# Patient Record
Sex: Female | Born: 1960 | Race: White | Hispanic: No | Marital: Married | Smoking: Never smoker
Health system: Southern US, Community
[De-identification: ages and names within clinical notes are randomized; demographics above are authoritative.]

## PROBLEM LIST (undated history)

## (undated) DIAGNOSIS — Z8669 Personal history of other diseases of the nervous system and sense organs: Secondary | ICD-10-CM

## (undated) DIAGNOSIS — I10 Essential (primary) hypertension: Secondary | ICD-10-CM

## (undated) DIAGNOSIS — K209 Esophagitis, unspecified without bleeding: Secondary | ICD-10-CM

## (undated) DIAGNOSIS — M889 Osteitis deformans of unspecified bone: Secondary | ICD-10-CM

## (undated) DIAGNOSIS — R739 Hyperglycemia, unspecified: Secondary | ICD-10-CM

## (undated) DIAGNOSIS — E7800 Pure hypercholesterolemia, unspecified: Secondary | ICD-10-CM

## (undated) DIAGNOSIS — K2970 Gastritis, unspecified, without bleeding: Secondary | ICD-10-CM

## (undated) DIAGNOSIS — K3184 Gastroparesis: Secondary | ICD-10-CM

## (undated) DIAGNOSIS — E119 Type 2 diabetes mellitus without complications: Secondary | ICD-10-CM

## (undated) HISTORY — DX: Personal history of other diseases of the nervous system and sense organs: Z86.69

## (undated) HISTORY — DX: Gastroparesis: K31.84

## (undated) HISTORY — PX: TUBAL LIGATION: SHX77

## (undated) HISTORY — PX: CERVICAL DISC SURGERY: SHX588

## (undated) HISTORY — DX: Hyperglycemia, unspecified: R73.9

## (undated) HISTORY — DX: Gastritis, unspecified, without bleeding: K29.70

## (undated) HISTORY — DX: Pure hypercholesterolemia, unspecified: E78.00

## (undated) HISTORY — DX: Esophagitis, unspecified: K20.9

## (undated) HISTORY — DX: Esophagitis, unspecified without bleeding: K20.90

## (undated) HISTORY — DX: Essential (primary) hypertension: I10

---

## 2004-06-06 ENCOUNTER — Encounter: Admission: RE | Admit: 2004-06-06 | Discharge: 2004-06-06 | Payer: Self-pay | Admitting: Family Medicine

## 2004-07-12 ENCOUNTER — Encounter: Admission: RE | Admit: 2004-07-12 | Discharge: 2004-07-12 | Payer: Self-pay | Admitting: Family Medicine

## 2004-07-26 ENCOUNTER — Encounter: Admission: RE | Admit: 2004-07-26 | Discharge: 2004-07-26 | Payer: Self-pay | Admitting: Family Medicine

## 2004-08-09 ENCOUNTER — Ambulatory Visit (HOSPITAL_COMMUNITY): Admission: RE | Admit: 2004-08-09 | Discharge: 2004-08-10 | Payer: Self-pay | Admitting: Neurosurgery

## 2004-08-31 ENCOUNTER — Other Ambulatory Visit: Payer: Self-pay

## 2004-10-09 ENCOUNTER — Ambulatory Visit: Payer: Self-pay | Admitting: Internal Medicine

## 2005-11-11 ENCOUNTER — Ambulatory Visit: Payer: Self-pay | Admitting: Internal Medicine

## 2005-11-24 ENCOUNTER — Ambulatory Visit: Payer: Self-pay | Admitting: Internal Medicine

## 2005-12-09 ENCOUNTER — Ambulatory Visit: Payer: Self-pay | Admitting: Gastroenterology

## 2006-02-02 ENCOUNTER — Ambulatory Visit: Payer: Self-pay | Admitting: Gastroenterology

## 2006-02-23 ENCOUNTER — Ambulatory Visit: Payer: Self-pay | Admitting: Internal Medicine

## 2006-05-26 ENCOUNTER — Ambulatory Visit: Payer: Self-pay | Admitting: Internal Medicine

## 2007-11-03 ENCOUNTER — Ambulatory Visit: Payer: Self-pay | Admitting: Internal Medicine

## 2007-12-01 ENCOUNTER — Ambulatory Visit: Payer: Self-pay | Admitting: Gastroenterology

## 2008-01-18 ENCOUNTER — Ambulatory Visit: Payer: Self-pay | Admitting: Internal Medicine

## 2008-03-07 ENCOUNTER — Ambulatory Visit: Payer: Self-pay | Admitting: Gastroenterology

## 2008-03-21 ENCOUNTER — Ambulatory Visit: Payer: Self-pay | Admitting: Unknown Physician Specialty

## 2008-04-24 ENCOUNTER — Ambulatory Visit: Payer: Self-pay | Admitting: Gastroenterology

## 2008-12-22 HISTORY — PX: BREAST BIOPSY: SHX20

## 2009-10-01 ENCOUNTER — Emergency Department: Payer: Self-pay | Admitting: Emergency Medicine

## 2009-10-02 ENCOUNTER — Ambulatory Visit (HOSPITAL_COMMUNITY): Admission: RE | Admit: 2009-10-02 | Discharge: 2009-10-02 | Payer: Self-pay | Admitting: Neurosurgery

## 2009-10-10 ENCOUNTER — Ambulatory Visit: Payer: Self-pay | Admitting: Internal Medicine

## 2009-11-01 ENCOUNTER — Encounter: Admission: RE | Admit: 2009-11-01 | Discharge: 2009-11-01 | Payer: Self-pay | Admitting: Neurosurgery

## 2009-11-08 ENCOUNTER — Ambulatory Visit: Payer: Self-pay | Admitting: Internal Medicine

## 2009-12-04 ENCOUNTER — Ambulatory Visit: Payer: Self-pay | Admitting: Gastroenterology

## 2009-12-07 ENCOUNTER — Ambulatory Visit: Payer: Self-pay | Admitting: Gastroenterology

## 2009-12-10 ENCOUNTER — Ambulatory Visit: Payer: Self-pay | Admitting: Gastroenterology

## 2010-07-08 ENCOUNTER — Ambulatory Visit: Payer: Self-pay | Admitting: General Surgery

## 2010-11-08 ENCOUNTER — Encounter: Admission: RE | Admit: 2010-11-08 | Discharge: 2010-11-08 | Payer: Self-pay | Admitting: Neurosurgery

## 2010-12-10 ENCOUNTER — Inpatient Hospital Stay (HOSPITAL_COMMUNITY)
Admission: RE | Admit: 2010-12-10 | Discharge: 2010-12-11 | Payer: Self-pay | Source: Home / Self Care | Attending: Neurosurgery | Admitting: Neurosurgery

## 2011-02-26 ENCOUNTER — Ambulatory Visit: Payer: Self-pay | Admitting: Internal Medicine

## 2011-03-03 LAB — CBC
MCHC: 33.3 g/dL (ref 30.0–36.0)
Platelets: 270 10*3/uL (ref 150–400)
RDW: 13.8 % (ref 11.5–15.5)

## 2011-03-03 LAB — SURGICAL PCR SCREEN
MRSA, PCR: NEGATIVE
Staphylococcus aureus: NEGATIVE

## 2011-03-03 LAB — BASIC METABOLIC PANEL
BUN: 13 mg/dL (ref 6–23)
Calcium: 10.1 mg/dL (ref 8.4–10.5)
Creatinine, Ser: 0.77 mg/dL (ref 0.4–1.2)
GFR calc non Af Amer: >60 mL/min (ref >60)
Glucose, Bld: 128 mg/dL — ABNORMAL HIGH (ref 70–99)
Sodium: 144 mEq/L (ref 135–145)

## 2011-05-09 NOTE — H&P (Signed)
NAME:  Rachel Collins, Rachel Collins                          ACCOUNT NO.:  [id number]   MEDICAL RECORD NO.:  [id number]                   PATIENT TYPE:  OIB   LOCATION:  NA                                   FACILITY:  [facility]   PHYSICIAN:  Hilda Lias, M.D.                DATE OF BIRTH:  Mar 10, 1961   DATE OF ADMISSION:  08/09/2004  DATE OF DISCHARGE:                                HISTORY & PHYSICAL   Rachel Collins is a lady who was seen in my office two days ago because of neck  pain with radiation to the left upper extremity associated with weakness.  The patient is getting worse.  She had an MRI and later on a myelogram.  Because of the findings, she was sent to [place] for evaluation.  She denies any  problems with the right upper extremity.   PAST MEDICAL HISTORY:  Negative.   SOCIAL HISTORY:  She does not smoke.  She drinks socially.   FAMILY HISTORY:  Mother is 85 with heart disease and diabetes.   REVIEW OF SYSTEMS:  Positive for high blood pressure, high cholesterol.   PHYSICAL EXAMINATION:  GENERAL:  The patient came to my office, and she was  holding her left hand against the chest wall.  This was to prevent pain.  HEENT:  Nose clear.  NECK:  She is a able to flex.  Extension causes pain going to the left  shoulder.  ABDOMEN:  Normal.  EXTREMITIES:  Normal.  NEUROLOGIC:  The strength showed that she had weakness of the left triceps  with absence of left triceps reflex.  She had numbness which involved mostly  the C7 nerve root on the left side.   LABORATORY AND X-RAY DATA:  The cervical MRI and myelogram showed that she  has a herniated disk at the level of C6-7 central to the left.  She has  spondylosis at the level of 5-6.   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   RECOMMENDATIONS:  The patient wants to proceed with surgery.  The procedure  will be anterior cervical diskectomy at C6-7 with fusion.  She knows about  the  risks such as infection, CSF leak, damage to the vocal cord, damage to  the esophageal tract, collapse of the bone graft, and need for further  surgery.                                                Hilda Lias, M.D.    EB/MEDQ  D:  08/09/2004  T:  08/09/2004  Job:  [id number]

## 2011-05-09 NOTE — Op Note (Signed)
NAME:  Rachel Collins, Rachel Collins                          ACCOUNT NO.:  [id number]   MEDICAL RECORD NO.:  [id number]                   PATIENT TYPE:  OIB   LOCATION:  NA                                   FACILITY:  [facility]   PHYSICIAN:  Hilda Lias, M.D.                DATE OF BIRTH:  03/18/1961   DATE OF PROCEDURE:  08/09/2004  DATE OF DISCHARGE:                                 OPERATIVE REPORT   PREOPERATIVE DIAGNOSIS:  C6-7 herniated disk with left cervical  radiculopathy.   POSTOPERATIVE DIAGNOSIS:  C6-7 herniated disk with left cervical  radiculopathy.   OPERATION/PROCEDURE:  C6-7 decompression of the spinal cord, total  diskectomy, foraminotomy, removal of three large fragments on the left side,  bone graft, allograft, plate, microscopic.   INDICATIONS:  The patient was admitted because of neck __________.  X-rays  showed that she has herniated disk of the lower C6-C7, centered to the left.  The patient has failed conservative treatment.  The patient wanted to  proceed with surgery and the risks were explained and she wished to proceed.   DESCRIPTION OF PROCEDURE:  The patient was taken to the OR and the left side  of the neck was prepped with Betadine.  Transverse incision through the skin  and subcutaneous tissue was made.  Dissection was carried out to the  cervical spine.  X-ray showed that we were at low C6-C7.  Anterior  osteophytes were removed.  We opened the anterior ligament and with the help  of the microscope, we did a total diskectomy.  We opened the posterior  ligament and there were three fragments going to the left side with  spondylosis.  Decompression of both C7 roots was accomplished.  At the end  having good decompression of the spinal cord, the end plates were trimmed  and allograft of 7 mm was inserted.  This was followed by a plate with four  screws.  Lateral C-spine showed good position on the bone graft.  The area  was irrigated.  Hemostasis was done with  bipolar.  The wound was closed with  Vicryl and Steri-Strips.                                               Hilda Lias, M.D.    EB/MEDQ  D:  08/09/2004  T:  08/10/2004  Job:  [id number]

## 2011-07-25 ENCOUNTER — Ambulatory Visit: Payer: Self-pay | Admitting: Anesthesiology

## 2011-07-29 ENCOUNTER — Ambulatory Visit: Payer: Self-pay | Admitting: Unknown Physician Specialty

## 2012-03-04 ENCOUNTER — Ambulatory Visit: Payer: Self-pay | Admitting: Internal Medicine

## 2012-11-12 ENCOUNTER — Encounter: Payer: Self-pay | Admitting: Internal Medicine

## 2012-11-12 ENCOUNTER — Ambulatory Visit (INDEPENDENT_AMBULATORY_CARE_PROVIDER_SITE_OTHER): Payer: PRIVATE HEALTH INSURANCE | Admitting: Internal Medicine

## 2012-11-12 VITALS — BP 146/94 | HR 90 | Temp 98.6°F | Ht 65.5 in | Wt 147.5 lb

## 2012-11-12 DIAGNOSIS — K219 Gastro-esophageal reflux disease without esophagitis: Secondary | ICD-10-CM | POA: Insufficient documentation

## 2012-11-12 DIAGNOSIS — R7309 Other abnormal glucose: Secondary | ICD-10-CM

## 2012-11-12 DIAGNOSIS — I10 Essential (primary) hypertension: Secondary | ICD-10-CM | POA: Insufficient documentation

## 2012-11-12 DIAGNOSIS — R739 Hyperglycemia, unspecified: Secondary | ICD-10-CM

## 2012-11-12 DIAGNOSIS — E1165 Type 2 diabetes mellitus with hyperglycemia: Secondary | ICD-10-CM | POA: Insufficient documentation

## 2012-11-12 DIAGNOSIS — E7800 Pure hypercholesterolemia, unspecified: Secondary | ICD-10-CM | POA: Insufficient documentation

## 2012-11-12 MED ORDER — SERTRALINE HCL 50 MG PO TABS: 50.0000 mg | ORAL_TABLET | Freq: Every day | ORAL | Status: DC

## 2012-11-12 NOTE — Patient Instructions (Signed)
It was nice seeing you today.  I am glad you are doing better.  Let me know if you need anything.  

## 2012-11-13 ENCOUNTER — Encounter: Payer: Self-pay | Admitting: Internal Medicine

## 2012-11-13 NOTE — Assessment & Plan Note (Signed)
Low cholesterol diet and exercise.  Continue Lipitor.  Check lipid panel and liver function with next labs.

## 2012-11-13 NOTE — Assessment & Plan Note (Signed)
Off Protonix.  Doing well.  Follow.    

## 2012-11-13 NOTE — Assessment & Plan Note (Signed)
Blood pressure a little elevated today.  Hold on changing meds.  Have her spot check her pressures.  Check met b.  Adjust meds if persistent elevation.  She will send in readings over the next few weeks.

## 2012-11-13 NOTE — Progress Notes (Signed)
Subjective:    Patient ID: Rachel Collins, female    DOB: May 07, 1961, 51 y.o.   MRN: [id number]  HPI 51 year old female with past history of hypertension and hypercholesterolemia.  States she is doing better.  Has had a stressful several months.  She left her husband.  Has her own apartment.  Doing well.  Feels better.  No bowel/stomach issues now.  No chest pain or tightness.  Still some hot flashes at times, but overall handling these relatively well.  Eating and drinking well.    Past Medical History  Diagnosis Date  . Hypertension   . Hypercholesterolemia   . Hyperglycemia   . Gastritis   . Esophagitis   . History of migraine headaches   . Gastroparesis     Outpatient Encounter Prescriptions as of 11/12/2012  Medication Sig Dispense Refill  . amLODipine (NORVASC) 5 MG tablet Take 5 mg by mouth daily.      Marland Kitchen atorvastatin (LIPITOR) 10 MG tablet Take 10 mg by mouth daily.      . hydrochlorothiazide (HYDRODIURIL) 25 MG tablet Take 25 mg by mouth daily.      Marland Kitchen losartan (COZAAR) 50 MG tablet Take 50 mg by mouth daily.      . sertraline (ZOLOFT) 50 MG tablet Take 1 tablet (50 mg total) by mouth daily.  30 tablet  5  . [DISCONTINUED] sertraline (ZOLOFT) 50 MG tablet Take 50 mg by mouth daily.      Marland Kitchen acetaminophen (TYLENOL) 650 MG CR tablet Take 650 mg by mouth every 8 (eight) hours as needed.      . [DISCONTINUED] mometasone (NASONEX) 50 MCG/ACT nasal spray Place 2 sprays into the nose daily.      . [DISCONTINUED] pantoprazole (PROTONIX) 40 MG tablet Take 40 mg by mouth daily.        Review of Systems Patient denies any headache, lightheadedness or dizziness.  No increased sinus or allergy symptoms.  No chest pain, tightness or palpitations.  No increased shortness of breath, cough or congestion.  No nausea or vomiting. No acid reflux.  Not on Protonix.  No abdominal pain or cramping.  No bowel change, such as diarrhea, constipation, BRBPR or melana.  No urine change.          Objective:   Physical Exam Filed Vitals:   11/12/12 1541  BP: 146/94  Pulse: 90  Temp: 98.6 F (37 C)   Blood pressure recheck:  443630/80  51 year old female in no acute distress.   HEENT:  Nares - clear.  OP- without lesions or erythema.  NECK:  Supple, nontender.  No audible bruit.   HEART:  Appears to be regular.  I/VI systolic murmur.  LUNGS:  Without crackles or wheezing audible.  Respirations even and unlabored.   RADIAL PULSE:  Equal bilaterally.  ABDOMEN:  Soft, nontender.  No audible abdominal bruit.   EXTREMITIES:  No increased edema to be present.                     Assessment & Plan:  INCREASED PSYCHOSOCIAL STRESSORS.  On Zoloft and doing well.  She feels she is handling the separation well.  Does not feel she needs any further intervention at this point.  Follow.   CARDIOVASCULAR.  Currently asymptomatic.  Continue risk factor modification.    PREVIOUS ABNORMAL MAMMOGRAM.  Saw dr Lemar Livings.  S/P biopsy.  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  Mammogram 03/04/12 - BiRADS II.  GI.  Symptoms have resolved.  Off Protonix.  Due follow up colonoscopy 2016.  Currently doing well.   MSK.  S/P fusion of C5-C7.  Doing well.  Saw Dr Jeral Fruit.   HEALTH MAINTENANCE.  Physical 01/07/12.  Mammogram 03/04/12 - BiRADS II.  Colonoscopy due 2016.

## 2012-11-13 NOTE — Assessment & Plan Note (Signed)
Low carb diet and exercise.  Follow met b and a1c.  

## 2012-11-22 ENCOUNTER — Other Ambulatory Visit: Payer: PRIVATE HEALTH INSURANCE

## 2012-11-26 ENCOUNTER — Other Ambulatory Visit (INDEPENDENT_AMBULATORY_CARE_PROVIDER_SITE_OTHER): Payer: PRIVATE HEALTH INSURANCE

## 2012-11-26 ENCOUNTER — Telehealth: Payer: Self-pay | Admitting: Internal Medicine

## 2012-11-26 DIAGNOSIS — R7309 Other abnormal glucose: Secondary | ICD-10-CM

## 2012-11-26 DIAGNOSIS — E7800 Pure hypercholesterolemia, unspecified: Secondary | ICD-10-CM

## 2012-11-26 DIAGNOSIS — I10 Essential (primary) hypertension: Secondary | ICD-10-CM

## 2012-11-26 DIAGNOSIS — R739 Hyperglycemia, unspecified: Secondary | ICD-10-CM

## 2012-11-26 LAB — BASIC METABOLIC PANEL
CO2: 24 mEq/L (ref 19–32)
Glucose, Bld: 115 mg/dL — ABNORMAL HIGH (ref 70–99)
Potassium: 4 mEq/L (ref 3.5–5.1)
Sodium: 137 mEq/L (ref 135–145)

## 2012-11-26 LAB — HEPATIC FUNCTION PANEL
AST: 22 U/L (ref 0–37)
Albumin: 4.6 g/dL (ref 3.5–5.2)
Alkaline Phosphatase: 91 U/L (ref 39–117)
Total Protein: 7.2 g/dL (ref 6.0–8.3)

## 2012-11-26 LAB — LDL CHOLESTEROL, DIRECT: Direct LDL: 156.1 mg/dL

## 2012-11-26 NOTE — Telephone Encounter (Signed)
Pt came by and says that [facility] in [place] never recevied her request for Sertraline HCL 50 mg.

## 2012-11-26 NOTE — Telephone Encounter (Signed)
We sent rx on 11/12/12 -per system.  Please check with pharmacy and confirm refill

## 2012-11-27 ENCOUNTER — Other Ambulatory Visit: Payer: Self-pay | Admitting: Internal Medicine

## 2012-11-27 ENCOUNTER — Telehealth: Payer: Self-pay | Admitting: Internal Medicine

## 2012-11-27 DIAGNOSIS — E7800 Pure hypercholesterolemia, unspecified: Secondary | ICD-10-CM

## 2012-11-27 MED ORDER — PRAVASTATIN SODIUM 10 MG PO TABS: 10.0000 mg | ORAL_TABLET | Freq: Every day | ORAL | Status: DC

## 2012-11-27 NOTE — Telephone Encounter (Signed)
Pt notified of lab results.  Notified to start pravastatin 10mg  q day.  Check liver panel 01/03/13 at 9:00.  rx sent [facility] to [facility].  Will need to put her on lab schedule.  Pt aware of appt time.  Thanks.

## 2012-11-29 NOTE — Telephone Encounter (Signed)
Pharmacy did not receive. Gave med info to pharmacist

## 2012-11-30 ENCOUNTER — Ambulatory Visit (INDEPENDENT_AMBULATORY_CARE_PROVIDER_SITE_OTHER): Payer: PRIVATE HEALTH INSURANCE | Admitting: Internal Medicine

## 2012-11-30 ENCOUNTER — Encounter: Payer: Self-pay | Admitting: Internal Medicine

## 2012-11-30 VITALS — BP 130/82 | HR 77 | Temp 99.0°F | Ht 64.0 in | Wt 145.0 lb

## 2012-11-30 DIAGNOSIS — I10 Essential (primary) hypertension: Secondary | ICD-10-CM

## 2012-11-30 MED ORDER — AZITHROMYCIN 250 MG PO TABS: ORAL_TABLET | ORAL | Status: DC

## 2012-11-30 MED ORDER — FLUTICASONE PROPIONATE 50 MCG/ACT NA SUSP: 2.0000 | Freq: Every day | NASAL | Status: DC

## 2012-11-30 NOTE — Patient Instructions (Addendum)
I want you to take the zpak and use Flonase - 2 sprays each nostril in the evening.  Flush your nose with saline - 2-3x/day.  Mucinex in the am and Robitussin in the evening.  Let me know if problems.

## 2012-12-04 ENCOUNTER — Encounter: Payer: Self-pay | Admitting: Internal Medicine

## 2012-12-04 NOTE — Assessment & Plan Note (Signed)
Blood pressure controlled.  Follow.    

## 2012-12-04 NOTE — Progress Notes (Signed)
  Subjective:    Patient ID: Rachel Collins, female    DOB: 07-Mar-1961, 51 y.o.   MRN: [id number]  HPI 51 year old female with past history of hypertension, hypercholesterolemia, GERD and hyperglycemia who comes in today as a work in with concerns regarding increased congestion, drainage and sore throat.  She has noticed increased ear fullness and increased drainage.  Head pressure.  Sore throat.  Taking Mucinex.  tmax 99 - today.  No nausea or vomiting.  No acid reflux.  Able to eat and drink.   Past Medical History  Diagnosis Date  . Hypertension   . Hypercholesterolemia   . Hyperglycemia   . Gastritis   . Esophagitis   . History of migraine headaches   . Gastroparesis     Current Outpatient Prescriptions on File Prior to Visit  Medication Sig Dispense Refill  . amLODipine (NORVASC) 5 MG tablet Take 5 mg by mouth daily.      . hydrochlorothiazide (HYDRODIURIL) 25 MG tablet Take 25 mg by mouth daily.      . sertraline (ZOLOFT) 50 MG tablet Take 1 tablet (50 mg total) by mouth daily.  30 tablet  5  . acetaminophen (TYLENOL) 650 MG CR tablet Take 650 mg by mouth every 8 (eight) hours as needed.      . fluticasone (FLONASE) 50 MCG/ACT nasal spray Place 2 sprays into the nose daily.  16 g  1  . losartan (COZAAR) 50 MG tablet Take 50 mg by mouth daily.      . pravastatin (PRAVACHOL) 10 MG tablet Take 1 tablet (10 mg total) by mouth daily.  30 tablet  3    Review of Systems Patient denies any headache, lightheadedness or dizziness.  Does report increased head congestion and ear fullness.  Increased drainage.  No chest pain, tightness or palpitations.  No increased shortness of breath.  No chest tightness.  No nausea or vomiting.  No abdominal pain or cramping.  No bowel change.         Objective:   Physical Exam Filed Vitals:   11/30/12 0932  BP: 130/82  Pulse: 77  Temp: 99 F (71.63 C)   51 year old female in no acute distress.   HEENT:  Nares - erythematous turbinates.  TMs  without erythema.  OP- without lesions or erythema. Minimal tenderness to palpation over the maxillary sinus.  NECK:  Supple, nontender.  HEART:  Appears to be regular. LUNGS:  Without crackles or wheezing audible.  Respirations even and unlabored.   RADIAL PULSE:  Equal bilaterally.      Assessment & Plan:  PROBABLE SINUSITIS.  Treat with a Zpak as directed.  Saline nasal flushes and Flonase nasal spray as directed. Mucinex in the am and Robitussin in the evening.  Rest.  Fluids.  Explained to her if symptoms changed, worsened or did not resolve - she was to be reevaluated.

## 2013-01-03 ENCOUNTER — Other Ambulatory Visit: Payer: PRIVATE HEALTH INSURANCE

## 2013-01-07 ENCOUNTER — Ambulatory Visit (INDEPENDENT_AMBULATORY_CARE_PROVIDER_SITE_OTHER): Payer: PRIVATE HEALTH INSURANCE | Admitting: Internal Medicine

## 2013-01-07 ENCOUNTER — Encounter: Payer: Self-pay | Admitting: Internal Medicine

## 2013-01-07 VITALS — BP 140/80 | HR 86 | Temp 98.9°F | Ht 64.0 in | Wt 145.5 lb

## 2013-01-07 DIAGNOSIS — I10 Essential (primary) hypertension: Secondary | ICD-10-CM

## 2013-01-07 MED ORDER — AZITHROMYCIN 250 MG PO TABS: ORAL_TABLET | ORAL | Status: DC

## 2013-01-07 MED ORDER — ALBUTEROL SULFATE HFA 108 (90 BASE) MCG/ACT IN AERS: 2.0000 | INHALATION_SPRAY | Freq: Four times a day (QID) | RESPIRATORY_TRACT | Status: DC | PRN

## 2013-01-09 ENCOUNTER — Encounter: Payer: Self-pay | Admitting: Internal Medicine

## 2013-01-09 NOTE — Assessment & Plan Note (Signed)
Slightly elevated today.  Treat her infection.  Follow.

## 2013-01-09 NOTE — Progress Notes (Signed)
  Subjective:    Patient ID: Rachel Collins, female    DOB: [birth year], 52 y.o.   MRN: [id number]  HPI 52 year old female with past history of hypertension, hypercholesterolemia, GERD and hyperglycemia who comes in today as a work in with concerns regarding increased congestion and cough.  Symptoms started several days ago.  Started with a sore throat initially.  Throat is better now.  Some chills and fever.  Tmax last night - 102.  No vomiting.  Some minimal diarrhea.  Increased cough and congestion.  No sob.  No wheezing.  No chest pain.    Past Medical History  Diagnosis Date  . Hypertension   . Hypercholesterolemia   . Hyperglycemia   . Gastritis   . Esophagitis   . History of migraine headaches   . Gastroparesis     Current Outpatient Prescriptions on File Prior to Visit  Medication Sig Dispense Refill  . acetaminophen (TYLENOL) 650 MG CR tablet Take 650 mg by mouth every 8 (eight) hours as needed.      Marland Kitchen amLODipine (NORVASC) 5 MG tablet Take 5 mg by mouth daily.      Marland Kitchen azithromycin (ZITHROMAX) 250 MG tablet Take 2 tablets x 1 day and then one tablet q day x 4 more days.  6 tablet  0  . fluticasone (FLONASE) 50 MCG/ACT nasal spray Place 2 sprays into the nose daily.  16 g  1  . hydrochlorothiazide (HYDRODIURIL) 25 MG tablet Take 25 mg by mouth daily.      Marland Kitchen losartan (COZAAR) 50 MG tablet Take 50 mg by mouth daily.      . pravastatin (PRAVACHOL) 10 MG tablet Take 1 tablet (10 mg total) by mouth daily.  30 tablet  3  . sertraline (ZOLOFT) 50 MG tablet Take 1 tablet (50 mg total) by mouth daily.  30 tablet  5  . albuterol (PROVENTIL HFA;VENTOLIN HFA) 108 (90 BASE) MCG/ACT inhaler Inhale 2 puffs into the lungs every 6 (six) hours as needed for wheezing.  1 Inhaler  0    Review of Systems Patient denies any headache, lightheadedness or dizziness.  No significant sinus symptoms.  Does report the increased cough and congestion.  Fever.  No chest pain, tightness or palpitations.  No  increased shortness of breath.  No chest tightness.  No nausea or vomiting.  No abdominal pain or cramping.  No bowel change.         Objective:   Physical Exam  Filed Vitals:   01/07/13 1017  BP: 140/80  Pulse: 86  Temp: 98.9 F (29.78 C)   52 year old female in no acute distress.   HEENT:  Nares - erythematous turbinates.  TMs without erythema.  OP- without lesions or erythema. NECK:  Supple, nontender.  HEART:  Appears to be regular. LUNGS:  Without crackles or wheezing audible.  Respirations even and unlabored.   RADIAL PULSE:  Equal bilaterally.      Assessment & Plan:  PROBABLE URI.  Treat with a Zpak as directed.  Saline nasal flushes and Flonase nasal spray as directed. Mucinex in the am and Robitussin in the evening.  Rest.  Fluids.  Albuterol inhaler as directed.  Explained to her if symptoms changed, worsened or did not resolve - she was to be reevaluated.

## 2013-01-24 ENCOUNTER — Encounter: Payer: PRIVATE HEALTH INSURANCE | Admitting: Internal Medicine

## 2013-02-24 ENCOUNTER — Telehealth: Payer: Self-pay | Admitting: Internal Medicine

## 2013-02-24 MED ORDER — AMLODIPINE BESYLATE 5 MG PO TABS: 5.0000 mg | ORAL_TABLET | Freq: Every day | ORAL | Status: DC

## 2013-02-24 NOTE — Telephone Encounter (Signed)
amLODipine (NORVASC) 5 MG tablet   #30

## 2013-02-24 NOTE — Telephone Encounter (Signed)
Sent in to pharmacy.  

## 2013-03-21 ENCOUNTER — Encounter: Payer: Self-pay | Admitting: Internal Medicine

## 2013-03-21 ENCOUNTER — Ambulatory Visit (INDEPENDENT_AMBULATORY_CARE_PROVIDER_SITE_OTHER): Payer: PRIVATE HEALTH INSURANCE | Admitting: Internal Medicine

## 2013-03-21 VITALS — BP 130/88 | HR 72 | Temp 98.4°F | Ht 64.0 in | Wt 145.0 lb

## 2013-03-21 DIAGNOSIS — R7309 Other abnormal glucose: Secondary | ICD-10-CM

## 2013-03-21 DIAGNOSIS — Z1239 Encounter for other screening for malignant neoplasm of breast: Secondary | ICD-10-CM

## 2013-03-21 DIAGNOSIS — K219 Gastro-esophageal reflux disease without esophagitis: Secondary | ICD-10-CM

## 2013-03-21 DIAGNOSIS — E7800 Pure hypercholesterolemia, unspecified: Secondary | ICD-10-CM

## 2013-03-21 DIAGNOSIS — R739 Hyperglycemia, unspecified: Secondary | ICD-10-CM

## 2013-03-21 DIAGNOSIS — I10 Essential (primary) hypertension: Secondary | ICD-10-CM

## 2013-03-21 MED ORDER — AMLODIPINE BESYLATE 5 MG PO TABS: 5.0000 mg | ORAL_TABLET | Freq: Every day | ORAL | Status: DC

## 2013-03-21 MED ORDER — LOSARTAN POTASSIUM 50 MG PO TABS: 50.0000 mg | ORAL_TABLET | Freq: Every day | ORAL | Status: DC

## 2013-03-21 MED ORDER — HYDROCHLOROTHIAZIDE 25 MG PO TABS: 25.0000 mg | ORAL_TABLET | Freq: Every day | ORAL | Status: DC

## 2013-03-22 ENCOUNTER — Encounter: Payer: Self-pay | Admitting: *Deleted

## 2013-03-22 ENCOUNTER — Encounter: Payer: Self-pay | Admitting: Internal Medicine

## 2013-03-22 NOTE — Assessment & Plan Note (Signed)
Off Protonix.  Doing well.  Follow.    

## 2013-03-22 NOTE — Progress Notes (Signed)
Subjective:    Patient ID: Rachel Collins, female    DOB: 05/05/1961, 52 y.o.   MRN: [id number]  HPI 52 year old female with past history of hypertension and hypercholesterolemia.  She comes in today to follow up on these issues as well as for a complete physical exam.  States she is doing well.  Living in one of her rental houses now.  Feels much better.  Her and her husband are separated.  Doing much better.  No abdominal pain or cramping.  No bowel change.  No hot flashes.  No chest pain or tightness.  Breathing stable.  Some allergy symptoms, but took otc mucinex and this worked.  Overall feels good.       Past Medical History  Diagnosis Date  . Hypertension   . Hypercholesterolemia   . Hyperglycemia   . Gastritis   . Esophagitis   . History of migraine headaches   . Gastroparesis     Outpatient Encounter Prescriptions as of 03/21/2013  Medication Sig Dispense Refill  . amLODipine (NORVASC) 5 MG tablet Take 1 tablet (5 mg total) by mouth daily.  30 tablet  5  . hydrochlorothiazide (HYDRODIURIL) 25 MG tablet Take 1 tablet (25 mg total) by mouth daily.  30 tablet  5  . losartan (COZAAR) 50 MG tablet Take 1 tablet (50 mg total) by mouth daily.  30 tablet  5  . pravastatin (PRAVACHOL) 10 MG tablet Take 1 tablet (10 mg total) by mouth daily.  30 tablet  3  . sertraline (ZOLOFT) 50 MG tablet Take 1 tablet (50 mg total) by mouth daily.  30 tablet  5  . [DISCONTINUED] amLODipine (NORVASC) 5 MG tablet Take 1 tablet (5 mg total) by mouth daily.  30 tablet  5  . [DISCONTINUED] hydrochlorothiazide (HYDRODIURIL) 25 MG tablet Take 25 mg by mouth daily.      . [DISCONTINUED] losartan (COZAAR) 50 MG tablet Take 50 mg by mouth daily.      Marland Kitchen acetaminophen (TYLENOL) 650 MG CR tablet Take 650 mg by mouth every 8 (eight) hours as needed.      Marland Kitchen albuterol (PROVENTIL HFA;VENTOLIN HFA) 108 (90 BASE) MCG/ACT inhaler Inhale 2 puffs into the lungs every 6 (six) hours as needed for wheezing.  1 Inhaler  0  .  azithromycin (ZITHROMAX) 250 MG tablet Take 2 tablets x 1 day and then one tablet q day x 4 more days.  6 tablet  0  . azithromycin (ZITHROMAX) 250 MG tablet Take 2 tablets x 1 day and then one tablet q day for the next 4 days.  6 tablet  0  . fluticasone (FLONASE) 50 MCG/ACT nasal spray Place 2 sprays into the nose daily.  16 g  1   No facility-administered encounter medications on file as of 03/21/2013.    Review of Systems Patient denies any headache, lightheadedness or dizziness.  No increased sinus or allergy symptoms now.  Took mucinex.  Helped.   No chest pain, tightness or palpitations.  No increased shortness of breath, cough or congestion.  No nausea or vomiting. No acid reflux.  Not on Protonix.  No abdominal pain or cramping.  No bowel change, such as diarrhea, constipation, BRBPR or melana.  No urine change.  Feels good.  Blood pressure has been doing well on outside checks.  Still with some increased stress with her job.  Feels she is handling things well.      Objective:   Physical Exam  Filed  Vitals:   03/21/13 1506  BP: 130/88  Pulse: 72  Temp: 98.4 F (8.75 C)   52 year old female in no acute distress.   HEENT:  Nares- clear.  Oropharynx - without lesions. NECK:  Supple.  Nontender.  No audible bruit.  HEART:  Appears to be regular. LUNGS:  No crackles or wheezing audible.  Respirations even and unlabored.  RADIAL PULSE:  Equal bilaterally.    BREASTS:  No nipple discharge or nipple retraction present.  Could not appreciate any distinct nodules or axillary adenopathy.  ABDOMEN:  Soft, nontender.  Bowel sounds present and normal.  No audible abdominal bruit.  GU:  Normal external genitalia.  Vaginal vault without lesions.  Cervix identified.  No lesions.  Pap not performed. Could not appreciate any adnexal masses or tenderness.   RECTAL:  Heme negative.   EXTREMITIES:  No increased edema present.  DP pulses palpable and equal bilaterally.             Assessment &  Plan:  INCREASED PSYCHOSOCIAL STRESSORS.  On Zoloft and doing well.  She feels she is handling the separation well.  Does not feel she needs any further intervention at this point.  Follow.   CARDIOVASCULAR.  Currently asymptomatic.  Continue risk factor modification.    PREVIOUS ABNORMAL MAMMOGRAM.  Saw dr Lemar Livings.  S/P biopsy.  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  Mammogram 03/04/12 - BiRADS II.  Schedule a follow up mammogram.    GI.  Symptoms have resolved.  Off Protonix.  Due follow up colonoscopy 2016.  Currently doing well.   MSK.  S/P fusion of C5-C7.  Doing well.  Saw Dr Jeral Fruit.   HEALTH MAINTENANCE.  Physical today.  Mammogram 03/04/12 - BiRADS II.  Schedule a follow up mammogram.  Colonoscopy due 2016.

## 2013-03-22 NOTE — Assessment & Plan Note (Signed)
Low carb diet and exercise.  Follow met b and a1c.  

## 2013-03-22 NOTE — Assessment & Plan Note (Signed)
Blood pressure slightly elevated today.  States outside checks are under good control.  Will have her spot check her pressures and send in readings over the next few weeks.  Follow closely.  If persistent elevation, will need to adjust medications.

## 2013-03-22 NOTE — Assessment & Plan Note (Signed)
Low cholesterol diet and exercise.  On pravastatin now.  Check lipid panel and liver function with next labs.

## 2013-03-29 ENCOUNTER — Other Ambulatory Visit (INDEPENDENT_AMBULATORY_CARE_PROVIDER_SITE_OTHER): Payer: PRIVATE HEALTH INSURANCE

## 2013-03-29 DIAGNOSIS — R739 Hyperglycemia, unspecified: Secondary | ICD-10-CM

## 2013-03-29 DIAGNOSIS — R7309 Other abnormal glucose: Secondary | ICD-10-CM

## 2013-03-29 DIAGNOSIS — E7800 Pure hypercholesterolemia, unspecified: Secondary | ICD-10-CM

## 2013-03-29 DIAGNOSIS — I10 Essential (primary) hypertension: Secondary | ICD-10-CM

## 2013-03-29 LAB — CBC WITH DIFFERENTIAL/PLATELET
Basophils Relative: 1 % (ref 0.0–3.0)
Eosinophils Relative: 3 % (ref 0.0–5.0)
Hemoglobin: 14 g/dL (ref 12.0–15.0)
MCV: 87.4 fl (ref 78.0–100.0)
Monocytes Absolute: 0.6 10*3/uL (ref 0.1–1.0)
Neutro Abs: 3.2 10*3/uL (ref 1.4–7.7)
Neutrophils Relative %: 59.1 % (ref 43.0–77.0)
RBC: 4.65 Mil/uL (ref 3.87–5.11)
WBC: 5.4 10*3/uL (ref 4.5–10.5)

## 2013-03-29 LAB — BASIC METABOLIC PANEL
BUN: 19 mg/dL (ref 6–23)
Chloride: 104 mEq/L (ref 96–112)
Glucose, Bld: 104 mg/dL — ABNORMAL HIGH (ref 70–99)
Potassium: 4.7 mEq/L (ref 3.5–5.1)

## 2013-03-29 LAB — LIPID PANEL
HDL: 52 mg/dL (ref >39.00)
Total CHOL/HDL Ratio: 4
VLDL: 17.2 mg/dL (ref 0.0–40.0)

## 2013-03-29 LAB — HEPATIC FUNCTION PANEL: Total Bilirubin: 0.6 mg/dL (ref 0.3–1.2)

## 2013-03-29 LAB — HEMOGLOBIN A1C: Hgb A1c MFr Bld: 6 % (ref 4.6–6.5)

## 2013-03-30 ENCOUNTER — Encounter: Payer: Self-pay | Admitting: Internal Medicine

## 2013-03-31 NOTE — Telephone Encounter (Signed)
Please close encounter

## 2013-04-22 ENCOUNTER — Other Ambulatory Visit: Payer: Self-pay | Admitting: *Deleted

## 2013-04-22 ENCOUNTER — Telehealth: Payer: Self-pay | Admitting: Internal Medicine

## 2013-04-22 MED ORDER — TRIAMCINOLONE ACETONIDE 0.1 % EX CREA: TOPICAL_CREAM | Freq: Two times a day (BID) | CUTANEOUS | Status: DC

## 2013-04-22 NOTE — Telephone Encounter (Signed)
Pt informed of meds to try a RX sent in electronically

## 2013-04-22 NOTE — Telephone Encounter (Signed)
Pt informed of the TAC Cream & otc antihistamines. TAC cream sent in pharmacy electronically

## 2013-04-22 NOTE — Telephone Encounter (Signed)
notfiy pt I can see her this pm.  Just come on over.  May have to wait

## 2013-04-22 NOTE — Telephone Encounter (Signed)
Pt was pulling up poison oak last week and now has blisters all over.   Has at least 10 and everyday another is popping up.  Wondering if you can call her in something.  Please advise.

## 2013-04-22 NOTE — Telephone Encounter (Signed)
Pt can't not come in this afternoon she the [facility].

## 2013-04-22 NOTE — Telephone Encounter (Signed)
She can try triamcinolone cream .1% apply to affected area bid (avoid face and vaginal area).  Also, if increased itching - can take an antihistamine (zyrtec or claritin) q day.  If she desires, I can see her Monday at 8:45 for this.

## 2013-07-26 ENCOUNTER — Ambulatory Visit: Payer: PRIVATE HEALTH INSURANCE | Admitting: Internal Medicine

## 2013-08-08 ENCOUNTER — Other Ambulatory Visit: Payer: Self-pay | Admitting: *Deleted

## 2013-08-08 MED ORDER — PRAVASTATIN SODIUM 10 MG PO TABS: 10.0000 mg | ORAL_TABLET | Freq: Every day | ORAL | Status: DC

## 2013-08-09 ENCOUNTER — Ambulatory Visit (INDEPENDENT_AMBULATORY_CARE_PROVIDER_SITE_OTHER): Payer: PRIVATE HEALTH INSURANCE | Admitting: Internal Medicine

## 2013-08-09 ENCOUNTER — Encounter: Payer: Self-pay | Admitting: Internal Medicine

## 2013-08-09 VITALS — BP 130/100 | HR 69 | Temp 98.6°F | Ht 64.0 in | Wt 141.5 lb

## 2013-08-09 DIAGNOSIS — I10 Essential (primary) hypertension: Secondary | ICD-10-CM

## 2013-08-09 DIAGNOSIS — R7309 Other abnormal glucose: Secondary | ICD-10-CM

## 2013-08-09 DIAGNOSIS — E7800 Pure hypercholesterolemia, unspecified: Secondary | ICD-10-CM

## 2013-08-09 DIAGNOSIS — K219 Gastro-esophageal reflux disease without esophagitis: Secondary | ICD-10-CM

## 2013-08-09 DIAGNOSIS — R739 Hyperglycemia, unspecified: Secondary | ICD-10-CM

## 2013-08-09 MED ORDER — LOSARTAN POTASSIUM 100 MG PO TABS: 100.0000 mg | ORAL_TABLET | Freq: Every day | ORAL | Status: DC

## 2013-08-11 ENCOUNTER — Encounter: Payer: Self-pay | Admitting: Internal Medicine

## 2013-08-11 NOTE — Assessment & Plan Note (Signed)
Low cholesterol diet and exercise.  On pravastatin.  Check lipid panel and liver function with next labs.

## 2013-08-11 NOTE — Assessment & Plan Note (Signed)
Off Protonix.  Doing well.  Follow.    

## 2013-08-11 NOTE — Progress Notes (Signed)
Subjective:    Patient ID: Rachel Collins, female    DOB: 17-Jan-1961, 53 y.o.   MRN: [id number]  HPI 52 year old female with past history of hypertension and hypercholesterolemia.  She comes in today for a scheduled follow up.  States she is doing well.  Living in one of her rental houses now.  Feels much better.  Her and her husband are separated.  He just called her today (right before coming in) about the divorce.  She feels this is why her blood pressure is elevated.  She does state that her blood pressure has been staying 80-90 diastolic range.  No abdominal pain or cramping.  No bowel change.  No hot flashes.  No chest pain or tightness.  Breathing stable.  She does report that her tongue has a white coating.  Some burning.  Also has a tickle in her throat.  No sore throat.       Past Medical History  Diagnosis Date  . Hypertension   . Hypercholesterolemia   . Hyperglycemia   . Gastritis   . Esophagitis   . History of migraine headaches   . Gastroparesis     Outpatient Encounter Prescriptions as of 08/09/2013  Medication Sig Dispense Refill  . acetaminophen (TYLENOL) 650 MG CR tablet Take 650 mg by mouth every 8 (eight) hours as needed.      Marland Kitchen albuterol (PROVENTIL HFA;VENTOLIN HFA) 108 (90 BASE) MCG/ACT inhaler Inhale 2 puffs into the lungs every 6 (six) hours as needed for wheezing.  1 Inhaler  0  . amLODipine (NORVASC) 5 MG tablet Take 1 tablet (5 mg total) by mouth daily.  30 tablet  5  . fluticasone (FLONASE) 50 MCG/ACT nasal spray Place 2 sprays into the nose daily.  16 g  1  . hydrochlorothiazide (HYDRODIURIL) 25 MG tablet Take 1 tablet (25 mg total) by mouth daily.  30 tablet  5  . losartan (COZAAR) 100 MG tablet Take 1 tablet (100 mg total) by mouth daily.  30 tablet  2  . pravastatin (PRAVACHOL) 10 MG tablet Take 1 tablet (10 mg total) by mouth daily.  30 tablet  5  . sertraline (ZOLOFT) 50 MG tablet Take 1 tablet (50 mg total) by mouth daily.  30 tablet  5  .  [DISCONTINUED] losartan (COZAAR) 100 MG tablet Take 100 mg by mouth daily.      . [DISCONTINUED] losartan (COZAAR) 50 MG tablet Take 1 tablet (50 mg total) by mouth daily.  30 tablet  5  . [DISCONTINUED] azithromycin (ZITHROMAX) 250 MG tablet Take 2 tablets x 1 day and then one tablet q day x 4 more days.  6 tablet  0  . [DISCONTINUED] azithromycin (ZITHROMAX) 250 MG tablet Take 2 tablets x 1 day and then one tablet q day for the next 4 days.  6 tablet  0  . [DISCONTINUED] triamcinolone cream (KENALOG) 0.1 % Apply topically 2 (two) times daily. Apply to affected areas BID. Avoid face and vaginal area  30 g  0   No facility-administered encounter medications on file as of 08/09/2013.    Review of Systems Patient denies any headache, lightheadedness or dizziness.  No increased sinus or allergy symptoms now.  Tickling in her throat and coated tongue as outlined.  No chest pain, tightness or palpitations.  No increased shortness of breath, cough or congestion.  No nausea or vomiting. No acid reflux.  Not on Protonix.  No abdominal pain or cramping.  No bowel  change, such as diarrhea, constipation, BRBPR or melana.  No urine change.  Feels good.  Blood pressure as outlined.   Still with some increased stress with her job.  Also some family stress.  Feels she is handling things relatively well.      Objective:   Physical Exam  Filed Vitals:   08/09/13 1524  BP: 130/100  Pulse: 69  Temp: 98.6 F (37 C)   Blood pressure recheck:  70/35  52 year old female in no acute distress.   HEENT:  Nares- clear.  Oropharynx - white coating on her tongue.  Appears to be consistent with thrush.  NECK:  Supple.  Nontender.  No audible bruit.  HEART:  Appears to be regular. LUNGS:  No crackles or wheezing audible.  Respirations even and unlabored.  RADIAL PULSE:  Equal bilaterally.  ABDOMEN:  Soft, nontender.  Bowel sounds present and normal.  No audible abdominal bruit.    EXTREMITIES:  No increased edema  present.  DP pulses palpable and equal bilaterally.             Assessment & Plan:  INCREASED PSYCHOSOCIAL STRESSORS.  On Zoloft and doing well.  She feels she is handling the separation well.  Does not feel she needs any further intervention at this point.  Follow.   CARDIOVASCULAR.  Currently asymptomatic.  Continue risk factor modification.    PREVIOUS ABNORMAL MAMMOGRAM.  Saw dr Lemar Livings.  S/P biopsy.  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  Mammogram 03/04/12 - BiRADS II.  Was scheduled a follow up mammogram.  Had to reschedule.  Has not had yet.  Follow.   GI.  Symptoms have resolved.  Off Protonix.  Due follow up colonoscopy 2016.  Currently doing well.   MSK.  S/P fusion of C5-C7.  Doing well.  Saw Dr Jeral Fruit.   HEALTH MAINTENANCE.  Physical 03/21/13.  Mammogram 03/04/12 - BiRADS II.  Schedule a follow up mammogram.   See above.  Colonoscopy due 2016.

## 2013-08-11 NOTE — Assessment & Plan Note (Signed)
Low carb diet and exercise.  Follow met b and a1c.  

## 2013-08-11 NOTE — Assessment & Plan Note (Signed)
Blood pressure elevated today.  Outside checks as outlined.  Will increase losartan to 100mg  q day.  Will have her spot check her pressures and send in readings over the next few weeks.  Follow closely.  Get her back in soon to reassess.  Check metabolic panel.

## 2013-08-18 ENCOUNTER — Telehealth: Payer: Self-pay | Admitting: Internal Medicine

## 2013-08-18 MED ORDER — FIRST-DUKES MOUTHWASH MT SUSP: 10.0000 mL | Freq: Three times a day (TID) | OROMUCOSAL | Status: DC | PRN

## 2013-08-18 NOTE — Telephone Encounter (Signed)
Not sure of the medication (not on meds list)

## 2013-08-18 NOTE — Telephone Encounter (Signed)
Dukes Magic Mouthwash -  10cc's swish and spit tid.  8 ounces.  No refills.

## 2013-08-18 NOTE — Telephone Encounter (Signed)
States a Mouthwash was prescribed last week.  [place] and is asking if it can be called in.  Does not know name of it.

## 2013-08-18 NOTE — Telephone Encounter (Signed)
Sent new Rx electronically

## 2013-08-30 ENCOUNTER — Other Ambulatory Visit (INDEPENDENT_AMBULATORY_CARE_PROVIDER_SITE_OTHER): Payer: PRIVATE HEALTH INSURANCE

## 2013-08-30 DIAGNOSIS — E7800 Pure hypercholesterolemia, unspecified: Secondary | ICD-10-CM

## 2013-08-30 DIAGNOSIS — R739 Hyperglycemia, unspecified: Secondary | ICD-10-CM

## 2013-08-30 DIAGNOSIS — R7309 Other abnormal glucose: Secondary | ICD-10-CM

## 2013-08-30 DIAGNOSIS — I10 Essential (primary) hypertension: Secondary | ICD-10-CM

## 2013-08-30 LAB — BASIC METABOLIC PANEL
BUN: 20 mg/dL (ref 6–23)
CO2: 28 mEq/L (ref 19–32)
Calcium: 9.5 mg/dL (ref 8.4–10.5)
GFR: 84.36 mL/min (ref >60.00)
Glucose, Bld: 100 mg/dL — ABNORMAL HIGH (ref 70–99)
Sodium: 141 mEq/L (ref 135–145)

## 2013-08-30 LAB — HEPATIC FUNCTION PANEL
AST: 23 U/L (ref 0–37)
Albumin: 4.6 g/dL (ref 3.5–5.2)
Total Protein: 7.1 g/dL (ref 6.0–8.3)

## 2013-08-30 LAB — HEMOGLOBIN A1C: Hgb A1c MFr Bld: 6.3 % (ref 4.6–6.5)

## 2013-08-30 LAB — LIPID PANEL: HDL: 60.4 mg/dL (ref >39.00)

## 2013-09-06 ENCOUNTER — Ambulatory Visit: Payer: PRIVATE HEALTH INSURANCE | Admitting: Internal Medicine

## 2013-09-07 ENCOUNTER — Other Ambulatory Visit: Payer: Self-pay | Admitting: *Deleted

## 2013-09-07 MED ORDER — PRAVASTATIN SODIUM 20 MG PO TABS: 20.0000 mg | ORAL_TABLET | Freq: Every day | ORAL | Status: DC

## 2013-09-07 NOTE — Telephone Encounter (Signed)
Increased Pravastatin to 20mg  daily-pt notified

## 2013-09-20 ENCOUNTER — Encounter: Payer: Self-pay | Admitting: Internal Medicine

## 2013-09-20 ENCOUNTER — Ambulatory Visit (INDEPENDENT_AMBULATORY_CARE_PROVIDER_SITE_OTHER): Payer: PRIVATE HEALTH INSURANCE | Admitting: Internal Medicine

## 2013-09-20 VITALS — BP 110/80 | HR 70 | Temp 98.5°F | Ht 64.0 in | Wt 145.5 lb

## 2013-09-20 DIAGNOSIS — E7800 Pure hypercholesterolemia, unspecified: Secondary | ICD-10-CM

## 2013-09-20 DIAGNOSIS — R739 Hyperglycemia, unspecified: Secondary | ICD-10-CM

## 2013-09-20 DIAGNOSIS — R7309 Other abnormal glucose: Secondary | ICD-10-CM

## 2013-09-20 DIAGNOSIS — K219 Gastro-esophageal reflux disease without esophagitis: Secondary | ICD-10-CM

## 2013-09-20 DIAGNOSIS — I10 Essential (primary) hypertension: Secondary | ICD-10-CM

## 2013-09-20 DIAGNOSIS — Z23 Encounter for immunization: Secondary | ICD-10-CM

## 2013-09-20 NOTE — Assessment & Plan Note (Signed)
Blood pressure as outlined.  Continue current medication regimen.  Follow metabolic panel.    

## 2013-09-20 NOTE — Progress Notes (Signed)
Subjective:    Patient ID: Rachel Collins, female    DOB: 12/18/1961, 52 y.o.   MRN: [id number]  HPI 52 year old female with past history of hypertension and hypercholesterolemia.  She comes in today for a scheduled follow up.  States she is doing well.   Feels much better.  Her and her husband are separated.  No abdominal pain or cramping.  No bowel change.  No hot flashes.  No chest pain or tightness.  Breathing stable.  Tolerating her blood pressure medication change.  Blood pressure averaging 130/85-95.        Past Medical History  Diagnosis Date  . Hypertension   . Hypercholesterolemia   . Hyperglycemia   . Gastritis   . Esophagitis   . History of migraine headaches   . Gastroparesis     Outpatient Encounter Prescriptions as of 09/20/2013  Medication Sig Dispense Refill  . acetaminophen (TYLENOL) 650 MG CR tablet Take 650 mg by mouth every 8 (eight) hours as needed.      Marland Kitchen albuterol (PROVENTIL HFA;VENTOLIN HFA) 108 (90 BASE) MCG/ACT inhaler Inhale 2 puffs into the lungs every 6 (six) hours as needed for wheezing.  1 Inhaler  0  . amLODipine (NORVASC) 5 MG tablet Take 1 tablet (5 mg total) by mouth daily.  30 tablet  5  . fluticasone (FLONASE) 50 MCG/ACT nasal spray Place 2 sprays into the nose daily.  16 g  1  . hydrochlorothiazide (HYDRODIURIL) 25 MG tablet Take 1 tablet (25 mg total) by mouth daily.  30 tablet  5  . losartan (COZAAR) 100 MG tablet Take 1 tablet (100 mg total) by mouth daily.  30 tablet  2  . pravastatin (PRAVACHOL) 20 MG tablet Take 1 tablet (20 mg total) by mouth daily.  30 tablet  5  . sertraline (ZOLOFT) 50 MG tablet Take 1 tablet (50 mg total) by mouth daily.  30 tablet  5  . [DISCONTINUED] Diphenhyd-Hydrocort-Nystatin (FIRST-DUKES MOUTHWASH) SUSP Use as directed 10 mLs in the mouth or throat 3 (three) times daily as needed.  237 mL  0   No facility-administered encounter medications on file as of 09/20/2013.    Review of Systems Patient denies any  headache, lightheadedness or dizziness.  No increased sinus or allergy symptoms now.   No chest pain, tightness or palpitations.  No increased shortness of breath, cough or congestion.  No nausea or vomiting. No acid reflux.  Not on Protonix.  No abdominal pain or cramping.  No bowel change, such as diarrhea, constipation, BRBPR or melana.  No urine change.  Feels good.  Blood pressure as outlined.   Still with some increased stress with her job.  Also some family stress.  Feels she is handling things relatively well.      Objective:   Physical Exam  Filed Vitals:   09/20/13 1357  BP: 110/80  Pulse: 70  Temp: 98.5 F (36.9 C)   Blood pressure recheck:  58/72  52 year old female in no acute distress.   HEENT:  Nares- clear.  Oropharynx - without lesions.  NECK:  Supple.  Nontender.  No audible bruit.  HEART:  Appears to be regular. LUNGS:  No crackles or wheezing audible.  Respirations even and unlabored.  RADIAL PULSE:  Equal bilaterally.  ABDOMEN:  Soft, nontender.  Bowel sounds present and normal.  No audible abdominal bruit.    EXTREMITIES:  No increased edema present.  DP pulses palpable and equal bilaterally.  Assessment & Plan:  INCREASED PSYCHOSOCIAL STRESSORS.  On Zoloft and doing well.  She feels she is handling the separation well.  Does not feel she needs any further intervention at this point.  Follow.   CARDIOVASCULAR.  Currently asymptomatic.  Continue risk factor modification.    PREVIOUS ABNORMAL MAMMOGRAM.  Saw dr Lemar Livings.  S/P biopsy.  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  Mammogram 03/04/12 - BiRADS II.  Was scheduled a follow up mammogram.  Had to reschedule.  Has not had yet.  Follow.   GI.  Symptoms have resolved.  Off Protonix.  Due follow up colonoscopy 2016.  Currently doing well.   MSK.  S/P fusion of C5-C7.  Doing well.  Saw Dr Jeral Fruit.   HEALTH MAINTENANCE.  Physical 03/21/13.  Mammogram 03/04/12 - BiRADS II.   Needs her  follow up mammogram.  See last note for details.  Colonoscopy due 2016.

## 2013-09-20 NOTE — Assessment & Plan Note (Addendum)
Low cholesterol diet and exercise.  On pravastatin.  Follow lipid panel and liver function.    

## 2013-09-20 NOTE — Assessment & Plan Note (Signed)
Off Protonix.  Doing well.  Follow.    

## 2013-09-20 NOTE — Assessment & Plan Note (Signed)
Low carb diet and exercise.  Follow met b and a1c.  

## 2013-10-26 ENCOUNTER — Emergency Department: Payer: Self-pay | Admitting: Emergency Medicine

## 2013-11-05 ENCOUNTER — Other Ambulatory Visit: Payer: Self-pay | Admitting: Internal Medicine

## 2013-11-21 ENCOUNTER — Other Ambulatory Visit: Payer: Self-pay | Admitting: Internal Medicine

## 2013-12-06 ENCOUNTER — Other Ambulatory Visit: Payer: Self-pay | Admitting: Internal Medicine

## 2013-12-13 ENCOUNTER — Other Ambulatory Visit (INDEPENDENT_AMBULATORY_CARE_PROVIDER_SITE_OTHER): Payer: PRIVATE HEALTH INSURANCE

## 2013-12-13 DIAGNOSIS — R739 Hyperglycemia, unspecified: Secondary | ICD-10-CM

## 2013-12-13 DIAGNOSIS — R7309 Other abnormal glucose: Secondary | ICD-10-CM

## 2013-12-13 DIAGNOSIS — E7800 Pure hypercholesterolemia, unspecified: Secondary | ICD-10-CM

## 2013-12-13 DIAGNOSIS — I10 Essential (primary) hypertension: Secondary | ICD-10-CM

## 2013-12-13 LAB — BASIC METABOLIC PANEL
CO2: 28 mEq/L (ref 19–32)
Creatinine, Ser: 0.6 mg/dL (ref 0.4–1.2)
GFR: 102.75 mL/min (ref >60.00)
Glucose, Bld: 110 mg/dL — ABNORMAL HIGH (ref 70–99)
Potassium: 4.3 mEq/L (ref 3.5–5.1)
Sodium: 138 mEq/L (ref 135–145)

## 2013-12-13 LAB — HEPATIC FUNCTION PANEL
ALT: 28 U/L (ref 0–35)
Albumin: 4.6 g/dL (ref 3.5–5.2)
Alkaline Phosphatase: 89 U/L (ref 39–117)
Total Protein: 6.9 g/dL (ref 6.0–8.3)

## 2013-12-13 LAB — LIPID PANEL
Cholesterol: 192 mg/dL (ref 0–200)
HDL: 55.2 mg/dL (ref >39.00)
Total CHOL/HDL Ratio: 3
Triglycerides: 115 mg/dL (ref 0.0–149.0)
VLDL: 23 mg/dL (ref 0.0–40.0)

## 2013-12-16 LAB — HEMOGLOBIN A1C: Hgb A1c MFr Bld: 6.1 % (ref 4.6–6.5)

## 2013-12-19 ENCOUNTER — Encounter: Payer: Self-pay | Admitting: *Deleted

## 2013-12-20 ENCOUNTER — Encounter: Payer: Self-pay | Admitting: Internal Medicine

## 2013-12-20 ENCOUNTER — Ambulatory Visit (INDEPENDENT_AMBULATORY_CARE_PROVIDER_SITE_OTHER): Payer: PRIVATE HEALTH INSURANCE | Admitting: Internal Medicine

## 2013-12-20 ENCOUNTER — Encounter: Payer: Self-pay | Admitting: *Deleted

## 2013-12-20 VITALS — BP 130/80 | HR 81 | Temp 98.3°F | Ht 64.0 in | Wt 149.5 lb

## 2013-12-20 DIAGNOSIS — E7800 Pure hypercholesterolemia, unspecified: Secondary | ICD-10-CM

## 2013-12-20 DIAGNOSIS — J069 Acute upper respiratory infection, unspecified: Secondary | ICD-10-CM

## 2013-12-20 DIAGNOSIS — R7309 Other abnormal glucose: Secondary | ICD-10-CM

## 2013-12-20 DIAGNOSIS — Z1239 Encounter for other screening for malignant neoplasm of breast: Secondary | ICD-10-CM

## 2013-12-20 DIAGNOSIS — R739 Hyperglycemia, unspecified: Secondary | ICD-10-CM

## 2013-12-20 DIAGNOSIS — K148 Other diseases of tongue: Secondary | ICD-10-CM

## 2013-12-20 DIAGNOSIS — K219 Gastro-esophageal reflux disease without esophagitis: Secondary | ICD-10-CM

## 2013-12-20 DIAGNOSIS — I10 Essential (primary) hypertension: Secondary | ICD-10-CM

## 2013-12-20 MED ORDER — AZITHROMYCIN 250 MG PO TABS: ORAL_TABLET | ORAL | Status: DC

## 2013-12-20 MED ORDER — CLOTRIMAZOLE 10 MG MT TROC: 10.0000 mg | Freq: Three times a day (TID) | OROMUCOSAL | Status: DC

## 2013-12-20 NOTE — Progress Notes (Signed)
Pre-visit discussion using our clinic review tool. No additional management support is needed unless otherwise documented below in the visit note.  

## 2013-12-23 ENCOUNTER — Encounter: Payer: Self-pay | Admitting: Internal Medicine

## 2013-12-23 DIAGNOSIS — K148 Other diseases of tongue: Secondary | ICD-10-CM | POA: Insufficient documentation

## 2013-12-23 DIAGNOSIS — J069 Acute upper respiratory infection, unspecified: Secondary | ICD-10-CM | POA: Insufficient documentation

## 2013-12-23 NOTE — Assessment & Plan Note (Signed)
Low carb diet and exercise.  Follow met b and a1c.    

## 2013-12-23 NOTE — Assessment & Plan Note (Signed)
Off Protonix.  Doing well.  Follow.    

## 2013-12-23 NOTE — Assessment & Plan Note (Signed)
Low cholesterol diet and exercise.  On pravastatin.  Follow lipid panel and liver function.    

## 2013-12-23 NOTE — Assessment & Plan Note (Signed)
Symptoms improved.  Persistent.  Feel may be partially treated.  Continue mucinex and robitussin as directed.  Saline nasal spray and Flonase as directed.  zpak as directed.  Follow.

## 2013-12-23 NOTE — Assessment & Plan Note (Signed)
mycelex troches as directed.  If persistent, will need ENT evaluation.

## 2013-12-23 NOTE — Assessment & Plan Note (Signed)
Blood pressure as outlined.  Continue current medication regimen.  Follow metabolic panel.   Doing better.

## 2013-12-23 NOTE — Progress Notes (Signed)
Subjective:    Patient ID: Rachel Collins, female    DOB: 1961/04/27, 53 y.o.   MRN: [id number]  HPI 53 year old female with past history of hypertension and hypercholesterolemia.  She comes in today for a scheduled follow up.  States she is doing well.   Feels much better.  Her and her husband are separated.  No abdominal pain or cramping.  No bowel change.  No hot flashes.  No chest pain or tightness.  Breathing stable.  Has had some increased drainage and productive cough.  No wheezing.  No sob.  Went to [facility].  Was placed on a zpak.  Better, but still with persistent symptoms.   Blood pressure averaging 127-135/80.  Still with the "cut" on her tongue.  The Dukes did not help. Burns with certain foods.  No sore throat.       Past Medical History  Diagnosis Date  . Hypertension   . Hypercholesterolemia   . Hyperglycemia   . Gastritis   . Esophagitis   . History of migraine headaches   . Gastroparesis     Outpatient Encounter Prescriptions as of 12/20/2013  Medication Sig  . acetaminophen (TYLENOL) 650 MG CR tablet Take 650 mg by mouth every 8 (eight) hours as needed.  Marland Kitchen. albuterol (PROVENTIL HFA;VENTOLIN HFA) 108 (90 BASE) MCG/ACT inhaler Inhale 2 puffs into the lungs every 6 (six) hours as needed for wheezing.  Marland Kitchen. amLODipine (NORVASC) 5 MG tablet Take 1 tablet (5 mg total) by mouth daily.  . fluticasone (FLONASE) 50 MCG/ACT nasal spray Place 2 sprays into the nose daily.  . hydrochlorothiazide (HYDRODIURIL) 25 MG tablet TAKE 1 TABLET (25 MG TOTAL) BY MOUTH DAILY.  Marland Kitchen. losartan (COZAAR) 100 MG tablet Take 1 tablet (100 mg total) by mouth daily.  . pravastatin (PRAVACHOL) 20 MG tablet Take 1 tablet (20 mg total) by mouth daily.  . sertraline (ZOLOFT) 50 MG tablet TAKE 1 TABLET BY MOUTH EVERY DAY  . azithromycin (ZITHROMAX) 250 MG tablet Take two tablets x 1 day and then one tablet per day for four more days  . clotrimazole (MYCELEX) 10 MG troche Take 1 tablet (10 mg total) by mouth 3  (three) times daily.    Review of Systems Patient denies any headache, lightheadedness or dizziness.  No increased sinus or allergy symptoms now.   No chest pain, tightness or palpitations.  No increased shortness of breath, cough or congestion.  No nausea or vomiting. No acid reflux.  Not on Protonix.  No abdominal pain or cramping.  No bowel change, such as diarrhea, constipation, BRBPR or melana.  No urine change.  Feels good.  Blood pressure as outlined.   Still with some increased stress with her job.  Also some family stress.  Feels she is handling things relatively well.      Objective:   Physical Exam  Filed Vitals:   12/20/13 1425  BP: 130/80  Pulse: 81  Temp: 98.3 F (36.8 C)   Blood pressure recheck:  118/78, pulse [id number]  53 year old female in no acute distress.   HEENT:  Nares- slightly erythematous turbinates.  Oropharynx - without lesions.  "cut" tongue.   NECK:  Supple.  Nontender.  No audible bruit.  HEART:  Appears to be regular. LUNGS:  No crackles or wheezing audible.  Respirations even and unlabored.  RADIAL PULSE:  Equal bilaterally.  ABDOMEN:  Soft, nontender.  Bowel sounds present and normal.  No audible abdominal bruit.  EXTREMITIES:  No increased edema present.  DP pulses palpable and equal bilaterally.   FEET:  No lesions.             Assessment & Plan:  INCREASED PSYCHOSOCIAL STRESSORS.  On Zoloft and doing well.  She feels she is handling the separation well.  Does not feel she needs any further intervention at this point.  Follow.   CARDIOVASCULAR.  Currently asymptomatic.  Continue risk factor modification.    PREVIOUS ABNORMAL MAMMOGRAM.  Saw dr Lemar Livings.  S/P biopsy.  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  Mammogram 03/04/12 - BiRADS II.  Was scheduled a follow up mammogram.  Had to reschedule.  Has not had yet.  Schedule today.    GI.  Symptoms have resolved.  Off Protonix.  Due follow up colonoscopy 2016.  Currently doing well.    MSK.  S/P fusion of C5-C7.  Doing well.  Saw Dr Jeral Fruit.   HEALTH MAINTENANCE.  Physical 03/21/13.  Mammogram 03/04/12 - BiRADS II.   Needs her follow up mammogram.  Schedule.   Colonoscopy due 2016.

## 2013-12-27 ENCOUNTER — Ambulatory Visit: Payer: Self-pay | Admitting: Internal Medicine

## 2013-12-27 LAB — HM MAMMOGRAPHY: HM MAMMO: NEGATIVE

## 2013-12-29 ENCOUNTER — Encounter: Payer: Self-pay | Admitting: Internal Medicine

## 2014-01-05 ENCOUNTER — Telehealth: Payer: Self-pay | Admitting: Internal Medicine

## 2014-01-05 NOTE — Telephone Encounter (Signed)
Patient called in states [facility] [place] amlodipine 5 mg and losartin 100mg  she has 1 of each medication left. She states sometimes it takes up to 2 weeks for them to get a response from us that is why she was contacting our office.

## 2014-01-06 ENCOUNTER — Other Ambulatory Visit: Payer: Self-pay | Admitting: *Deleted

## 2014-01-06 MED ORDER — LOSARTAN POTASSIUM 100 MG PO TABS: 100.0000 mg | ORAL_TABLET | Freq: Every day | ORAL | Status: DC

## 2014-01-06 MED ORDER — AMLODIPINE BESYLATE 5 MG PO TABS: 5.0000 mg | ORAL_TABLET | Freq: Every day | ORAL | Status: DC

## 2014-01-06 NOTE — Telephone Encounter (Signed)
Rx's sent via eRx, all Rx's all filled within 24-48 hours of receiving request. If it takes that long to receive her medication, perhaps we are not receiving her request from the pharmacy in a timely manner.

## 2014-01-23 ENCOUNTER — Encounter: Payer: Self-pay | Admitting: Internal Medicine

## 2014-01-25 ENCOUNTER — Ambulatory Visit: Payer: PRIVATE HEALTH INSURANCE | Admitting: Internal Medicine

## 2014-01-27 ENCOUNTER — Encounter: Payer: Self-pay | Admitting: Internal Medicine

## 2014-01-27 ENCOUNTER — Ambulatory Visit (INDEPENDENT_AMBULATORY_CARE_PROVIDER_SITE_OTHER): Payer: PRIVATE HEALTH INSURANCE | Admitting: Internal Medicine

## 2014-01-27 VITALS — BP 120/68 | HR 96 | Temp 98.1°F | Resp 18 | Wt 142.8 lb

## 2014-01-27 DIAGNOSIS — H6980 Other specified disorders of Eustachian tube, unspecified ear: Secondary | ICD-10-CM

## 2014-01-27 MED ORDER — PREDNISONE (PAK) 10 MG PO TABS: ORAL_TABLET | ORAL | Status: DC

## 2014-01-27 MED ORDER — LEVOFLOXACIN 500 MG PO TABS: 500.0000 mg | ORAL_TABLET | Freq: Every day | ORAL | Status: DC

## 2014-01-27 NOTE — Progress Notes (Signed)
Patient ID: Rachel Collins, female   DOB: February 15, 1961, 53 y.o.   MRN: [id number]   Patient Active Problem List   Diagnosis Date Noted  . Eustachian tube dysfunction 01/29/2014  . URI (upper respiratory infection) 12/23/2013  . Tongue lesion 12/23/2013  . Hypercholesterolemia 11/12/2012  . Hypertension 11/12/2012  . Hyperglycemia 11/12/2012  . GERD (gastroesophageal reflux disease) 11/12/2012    Subjective:  CC:   Chief Complaint  Patient presents with  . Ear Fullness    HPI:   Rachel Collins is a 53 y.o. female who presents for evaluation of ear fullness.  She had  2 days of nausea followed by 4 days of abdominal cramping and watery diarrhea.  Some sinus drainage and sneezing. Was not seen bc of death in the family All previously stated symptoms have resolved but has been having pain in her lleft ear accompanied by sense of fullness and movement.  She has tried popping it but the symptoms persist.   Past Medical History  Diagnosis Date  . Hypertension   . Hypercholesterolemia   . Hyperglycemia   . Gastritis   . Esophagitis   . History of migraine headaches   . Gastroparesis     Past Surgical History  Procedure Laterality Date  . Tubal ligation    . Cervical disc surgery      C5-C7 fusion       The following portions of the patient's history were reviewed and updated as appropriate: Allergies, current medications, and problem list.    Review of Systems:   Patient denies headache, fevers, malaise, unintentional weight loss, skin rash, eye pain, sinus congestion and sinus pain, sore throat, dysphagia,  hemoptysis , cough, dyspnea, wheezing, chest pain, palpitations, orthopnea, edema, abdominal pain, nausea, melena, diarrhea, constipation, flank pain, dysuria, hematuria, urinary  Frequency, nocturia, numbness, tingling, seizures,  Focal weakness, Loss of consciousness,  Tremor, insomnia, depression, anxiety, and suicidal ideation.     History   Social History  .  Marital Status: Legally Separated    Spouse Name: N/A    Number of Children: 1  . Years of Education: N/A   Occupational History  . Not on file.   Social History Main Topics  . Smoking status: Never Smoker   . Smokeless tobacco: Never Used  . Alcohol Use: Yes  . Drug Use: No  . Sexual Activity: Not on file   Other Topics Concern  . Not on file   Social History Narrative  . No narrative on file    Objective:  Filed Vitals:   01/27/14 1348  BP: 120/68  Pulse: 96  Temp: 98.1 F (36.7 C)  Resp: 18     General appearance: alert, cooperative and appears stated age Ears: normal TM's and external ear canals both ears Throat: lips, mucosa, and tongue normal; teeth and gums normal Neck: no adenopathy, no carotid bruit, supple, symmetrical, trachea midline and thyroid not enlarged, symmetric, no tenderness/mass/nodules Back: symmetric, no curvature. ROM normal. No CVA tenderness. Lungs: clear to auscultation bilaterally Heart: regular rate and rhythm, S1, S2 normal, no murmur, click, rub or gallop Abdomen: soft, non-tender; bowel sounds normal; no masses,  no organomegaly Pulses: 2+ and symmetric Skin: Skin color, texture, turgor normal. No rashes or lesions Lymph nodes: Cervical, supraclavicular, and axillary nodes normal.  Assessment and Plan:  Eustachian tube dysfunction Prednisone taper , saline sinus flushes and decongestants. Add antibiotic if pain and,/or fever develops   Updated Medication List Outpatient Encounter Prescriptions as of 01/27/2014  Medication Sig  . acetaminophen (TYLENOL) 650 MG CR tablet Take 650 mg by mouth every 8 (eight) hours as needed.  Marland Kitchen. albuterol (PROVENTIL HFA;VENTOLIN HFA) 108 (90 BASE) MCG/ACT inhaler Inhale 2 puffs into the lungs every 6 (six) hours as needed for wheezing.  Marland Kitchen. amLODipine (NORVASC) 5 MG tablet Take 1 tablet (5 mg total) by mouth daily.  . hydrochlorothiazide (HYDRODIURIL) 25 MG tablet TAKE 1 TABLET (25 MG TOTAL) BY MOUTH  DAILY.  Marland Kitchen. losartan (COZAAR) 100 MG tablet Take 1 tablet (100 mg total) by mouth daily.  . pravastatin (PRAVACHOL) 20 MG tablet Take 1 tablet (20 mg total) by mouth daily.  . sertraline (ZOLOFT) 50 MG tablet TAKE 1 TABLET BY MOUTH EVERY DAY  . azithromycin (ZITHROMAX) 250 MG tablet Take two tablets x 1 day and then one tablet per day for four more days  . clotrimazole (MYCELEX) 10 MG troche Take 1 tablet (10 mg total) by mouth 3 (three) times daily.  . fluticasone (FLONASE) 50 MCG/ACT nasal spray Place 2 sprays into the nose daily.  Marland Kitchen. levofloxacin (LEVAQUIN) 500 MG tablet Take 1 tablet (500 mg total) by mouth daily.  . predniSONE (STERAPRED UNI-PAK) 10 MG tablet 6 tablets on Day 1 , then reduce by 1 tablet daily until gone     No orders of the defined types were placed in this encounter.    No Follow-up on file.

## 2014-01-27 NOTE — Patient Instructions (Signed)
You have no signs of a  sinus/ear infection  At this point  .  I am a  prednisone taper  To manage the inflammation in your left ear and maxillary sinus.   I also advise use of the following OTC meds to help with your other symptoms.   Take generic OTC benadryl 25 mg every 8 hours for the drainage,  Sudafed PE  10 to 30 mg every 8 hours for the congestion, you may substitute Afrin nasal spray for the nighttime dose of sudafed PE  If needed to prevent insomnia.  flushes your sinuses twice daily with Simply Saline (do over the sink because if you do it right you may spit out globs of mucus)   If you are not siginificatnly better in 48 hours or if you develop T > 100.4,  Green nasal discharge,  Or ear  pain,  Start the levaquin  Please take a probiotic ( Align, Floraque or Culturelle) while you are on the antibiotic to prevent a serious antibiotic associated diarrhea  Called clostridium dificile colitis and a vaginal yeast infection

## 2014-01-27 NOTE — Progress Notes (Signed)
Pre-visit discussion using our clinic review tool. No additional management support is needed unless otherwise documented below in the visit note.  

## 2014-01-29 ENCOUNTER — Encounter: Payer: Self-pay | Admitting: Internal Medicine

## 2014-01-29 DIAGNOSIS — H6980 Other specified disorders of Eustachian tube, unspecified ear: Secondary | ICD-10-CM | POA: Insufficient documentation

## 2014-01-29 DIAGNOSIS — H6990 Unspecified Eustachian tube disorder, unspecified ear: Secondary | ICD-10-CM | POA: Insufficient documentation

## 2014-01-29 NOTE — Assessment & Plan Note (Signed)
Prednisone taper , saline sinus flushes and decongestants. Add antibiotic if pain and,/or fever develops

## 2014-03-03 ENCOUNTER — Other Ambulatory Visit: Payer: Self-pay | Admitting: *Deleted

## 2014-03-03 ENCOUNTER — Telehealth: Payer: Self-pay | Admitting: Internal Medicine

## 2014-03-03 MED ORDER — FLUTICASONE PROPIONATE 50 MCG/ACT NA SUSP: 2.0000 | Freq: Every day | NASAL | Status: DC

## 2014-03-03 NOTE — Telephone Encounter (Signed)
No request was received from either pharmacy. Rx [facility] to [facility] [address]

## 2014-03-03 NOTE — Telephone Encounter (Signed)
Fluticasone 50 mcg spray needed.  [facility] [place] [facility] [address]  States her pharmacy told her they faxed request with no response.

## 2014-03-06 ENCOUNTER — Encounter: Payer: Self-pay | Admitting: Internal Medicine

## 2014-03-28 ENCOUNTER — Encounter: Payer: PRIVATE HEALTH INSURANCE | Admitting: Internal Medicine

## 2014-04-08 ENCOUNTER — Other Ambulatory Visit: Payer: Self-pay | Admitting: Internal Medicine

## 2014-05-12 ENCOUNTER — Ambulatory Visit (INDEPENDENT_AMBULATORY_CARE_PROVIDER_SITE_OTHER): Payer: PRIVATE HEALTH INSURANCE | Admitting: Adult Health

## 2014-05-12 ENCOUNTER — Encounter: Payer: Self-pay | Admitting: Adult Health

## 2014-05-12 VITALS — BP 122/68 | HR 83 | Temp 97.6°F | Resp 14 | Wt 150.2 lb

## 2014-05-12 DIAGNOSIS — J329 Chronic sinusitis, unspecified: Secondary | ICD-10-CM

## 2014-05-12 MED ORDER — LEVOFLOXACIN 500 MG PO TABS: 500.0000 mg | ORAL_TABLET | Freq: Every day | ORAL | Status: DC

## 2014-05-12 MED ORDER — GUAIFENESIN-CODEINE 100-10 MG/5ML PO SOLN: 5.0000 mL | Freq: Three times a day (TID) | ORAL | Status: DC | PRN

## 2014-05-12 NOTE — Progress Notes (Signed)
Patient ID: Rachel Collins, female   DOB: 07/03/1961, 53 y.o.   MRN: [id number]   Subjective:    Patient ID: Rachel Collins, female    DOB: November 22, 1961, 53 y.o.   MRN: [id number]  HPI  Patient is a pleasant 53 year old female who presents to clinic with pressure on the left side of her maxillary sinus, left ear, postnasal drip. Her left nostril is swollen as well. Patient has been blowing her nose and noticing "chunks of pieces of dried blood or green thick mucus". She is having trouble sleeping at night secondary to significant nasal congestion and also cough from the postnasal drip. Reports cough is keeping her up at night.   Past Medical History  Diagnosis Date  . Hypertension   . Hypercholesterolemia   . Hyperglycemia   . Gastritis   . Esophagitis   . History of migraine headaches   . Gastroparesis     Current Outpatient Prescriptions on File Prior to Visit  Medication Sig Dispense Refill  . amLODipine (NORVASC) 5 MG tablet Take 1 tablet (5 mg total) by mouth daily.  30 tablet  5  . hydrochlorothiazide (HYDRODIURIL) 25 MG tablet TAKE 1 TABLET (25 MG TOTAL) BY MOUTH DAILY.  30 tablet  5  . losartan (COZAAR) 100 MG tablet Take 1 tablet (100 mg total) by mouth daily.  30 tablet  5  . pravastatin (PRAVACHOL) 20 MG tablet Take 1 tablet (20 mg total) by mouth daily.  30 tablet  5   No current facility-administered medications on file prior to visit.     Review of Systems  Constitutional: Negative for fever and chills.  HENT: Positive for congestion, facial swelling (on the left), postnasal drip and sinus pressure. Negative for rhinorrhea, sneezing and sore throat.   Respiratory: Positive for cough. Negative for shortness of breath and wheezing.   Musculoskeletal:       Swollen and painful right hand s/p injury       Objective:  BP 122/68  Pulse 83  Temp(Src) 97.6 F (36.4 C) (Oral)  Resp 14  Wt 150 lb 4 oz (68.153 kg)  SpO2 96%   Physical Exam  Constitutional: She is  oriented to person, place, and time. She appears well-developed and well-nourished. No distress.  HENT:  Head: Normocephalic and atraumatic.  Right Ear: External ear normal.  Mouth/Throat: No oropharyngeal exudate.  Left TM is slight bulging. Erythema of ear canal. No signs of infection. Left nostril is edematous  Cardiovascular: Normal rate, regular rhythm and normal heart sounds.  Exam reveals no gallop.   No murmur heard. Pulmonary/Chest: Effort normal and breath sounds normal. No respiratory distress. She has no wheezes. She has no rales.  Neurological: She is alert and oriented to person, place, and time.  Psychiatric: She has a normal mood and affect. Her behavior is normal. Judgment and thought content normal.      Assessment & Plan:   1. Sinusitis Start Levaquin x 7 days. Robitussin AC for cough. Afrin x 3 days. Continue nasacort. Irrigate sinuses with simply saline. RTC if no improvement within 4-5 days. If not improvement will need referral to ENT

## 2014-05-12 NOTE — Patient Instructions (Signed)
  Start Levaquin 500 mg daily for 7 days.  Robitussin AC for severe cough. Causes sedation - Do not drive when taking this.  Irrigate your sinuses daily with Simply Saline.  Afrin twice a day for 3 days. Definitely use at bedtime.  Continue Nasacort

## 2014-05-12 NOTE — Progress Notes (Signed)
Pre visit review using our clinic review tool, if applicable. No additional management support is needed unless otherwise documented below in the visit note. 

## 2014-05-18 ENCOUNTER — Telehealth: Payer: Self-pay | Admitting: Internal Medicine

## 2014-05-18 MED ORDER — PRAVASTATIN SODIUM 20 MG PO TABS: 20.0000 mg | ORAL_TABLET | Freq: Every day | ORAL | Status: DC

## 2014-05-18 NOTE — Telephone Encounter (Signed)
Pt states she was told to inform Raquel if her nose did not improve.  States it is the same as it was at her visit 5/22, no change, no improvement.    Also requesting refill:  Pravastatin 20 mg.

## 2014-05-18 NOTE — Telephone Encounter (Signed)
Appt sch 07/18/14, Rx sent to pharmacy by escript

## 2014-05-19 ENCOUNTER — Other Ambulatory Visit: Payer: Self-pay | Admitting: Adult Health

## 2014-05-19 DIAGNOSIS — J329 Chronic sinusitis, unspecified: Secondary | ICD-10-CM

## 2014-05-19 NOTE — Telephone Encounter (Signed)
Patient requesting a referral to see a specialist about her nose. Stated that if it didn't get any better you would give her a referral. Please advise

## 2014-05-19 NOTE — Telephone Encounter (Signed)
Notified patient that referral has been made to ENT

## 2014-05-19 NOTE — Telephone Encounter (Signed)
Pt states she received refill on pravastatin.  States R. Rey told her if her nose had not imrpoved she would be sent to a specialist.  Pt asking for this referral asap as her nose is just as bad as it was when she was last seen.

## 2014-05-19 NOTE — Telephone Encounter (Signed)
Referral to ENT made 

## 2014-07-18 ENCOUNTER — Encounter: Payer: PRIVATE HEALTH INSURANCE | Admitting: Internal Medicine

## 2014-07-18 DIAGNOSIS — Z0289 Encounter for other administrative examinations: Secondary | ICD-10-CM

## 2014-08-09 ENCOUNTER — Other Ambulatory Visit: Payer: Self-pay | Admitting: Internal Medicine

## 2014-08-09 NOTE — Telephone Encounter (Signed)
Appt 10/03/14

## 2014-09-18 ENCOUNTER — Other Ambulatory Visit: Payer: Self-pay | Admitting: Internal Medicine

## 2014-09-18 NOTE — Telephone Encounter (Signed)
Spoke with pt, she is still currently taking Sertraline daily. Rx sent to pharmacy by escript. Has appt scheduled 10/03/14

## 2014-10-03 ENCOUNTER — Ambulatory Visit (INDEPENDENT_AMBULATORY_CARE_PROVIDER_SITE_OTHER): Payer: PRIVATE HEALTH INSURANCE | Admitting: Internal Medicine

## 2014-10-03 ENCOUNTER — Encounter: Payer: Self-pay | Admitting: Internal Medicine

## 2014-10-03 VITALS — BP 130/90 | HR 88 | Temp 98.7°F | Ht 64.0 in | Wt 142.8 lb

## 2014-10-03 DIAGNOSIS — R739 Hyperglycemia, unspecified: Secondary | ICD-10-CM

## 2014-10-03 DIAGNOSIS — Z658 Other specified problems related to psychosocial circumstances: Secondary | ICD-10-CM

## 2014-10-03 DIAGNOSIS — E7800 Pure hypercholesterolemia, unspecified: Secondary | ICD-10-CM

## 2014-10-03 DIAGNOSIS — K148 Other diseases of tongue: Secondary | ICD-10-CM

## 2014-10-03 DIAGNOSIS — K219 Gastro-esophageal reflux disease without esophagitis: Secondary | ICD-10-CM

## 2014-10-03 DIAGNOSIS — M542 Cervicalgia: Secondary | ICD-10-CM

## 2014-10-03 DIAGNOSIS — I10 Essential (primary) hypertension: Secondary | ICD-10-CM

## 2014-10-03 DIAGNOSIS — F439 Reaction to severe stress, unspecified: Secondary | ICD-10-CM

## 2014-10-03 MED ORDER — PRAVASTATIN SODIUM 20 MG PO TABS: ORAL_TABLET | ORAL | Status: DC

## 2014-10-03 MED ORDER — SERTRALINE HCL 50 MG PO TABS: ORAL_TABLET | ORAL | Status: DC

## 2014-10-03 MED ORDER — AMLODIPINE BESYLATE 5 MG PO TABS: ORAL_TABLET | ORAL | Status: DC

## 2014-10-03 MED ORDER — LOSARTAN POTASSIUM 100 MG PO TABS: ORAL_TABLET | ORAL | Status: DC

## 2014-10-03 MED ORDER — HYDROCHLOROTHIAZIDE 25 MG PO TABS: ORAL_TABLET | ORAL | Status: DC

## 2014-10-03 NOTE — Progress Notes (Signed)
Pre visit review using our clinic review tool, if applicable. No additional management support is needed unless otherwise documented below in the visit note. 

## 2014-10-09 ENCOUNTER — Other Ambulatory Visit: Payer: Self-pay | Admitting: Internal Medicine

## 2014-10-09 DIAGNOSIS — M542 Cervicalgia: Secondary | ICD-10-CM | POA: Insufficient documentation

## 2014-10-09 DIAGNOSIS — F439 Reaction to severe stress, unspecified: Secondary | ICD-10-CM | POA: Insufficient documentation

## 2014-10-09 NOTE — Assessment & Plan Note (Signed)
She has been biting her tongue.  Since aware, better.  Follow.

## 2014-10-09 NOTE — Assessment & Plan Note (Signed)
Blood pressure as outlined.  Continue current medication regimen. Follow metabolic panel.  Has been doing better.  Increased stress recently.  Follow.

## 2014-10-09 NOTE — Assessment & Plan Note (Signed)
Has seen Dr Jeral FruitBotero previously.  Has been overworked.  Increased pain.  Saw [facility].  Received toradol.  Has flexeril.  Better.  Follow.

## 2014-10-09 NOTE — Assessment & Plan Note (Signed)
Off Protonix.  Doing well.  Follow.

## 2014-10-09 NOTE — Assessment & Plan Note (Signed)
Low carb diet and exercise.  Follow met b and a1c.    

## 2014-10-09 NOTE — Assessment & Plan Note (Signed)
Low cholesterol diet and exercise.  On pravastatin.  Follow lipid panel and liver function.    

## 2014-10-09 NOTE — Progress Notes (Signed)
Subjective:    Patient ID: Rachel Collins, female    DOB: 01-Aug-1961, 53 y.o.   MRN: [id number]  HPI 53 year old female with past history of hypertension and hypercholesterolemia.  She comes in today for a scheduled follow up.  States she has been under increased stress recently.  She is dealing with the legal issues of divorcing her husband.  She feels she is handling things relatively well.  No abdominal pain or cramping.  No significant bowel change.   No chest pain or tightness.  Breathing stable.  Blood pressure has been doing well.   Some neck pain.  Went to acute care.  S/p Toradol injection.  Flexeril.  Better.       Past Medical History  Diagnosis Date  . Hypertension   . Hypercholesterolemia   . Hyperglycemia   . Gastritis   . Esophagitis   . History of migraine headaches   . Gastroparesis     Outpatient Encounter Prescriptions as of 10/03/2014  Medication Sig  . amLODipine (NORVASC) 5 MG tablet TAKE 1 TABLET (5 MG TOTAL) BY MOUTH DAILY.  . hydrochlorothiazide (HYDRODIURIL) 25 MG tablet TAKE 1 TABLET BY MOUTH EVERY DAY  . losartan (COZAAR) 100 MG tablet TAKE 1 TABLET (100 MG TOTAL) BY MOUTH DAILY.  . pravastatin (PRAVACHOL) 20 MG tablet TAKE 1 TABLET BY MOUTH DAILY  . sertraline (ZOLOFT) 50 MG tablet TAKE 1 TABLET BY MOUTH EVERY DAY  . [DISCONTINUED] amLODipine (NORVASC) 5 MG tablet TAKE 1 TABLET (5 MG TOTAL) BY MOUTH DAILY.  . [DISCONTINUED] hydrochlorothiazide (HYDRODIURIL) 25 MG tablet TAKE 1 TABLET BY MOUTH EVERY DAY  . [DISCONTINUED] losartan (COZAAR) 100 MG tablet TAKE 1 TABLET (100 MG TOTAL) BY MOUTH DAILY.  . [DISCONTINUED] pravastatin (PRAVACHOL) 20 MG tablet TAKE 1 TABLET BY MOUTH DAILY  . [DISCONTINUED] sertraline (ZOLOFT) 50 MG tablet TAKE 1 TABLET BY MOUTH EVERY DAY  . [DISCONTINUED] guaiFENesin-codeine 100-10 MG/5ML syrup Take 5 mLs by mouth 3 (three) times daily as needed.  . [DISCONTINUED] levofloxacin (LEVAQUIN) 500 MG tablet Take 1 tablet (500 mg total) by  mouth daily.    Review of Systems Patient denies any headache, lightheadedness or dizziness.  Neck pain as outlined.  No increased sinus or allergy symptoms now.   No chest pain, tightness or palpitations.  No increased shortness of breath, cough or congestion.  No nausea or vomiting.  No acid reflux.  Not on Protonix.  No abdominal pain or cramping.  No bowel change, such as diarrhea, constipation, BRBPR or melana.  No urine change.  Feels good.  Blood pressure has been doing better.   With some increased stress with her job.  Also some family stress.  Feels she is handling things relatively well.       Objective:   Physical Exam  Filed Vitals:   10/03/14 1530  BP: 130/90  Pulse: 88  Temp: 98.7 F (37.1 C)   Blood pressure recheck:  91/67  53 year old female in no acute distress.   HEENT:  Nares- clear.  Oropharynx - without lesions.  NECK:  Supple.  Nontender.  No audible bruit.  HEART:  Appears to be regular. LUNGS:  No crackles or wheezing audible.  Respirations even and unlabored.  RADIAL PULSE:  Equal bilaterally.  ABDOMEN:  Soft, nontender.  Bowel sounds present and normal.  No audible abdominal bruit.    EXTREMITIES:  No increased edema present.  DP pulses palpable and equal bilaterally.   FEET:  No  lesions.             Assessment & Plan:  INCREASED PSYCHOSOCIAL STRESSORS.  On Zoloft and doing well.  She feels she is handling the separation well.  Does not feel she needs any further intervention at this point.  Follow.   CARDIOVASCULAR.  Currently asymptomatic.  Continue risk factor modification.    PREVIOUS ABNORMAL MAMMOGRAM.  Saw dr Bary Castilla.  S/P biopsy.  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  Mammogram 03/04/12 - BiRADS II.  Mammogram 12/27/13 - Birads I.      GI.  Symptoms have resolved.  Off Protonix.  Due follow up colonoscopy 2016.  Currently doing well.   MSK.  S/P fusion of C5-C7.  Neck pain better.     HEALTH MAINTENANCE.  Schedule her for  a physical. Mammogram 12/27/13 - Birads I.  Colonoscopy due 2016.     Problem List Items Addressed This Visit   GERD (gastroesophageal reflux disease)     Off Protonix.  Doing well.  Follow.       Hypercholesterolemia     Low cholesterol diet and exercise.  On pravastatin.  Follow lipid panel and liver function.        Relevant Medications      amLODIpine (NORVASC) tablet      hydrochlorothiazide tablet      losartan (COZAAR) tablet      pravastatin (PRAVACHOL) tablet   Hyperglycemia     Low carb diet and exercise.  Follow met b and a1c.       Hypertension     Blood pressure as outlined.  Continue current medication regimen. Follow metabolic panel.  Has been doing better.  Increased stress recently.  Follow.        Relevant Medications      amLODIpine (NORVASC) tablet      hydrochlorothiazide tablet      losartan (COZAAR) tablet      pravastatin (PRAVACHOL) tablet   Neck pain - Primary     Has seen Dr Joya Salm previously.  Has been overworked.  Increased pain.  Saw [facility].  Received toradol.  Has flexeril.  Better.  Follow.       Stress     Increased stress as outlined.  Feels she is coping relatively well.  Follow.       Tongue lesion     She has been biting her tongue.  Since aware, better.  Follow.        I spent 25 minutes with the patient and more than 50% of the time was spent in consultation regarding the above.

## 2014-10-09 NOTE — Assessment & Plan Note (Signed)
Increased stress as outlined.  Feels she is coping relatively well.  Follow.   

## 2014-10-12 ENCOUNTER — Other Ambulatory Visit (INDEPENDENT_AMBULATORY_CARE_PROVIDER_SITE_OTHER): Payer: PRIVATE HEALTH INSURANCE

## 2014-10-12 ENCOUNTER — Other Ambulatory Visit: Payer: Self-pay | Admitting: Internal Medicine

## 2014-10-12 DIAGNOSIS — R945 Abnormal results of liver function studies: Secondary | ICD-10-CM

## 2014-10-12 DIAGNOSIS — R7989 Other specified abnormal findings of blood chemistry: Secondary | ICD-10-CM

## 2014-10-12 DIAGNOSIS — R739 Hyperglycemia, unspecified: Secondary | ICD-10-CM

## 2014-10-12 DIAGNOSIS — E7800 Pure hypercholesterolemia, unspecified: Secondary | ICD-10-CM

## 2014-10-12 DIAGNOSIS — I10 Essential (primary) hypertension: Secondary | ICD-10-CM

## 2014-10-12 DIAGNOSIS — Z658 Other specified problems related to psychosocial circumstances: Secondary | ICD-10-CM

## 2014-10-12 DIAGNOSIS — F439 Reaction to severe stress, unspecified: Secondary | ICD-10-CM

## 2014-10-12 LAB — BASIC METABOLIC PANEL
BUN: 17 mg/dL (ref 6–23)
CHLORIDE: 103 meq/L (ref 96–112)
CO2: 29 meq/L (ref 19–32)
Calcium: 9.2 mg/dL (ref 8.4–10.5)
Creatinine, Ser: 0.8 mg/dL (ref 0.4–1.2)
GFR: 85.3 mL/min (ref >60.00)
GLUCOSE: 108 mg/dL — AB (ref 70–99)
POTASSIUM: 4.5 meq/L (ref 3.5–5.1)
SODIUM: 141 meq/L (ref 135–145)

## 2014-10-12 LAB — CBC WITH DIFFERENTIAL/PLATELET
BASOS PCT: 0.5 % (ref 0.0–3.0)
Basophils Absolute: 0 10*3/uL (ref 0.0–0.1)
Eosinophils Absolute: 0.3 10*3/uL (ref 0.0–0.7)
Eosinophils Relative: 3.4 % (ref 0.0–5.0)
HCT: 42.4 % (ref 36.0–46.0)
HEMOGLOBIN: 14 g/dL (ref 12.0–15.0)
LYMPHS ABS: 2.1 10*3/uL (ref 0.7–4.0)
Lymphocytes Relative: 27.3 % (ref 12.0–46.0)
MCHC: 32.9 g/dL (ref 30.0–36.0)
MCV: 89.6 fl (ref 78.0–100.0)
MONO ABS: 0.5 10*3/uL (ref 0.1–1.0)
Monocytes Relative: 6.7 % (ref 3.0–12.0)
Neutro Abs: 4.8 10*3/uL (ref 1.4–7.7)
Neutrophils Relative %: 62.1 % (ref 43.0–77.0)
PLATELETS: 278 10*3/uL (ref 150.0–400.0)
RBC: 4.74 Mil/uL (ref 3.87–5.11)
RDW: 14.3 % (ref 11.5–15.5)
WBC: 7.7 10*3/uL (ref 4.0–10.5)

## 2014-10-12 LAB — HEPATIC FUNCTION PANEL
ALT: 36 U/L — ABNORMAL HIGH (ref 0–35)
AST: 25 U/L (ref 0–37)
Albumin: 4 g/dL (ref 3.5–5.2)
Alkaline Phosphatase: 107 U/L (ref 39–117)
BILIRUBIN DIRECT: 0 mg/dL (ref 0.0–0.3)
BILIRUBIN TOTAL: 0.6 mg/dL (ref 0.2–1.2)
Total Protein: 7.5 g/dL (ref 6.0–8.3)

## 2014-10-12 LAB — LIPID PANEL
CHOLESTEROL: 233 mg/dL — AB (ref 0–200)
HDL: 73.2 mg/dL (ref >39.00)
LDL CALC: 139 mg/dL — AB (ref 0–99)
NONHDL: 159.8
Total CHOL/HDL Ratio: 3
Triglycerides: 104 mg/dL (ref 0.0–149.0)
VLDL: 20.8 mg/dL (ref 0.0–40.0)

## 2014-10-12 LAB — HEMOGLOBIN A1C: Hgb A1c MFr Bld: 6.3 % (ref 4.6–6.5)

## 2014-10-12 LAB — TSH: TSH: 2.46 u[IU]/mL (ref 0.35–4.50)

## 2014-10-12 NOTE — Progress Notes (Signed)
Order placed for f/u lab.   

## 2014-10-27 ENCOUNTER — Other Ambulatory Visit (INDEPENDENT_AMBULATORY_CARE_PROVIDER_SITE_OTHER): Payer: PRIVATE HEALTH INSURANCE

## 2014-10-27 ENCOUNTER — Other Ambulatory Visit: Payer: Self-pay | Admitting: Internal Medicine

## 2014-10-27 ENCOUNTER — Telehealth: Payer: Self-pay | Admitting: *Deleted

## 2014-10-27 DIAGNOSIS — R945 Abnormal results of liver function studies: Secondary | ICD-10-CM

## 2014-10-27 DIAGNOSIS — R7989 Other specified abnormal findings of blood chemistry: Secondary | ICD-10-CM

## 2014-10-27 LAB — HEPATIC FUNCTION PANEL
ALBUMIN: 4 g/dL (ref 3.5–5.2)
ALK PHOS: 98 U/L (ref 39–117)
ALT: 36 U/L — ABNORMAL HIGH (ref 0–35)
AST: 29 U/L (ref 0–37)
BILIRUBIN DIRECT: 0.1 mg/dL (ref 0.0–0.3)
TOTAL PROTEIN: 7 g/dL (ref 6.0–8.3)
Total Bilirubin: 0.5 mg/dL (ref 0.2–1.2)

## 2014-10-27 NOTE — Progress Notes (Signed)
Order placed for f/u liver panel.  

## 2014-10-27 NOTE — Telephone Encounter (Signed)
Pt is currently going through a divorce & not sleeping much at all (3-4 hours). And it seems to be catching up with her. She take Z-Quil & Melatonin 5mg  (goes to sleep but cant sleep long). Pt would like to know if she could get something that would help her get some better sleep. Please advise.

## 2014-10-27 NOTE — Telephone Encounter (Signed)
Pt notified. appt scheduled.

## 2014-10-27 NOTE — Telephone Encounter (Signed)
I will need to see her.  I can see her 10/31/14 - at 10:30.   Let me know if any problems.

## 2014-10-31 ENCOUNTER — Encounter: Payer: Self-pay | Admitting: Internal Medicine

## 2014-10-31 ENCOUNTER — Ambulatory Visit (INDEPENDENT_AMBULATORY_CARE_PROVIDER_SITE_OTHER): Payer: PRIVATE HEALTH INSURANCE | Admitting: Internal Medicine

## 2014-10-31 VITALS — BP 130/90 | HR 84 | Temp 98.7°F | Ht 64.0 in | Wt 150.8 lb

## 2014-10-31 DIAGNOSIS — E7800 Pure hypercholesterolemia, unspecified: Secondary | ICD-10-CM

## 2014-10-31 DIAGNOSIS — I10 Essential (primary) hypertension: Secondary | ICD-10-CM

## 2014-10-31 DIAGNOSIS — F439 Reaction to severe stress, unspecified: Secondary | ICD-10-CM

## 2014-10-31 DIAGNOSIS — Z658 Other specified problems related to psychosocial circumstances: Secondary | ICD-10-CM

## 2014-10-31 DIAGNOSIS — K219 Gastro-esophageal reflux disease without esophagitis: Secondary | ICD-10-CM

## 2014-10-31 MED ORDER — TRAZODONE HCL 50 MG PO TABS: 25.0000 mg | ORAL_TABLET | Freq: Every evening | ORAL | Status: DC | PRN

## 2014-10-31 MED ORDER — SERTRALINE HCL 50 MG PO TABS: ORAL_TABLET | ORAL | Status: DC

## 2014-10-31 NOTE — Progress Notes (Signed)
Pre visit review using our clinic review tool, if applicable. No additional management support is needed unless otherwise documented below in the visit note. 

## 2014-11-03 ENCOUNTER — Encounter: Payer: Self-pay | Admitting: Internal Medicine

## 2014-11-03 NOTE — Progress Notes (Signed)
Subjective:    Patient ID: Rachel Collins, female    DOB: Jun 14, 1961, 53 y.o.   MRN: [id number]  HPI 53 year old female with past history of hypertension and hypercholesterolemia.  She comes in today as a work in to discuss the increased stress and sleeping issues.  States she has been under increased stress recently.  She is dealing with the legal issues of divorcing her husband.  She has good support.  Feels more stress and anxious.  No sleeping.  Increased stress at work as well.  No abdominal pain or cramping.  Bowels more loose with the increased stress.  She is eating.   No chest pain or tightness.  Breathing stable.  Blood pressure elevated.        Past Medical History  Diagnosis Date  . Hypertension   . Hypercholesterolemia   . Hyperglycemia   . Gastritis   . Esophagitis   . History of migraine headaches   . Gastroparesis     Outpatient Encounter Prescriptions as of 10/31/2014  Medication Sig  . amLODipine (NORVASC) 5 MG tablet TAKE 1 TABLET (5 MG TOTAL) BY MOUTH DAILY.  . hydrochlorothiazide (HYDRODIURIL) 25 MG tablet TAKE 1 TABLET BY MOUTH EVERY DAY  . losartan (COZAAR) 100 MG tablet TAKE 1 TABLET (100 MG TOTAL) BY MOUTH DAILY.  . pravastatin (PRAVACHOL) 20 MG tablet TAKE 1 TABLET BY MOUTH DAILY  . sertraline (ZOLOFT) 50 MG tablet Take 1 1/2 tablet q day  . [DISCONTINUED] sertraline (ZOLOFT) 50 MG tablet TAKE 1 TABLET BY MOUTH EVERY DAY  . cyclobenzaprine (FLEXERIL) 5 MG tablet TAKE 1 TABLET (5 MG TOTAL) BY MOUTH 3 (THREE) TIMES DAILY AS NEEDED FOR MUSCLE SPASMS.  Marland Kitchen. traZODone (DESYREL) 50 MG tablet Take 0.5-1 tablets (25-50 mg total) by mouth at bedtime as needed for sleep.    Review of Systems Patient denies any headache, lightheadedness or dizziness.  No increased sinus or allergy symptoms now.   No chest pain, tightness or palpitations.  No increased shortness of breath, cough or congestion.  No nausea or vomiting.  No acid reflux.  Not on Protonix.  No abdominal pain or  cramping.  Some loose stool with the increased stress.   With some increased stress with her job.  Also some family stress with her husband.  See above.        Objective:   Physical Exam  Filed Vitals:   10/31/14 1035  BP: 130/90  Pulse: 84  Temp: 98.7 F (37.1 C)   Blood pressure recheck:  48138/6984  53 year old female in no acute distress.   HEENT:  Nares- clear.  Oropharynx - without lesions.  NECK:  Supple.  Nontender.  No audible bruit.  HEART:  Appears to be regular. LUNGS:  No crackles or wheezing audible.  Respirations even and unlabored.  RADIAL PULSE:  Equal bilaterally.  ABDOMEN:  Soft, nontender.  Bowel sounds present and normal.  No audible abdominal bruit.    EXTREMITIES:  No increased edema present.  DP pulses palpable and equal bilaterally.            Assessment & Plan:  CARDIOVASCULAR.  Currently asymptomatic.  Continue risk factor modification.   Essential hypertension Blood pressure as outlined.  Treat the increased stress and sleep issue.  Follow.    Gastroesophageal reflux disease, esophagitis presence not specified Controlled.    Hypercholesterolemia Same medication regimen.    Stress Increased stress as outlined.  Discussed at length with her today.  Continue zoloft as she is doing.  Not sleeping well.  Has tried otc (including melatonin).  Not working.  Start trazodone as directed.  Titrate up as needed.  Follow.  She does not feel needs referral to psych at this time.  Follow.    HEALTH MAINTENANCE.  Scheduled  for a physical. Mammogram 12/27/13 - Birads I.  Colonoscopy due 2016.

## 2014-11-27 ENCOUNTER — Other Ambulatory Visit: Payer: PRIVATE HEALTH INSURANCE

## 2014-12-05 ENCOUNTER — Other Ambulatory Visit (INDEPENDENT_AMBULATORY_CARE_PROVIDER_SITE_OTHER): Payer: PRIVATE HEALTH INSURANCE

## 2014-12-05 DIAGNOSIS — R7989 Other specified abnormal findings of blood chemistry: Secondary | ICD-10-CM

## 2014-12-05 DIAGNOSIS — R945 Abnormal results of liver function studies: Secondary | ICD-10-CM

## 2014-12-05 LAB — HEPATIC FUNCTION PANEL
ALBUMIN: 4.5 g/dL (ref 3.5–5.2)
ALT: 27 U/L (ref 0–35)
AST: 21 U/L (ref 0–37)
Alkaline Phosphatase: 93 U/L (ref 39–117)
Bilirubin, Direct: 0 mg/dL (ref 0.0–0.3)
Total Bilirubin: 0.3 mg/dL (ref 0.2–1.2)
Total Protein: 7.1 g/dL (ref 6.0–8.3)

## 2014-12-06 ENCOUNTER — Encounter: Payer: Self-pay | Admitting: *Deleted

## 2015-01-03 ENCOUNTER — Other Ambulatory Visit: Payer: Self-pay | Admitting: *Deleted

## 2015-01-03 MED ORDER — TRAZODONE HCL 50 MG PO TABS: 25.0000 mg | ORAL_TABLET | Freq: Every evening | ORAL | Status: DC | PRN

## 2015-01-09 ENCOUNTER — Ambulatory Visit: Payer: PRIVATE HEALTH INSURANCE | Admitting: Internal Medicine

## 2015-03-09 ENCOUNTER — Encounter: Payer: Self-pay | Admitting: Internal Medicine

## 2015-03-09 ENCOUNTER — Ambulatory Visit (INDEPENDENT_AMBULATORY_CARE_PROVIDER_SITE_OTHER): Payer: PRIVATE HEALTH INSURANCE | Admitting: Internal Medicine

## 2015-03-09 VITALS — BP 110/74 | HR 70 | Temp 98.5°F | Resp 12 | Ht 64.0 in | Wt 147.0 lb

## 2015-03-09 DIAGNOSIS — K219 Gastro-esophageal reflux disease without esophagitis: Secondary | ICD-10-CM

## 2015-03-09 DIAGNOSIS — Z1239 Encounter for other screening for malignant neoplasm of breast: Secondary | ICD-10-CM

## 2015-03-09 DIAGNOSIS — E7800 Pure hypercholesterolemia, unspecified: Secondary | ICD-10-CM

## 2015-03-09 DIAGNOSIS — F439 Reaction to severe stress, unspecified: Secondary | ICD-10-CM

## 2015-03-09 DIAGNOSIS — Z658 Other specified problems related to psychosocial circumstances: Secondary | ICD-10-CM

## 2015-03-09 DIAGNOSIS — R739 Hyperglycemia, unspecified: Secondary | ICD-10-CM

## 2015-03-09 DIAGNOSIS — G479 Sleep disorder, unspecified: Secondary | ICD-10-CM

## 2015-03-09 DIAGNOSIS — I10 Essential (primary) hypertension: Secondary | ICD-10-CM

## 2015-03-09 LAB — BASIC METABOLIC PANEL
BUN: 20 mg/dL (ref 6–23)
CO2: 29 mEq/L (ref 19–32)
Calcium: 9.6 mg/dL (ref 8.4–10.5)
Chloride: 101 mEq/L (ref 96–112)
Creatinine, Ser: 0.75 mg/dL (ref 0.40–1.20)
GFR: 85.17 mL/min (ref >60.00)
Glucose, Bld: 100 mg/dL — ABNORMAL HIGH (ref 70–99)
POTASSIUM: 4.3 meq/L (ref 3.5–5.1)
Sodium: 137 mEq/L (ref 135–145)

## 2015-03-09 LAB — MICROALBUMIN / CREATININE URINE RATIO
Creatinine,U: 138.7 mg/dL
Microalb Creat Ratio: 0.8 mg/g (ref 0.0–30.0)
Microalb, Ur: 1.1 mg/dL (ref 0.0–1.9)

## 2015-03-09 LAB — HEPATIC FUNCTION PANEL
ALT: 23 U/L (ref 0–35)
AST: 20 U/L (ref 0–37)
Albumin: 4.8 g/dL (ref 3.5–5.2)
Alkaline Phosphatase: 101 U/L (ref 39–117)
Bilirubin, Direct: 0.1 mg/dL (ref 0.0–0.3)
TOTAL PROTEIN: 7.5 g/dL (ref 6.0–8.3)
Total Bilirubin: 0.4 mg/dL (ref 0.2–1.2)

## 2015-03-09 LAB — LIPID PANEL
CHOL/HDL RATIO: 4
CHOLESTEROL: 237 mg/dL — AB (ref 0–200)
HDL: 63.1 mg/dL (ref >39.00)
LDL Cholesterol: 136 mg/dL — ABNORMAL HIGH (ref 0–99)
NonHDL: 173.9
TRIGLYCERIDES: 189 mg/dL — AB (ref 0.0–149.0)
VLDL: 37.8 mg/dL (ref 0.0–40.0)

## 2015-03-09 LAB — HEMOGLOBIN A1C: Hgb A1c MFr Bld: 6.4 % (ref 4.6–6.5)

## 2015-03-09 MED ORDER — TRAZODONE HCL 50 MG PO TABS: ORAL_TABLET | ORAL | Status: DC

## 2015-03-09 NOTE — Assessment & Plan Note (Signed)
Trazodone helps her sleep, but she only sleeps 4 hours per day.  Increase to 100mg  q hs.  Advised of possible side effects.  F/u soon to reassess.

## 2015-03-09 NOTE — Progress Notes (Signed)
Pre visit review using our clinic review tool, if applicable. No additional management support is needed unless otherwise documented below in the visit note. 

## 2015-03-09 NOTE — Progress Notes (Signed)
Patient ID: [name], female   DOB: 06-02-1961, 55 y.o.   MRN: [id number]   Subjective:    Patient ID: Rachel Collins, female    DOB: Mar 12, 1961, 54 y.o.   MRN: [id number]  HPI  Patient here for a scheduled follow up.  Has a history of hypertension and hypercholesterolemia.  Here for a scheduled follow up.  Here to f/u on her blood pressure and her sleeping.  She is on trazodone.  Sleeping 4 hours per night.  Discussed increasing the dose.  Handling stress better.  Eating and drinking well.  Trying to stay active.  No cardiac symptoms with increased activity or exertion.  Bowels stable.    Past Medical History  Diagnosis Date  . Hypertension   . Hypercholesterolemia   . Hyperglycemia   . Gastritis   . Esophagitis   . History of migraine headaches   . Gastroparesis     Current Outpatient Prescriptions on File Prior to Visit  Medication Sig Dispense Refill  . amLODipine (NORVASC) 5 MG tablet TAKE 1 TABLET (5 MG TOTAL) BY MOUTH DAILY. 30 tablet 5  . cyclobenzaprine (FLEXERIL) 5 MG tablet TAKE 1 TABLET (5 MG TOTAL) BY MOUTH 3 (THREE) TIMES DAILY AS NEEDED FOR MUSCLE SPASMS. 21 tablet 0  . hydrochlorothiazide (HYDRODIURIL) 25 MG tablet TAKE 1 TABLET BY MOUTH EVERY DAY 30 tablet 5  . losartan (COZAAR) 100 MG tablet TAKE 1 TABLET (100 MG TOTAL) BY MOUTH DAILY. 30 tablet 5  . sertraline (ZOLOFT) 50 MG tablet Take 1 1/2 tablet q day 45 tablet 2   No current facility-administered medications on file prior to visit.    Review of Systems  Constitutional: Negative for appetite change and unexpected weight change.  HENT: Negative for congestion and sinus pressure.   Respiratory: Negative for cough, chest tightness and shortness of breath.   Cardiovascular: Negative for chest pain, palpitations and leg swelling.  Gastrointestinal: Negative for nausea, vomiting, abdominal pain and diarrhea.  Neurological: Negative for dizziness, light-headedness and headaches.  Psychiatric/Behavioral:       Sleeping some better.         Objective:     Blood pressure recheck:  118/78  Physical Exam  Constitutional: She appears well-developed and well-nourished. No distress.  HENT:  Nose: Nose normal.  Mouth/Throat: Oropharynx is clear and moist.  Neck: Neck supple. No thyromegaly present.  Cardiovascular: Normal rate and regular rhythm.   Pulmonary/Chest: Breath sounds normal. No respiratory distress. She has no wheezes.  Abdominal: Soft. Bowel sounds are normal. There is no tenderness.  Musculoskeletal: She exhibits no edema or tenderness.  Lymphadenopathy:    She has no cervical adenopathy.  Skin: No rash noted. No erythema.    BP 110/74 mmHg  Pulse 70  Temp(Src) 98.5 F (36.9 C) (Oral)  Resp 12  Ht $R'5\' 4"'wa$  (1.626 m)  Wt 147 lb (66.679 kg)  BMI 25.22 kg/m2  SpO2 97% Wt Readings from Last 3 Encounters:  03/09/15 147 lb (66.679 kg)  10/31/14 150 lb 12 oz (68.38 kg)  10/03/14 142 lb 12 oz (64.751 kg)     Lab Results  Component Value Date   WBC 7.7 10/12/2014   HGB 14.0 10/12/2014   HCT 42.4 10/12/2014   PLT 278.0 10/12/2014   GLUCOSE 100* 03/09/2015   CHOL 237* 03/09/2015   TRIG 189.0* 03/09/2015   HDL 63.10 03/09/2015   LDLDIRECT 190.4 08/30/2013   LDLCALC 136* 03/09/2015   ALT 23 03/09/2015   AST 20 03/09/2015  NA 137 03/09/2015   K 4.3 03/09/2015   CL 101 03/09/2015   CREATININE 0.75 03/09/2015   BUN 20 03/09/2015   CO2 29 03/09/2015   TSH 2.46 10/12/2014   HGBA1C 6.4 03/09/2015   MICROALBUR 1.1 03/09/2015       Assessment & Plan:   Problem List Items Addressed This Visit    Difficulty sleeping    Trazodone helps her sleep, but she only sleeps 4 hours per day.  Increase to $RemoveBef'100mg'vnCktsLsPB$  q hs.  Advised of possible side effects.  F/u soon to reassess.        GERD (gastroesophageal reflux disease)    On no medication.  No upper symptoms.        Hypercholesterolemia    Low cholesterol diet and exercise.  On pravastatin.  Follow lipid panel and liver  function tests.        Relevant Orders   Lipid panel (Completed)   Hepatic function panel (Completed)   Hyperglycemia    Low carb diet and exercise. Follow met b and a1c.        Relevant Orders   Hemoglobin A1c (Completed)   Microalbumin / creatinine urine ratio (Completed)   Hypertension - Primary    Blood pressure better today.  Same medication regimen.  Follow pressures and follow metabolic panel.        Relevant Orders   Basic metabolic panel (Completed)   Stress    Increased stress with her divorce.  She feels she is doing better overall.  Increase trazodone as outlined.  Follow.         Other Visit Diagnoses    Breast cancer screening        Relevant Orders    MM DIGITAL SCREENING BILATERAL        Einar Pheasant, MD

## 2015-03-12 ENCOUNTER — Other Ambulatory Visit: Payer: Self-pay | Admitting: *Deleted

## 2015-03-12 MED ORDER — PRAVASTATIN SODIUM 40 MG PO TABS: ORAL_TABLET | ORAL | Status: DC

## 2015-03-18 ENCOUNTER — Encounter: Payer: Self-pay | Admitting: Internal Medicine

## 2015-03-18 NOTE — Assessment & Plan Note (Signed)
On no medication.  No upper symptoms.

## 2015-03-18 NOTE — Assessment & Plan Note (Signed)
Blood pressure better today.  Same medication regimen.  Follow pressures and follow metabolic panel.

## 2015-03-18 NOTE — Assessment & Plan Note (Signed)
Increased stress with her divorce.  She feels she is doing better overall.  Increase trazodone as outlined.  Follow.

## 2015-03-18 NOTE — Assessment & Plan Note (Signed)
Low carb diet and exercise. Follow met b and a1c.

## 2015-03-18 NOTE — Assessment & Plan Note (Signed)
Low cholesterol diet and exercise.  On pravastatin.  Follow lipid panel and liver function tests.   

## 2015-05-02 ENCOUNTER — Other Ambulatory Visit: Payer: Self-pay | Admitting: Internal Medicine

## 2015-05-07 ENCOUNTER — Other Ambulatory Visit: Payer: Self-pay | Admitting: Internal Medicine

## 2015-05-07 NOTE — Telephone Encounter (Signed)
Refilled zoloft #45 with 2 refills.   

## 2015-05-07 NOTE — Telephone Encounter (Signed)
Last OV 3.18.16, last refill 11.10.15.  Please advise refill.

## 2015-05-22 ENCOUNTER — Ambulatory Visit (INDEPENDENT_AMBULATORY_CARE_PROVIDER_SITE_OTHER): Payer: Managed Care, Other (non HMO) | Admitting: Internal Medicine

## 2015-05-22 ENCOUNTER — Other Ambulatory Visit (HOSPITAL_COMMUNITY)
Admission: RE | Admit: 2015-05-22 | Discharge: 2015-05-22 | Disposition: A | Discharge status: Home / Self Care | Payer: Managed Care, Other (non HMO) | Source: Ambulatory Visit | Attending: Internal Medicine | Admitting: Internal Medicine

## 2015-05-22 ENCOUNTER — Encounter: Payer: Self-pay | Admitting: Internal Medicine

## 2015-05-22 VITALS — BP 129/82 | HR 74 | Temp 98.0°F | Ht 64.0 in | Wt 149.2 lb

## 2015-05-22 DIAGNOSIS — M79672 Pain in left foot: Secondary | ICD-10-CM

## 2015-05-22 DIAGNOSIS — E7800 Pure hypercholesterolemia, unspecified: Secondary | ICD-10-CM

## 2015-05-22 DIAGNOSIS — F439 Reaction to severe stress, unspecified: Secondary | ICD-10-CM

## 2015-05-22 DIAGNOSIS — M79671 Pain in right foot: Secondary | ICD-10-CM | POA: Insufficient documentation

## 2015-05-22 DIAGNOSIS — Z01419 Encounter for gynecological examination (general) (routine) without abnormal findings: Secondary | ICD-10-CM | POA: Diagnosis present

## 2015-05-22 DIAGNOSIS — I10 Essential (primary) hypertension: Secondary | ICD-10-CM

## 2015-05-22 DIAGNOSIS — Z0000 Encounter for general adult medical examination without abnormal findings: Secondary | ICD-10-CM

## 2015-05-22 DIAGNOSIS — G479 Sleep disorder, unspecified: Secondary | ICD-10-CM

## 2015-05-22 DIAGNOSIS — K219 Gastro-esophageal reflux disease without esophagitis: Secondary | ICD-10-CM

## 2015-05-22 DIAGNOSIS — Z1151 Encounter for screening for human papillomavirus (HPV): Secondary | ICD-10-CM | POA: Diagnosis present

## 2015-05-22 DIAGNOSIS — M79644 Pain in right finger(s): Secondary | ICD-10-CM

## 2015-05-22 DIAGNOSIS — Z658 Other specified problems related to psychosocial circumstances: Secondary | ICD-10-CM

## 2015-05-22 DIAGNOSIS — R739 Hyperglycemia, unspecified: Secondary | ICD-10-CM

## 2015-05-22 DIAGNOSIS — J3489 Other specified disorders of nose and nasal sinuses: Secondary | ICD-10-CM

## 2015-05-22 MED ORDER — AZELASTINE HCL 0.1 % NA SOLN: 1.0000 | Freq: Two times a day (BID) | NASAL | Status: DC

## 2015-05-22 NOTE — Progress Notes (Signed)
Pre visit review using our clinic review tool, if applicable. No additional management support is needed unless otherwise documented below in the visit note. 

## 2015-05-22 NOTE — Progress Notes (Signed)
Patient ID: Rachel Collins, female   DOB: 23-Oct-1961, 54 y.o.   MRN: [id number]   Subjective:    Patient ID: Rachel Collins, female    DOB: Nov 02, 1961, 54 y.o.   MRN: [id number]  HPI  Patient here to follow up on her current medical issues as well as for her physical exam.  Increased stress with her separation/divorce, etc.  Does not feel she needs anything more at this time.  On zoloft.  Does report persistent increased drainage with associated cough.  Taking zyrtec.  On flonase.  No chest congestion.  No sob.  Tries to stay active.  No cardiac symptoms with increased activity or exertion.  Breathing stable.  Eating and drinking well.  Bowels stable.   She also report bilateral posterior heel pain.  Has tried ibuprofen.  Increased pain to touch.  Affecting her walking and activity.  Also report pain at the base of her right thumb.  Uses her hands a lot.  Sleeping better.    Past Medical History  Diagnosis Date  . Hypertension   . Hypercholesterolemia   . Hyperglycemia   . Gastritis   . Esophagitis   . History of migraine headaches   . Gastroparesis     Current Outpatient Prescriptions on File Prior to Visit  Medication Sig Dispense Refill  . amLODipine (NORVASC) 5 MG tablet TAKE 1 TABLET (5 MG TOTAL) BY MOUTH DAILY. 30 tablet 5  . cyclobenzaprine (FLEXERIL) 5 MG tablet TAKE 1 TABLET (5 MG TOTAL) BY MOUTH 3 (THREE) TIMES DAILY AS NEEDED FOR MUSCLE SPASMS. 21 tablet 0  . hydrochlorothiazide (HYDRODIURIL) 25 MG tablet TAKE 1 TABLET BY MOUTH EVERY DAY 30 tablet 5  . losartan (COZAAR) 100 MG tablet TAKE 1 TABLET (100 MG TOTAL) BY MOUTH DAILY. 30 tablet 5  . pravastatin (PRAVACHOL) 40 MG tablet TAKE 1 TABLET BY MOUTH DAILY 30 tablet 5  . sertraline (ZOLOFT) 50 MG tablet TAKE 1 & 1/2 TABLETS BY MOUTH DAILY 45 tablet 2  . traZODone (DESYREL) 50 MG tablet TAKE 1 TO 2 TABLETS EACH NIGHT AT BEDTIME 60 tablet 2   No current facility-administered medications on file prior to visit.    Review of  Systems  Constitutional: Negative for appetite change and unexpected weight change.  HENT: Positive for congestion and postnasal drip. Negative for sinus pressure.   Eyes: Negative for pain and visual disturbance.  Respiratory: Positive for cough (from the increased draiange.). Negative for chest tightness and shortness of breath.   Cardiovascular: Negative for chest pain, palpitations and leg swelling.  Gastrointestinal: Negative for nausea, vomiting, abdominal pain and diarrhea.  Genitourinary: Negative for dysuria and difficulty urinating.  Musculoskeletal: Negative for back pain.       Increased pain base of right thumb.   Also pain - bilateral heels.    Skin: Negative for color change and rash.  Neurological: Negative for dizziness, light-headedness and headaches.  Hematological: Negative for adenopathy. Does not bruise/bleed easily.  Psychiatric/Behavioral: Negative for dysphoric mood and agitation.       Objective:     Blood pressure recheck:  128/78  Physical Exam  Constitutional: She is oriented to person, place, and time. She appears well-developed and well-nourished.  HENT:  Nose: Nose normal.  Mouth/Throat: Oropharynx is clear and moist.  Eyes: Right eye exhibits no discharge. Left eye exhibits no discharge. No scleral icterus.  Neck: Neck supple. No thyromegaly present.  Cardiovascular: Normal rate and regular rhythm.   Pulmonary/Chest: Breath sounds normal. No  accessory muscle usage. No tachypnea. No respiratory distress. She has no decreased breath sounds. She has no wheezes. She has no rhonchi. Right breast exhibits no inverted nipple, no mass, no nipple discharge and no tenderness (no axillary adenopathy). Left breast exhibits no inverted nipple, no mass, no nipple discharge and no tenderness (no axilarry adenopathy).  Abdominal: Soft. Bowel sounds are normal. There is no tenderness.  Genitourinary:  Normal external genitalia.  Vaginal vault without lesions.  Cervix  identified.  Pap smear performed.  Could not appreciate any adnexal masses or tenderness.  Rectal exam - heme negative.    Musculoskeletal: She exhibits no edema.  Increased pain with palpation - bilateral posterior heels.  Also pain - base of right thumb.    Lymphadenopathy:    She has no cervical adenopathy.  Neurological: She is alert and oriented to person, place, and time.  Skin: Skin is warm. No rash noted.  Psychiatric: She has a normal mood and affect. Her behavior is normal.    BP 129/82 mmHg  Pulse 74  Temp(Src) 98 F (36.7 C) (Oral)  Ht _0  (1.626 m)  Wt 149 lb 4 oz (67.699 kg)  BMI 25.61 kg/m2  SpO2 97% Wt Readings from Last 3 Encounters:  05/22/15 149 lb 4 oz (67.699 kg)  03/09/15 147 lb (66.679 kg)  10/31/14 150 lb 12 oz (68.38 kg)     Lab Results  Component Value Date   WBC 7.7 10/12/2014   HGB 14.0 10/12/2014   HCT 42.4 10/12/2014   PLT 278.0 10/12/2014   GLUCOSE 100* 03/09/2015   CHOL 237* 03/09/2015   TRIG 189.0* 03/09/2015   HDL 63.10 03/09/2015   LDLDIRECT 190.4 08/30/2013   LDLCALC 136* 03/09/2015   ALT 23 03/09/2015   AST 20 03/09/2015   NA 137 03/09/2015   K 4.3 03/09/2015   CL 101 03/09/2015   CREATININE 0.75 03/09/2015   BUN 20 03/09/2015   CO2 29 03/09/2015   TSH 2.46 10/12/2014   HGBA1C 6.4 03/09/2015   MICROALBUR 1.1 03/09/2015       Assessment & Plan:   Problem List Items Addressed This Visit    Difficulty sleeping    Doing better on increased dose of trazodone.  Follow.       GERD (gastroesophageal reflux disease)    No upper symptoms.  On no medication now.  Follow.       Health care maintenance    Physical today 05/22/15.  PAP 05/22/15.  Mammogram scheduled today.  Colonoscopy 12/09/04 - normal.        Heel pain, bilateral - Primary    Persistent.  Has tried ibuprofen.  Increased pain with palpation.  Refer to podiatry for evaluation.        Relevant Orders   Ambulatory referral to Podiatry   Cytology - PAP    Hypercholesterolemia    On pravastatin.  Recently increased to 23m q day.  Follow lipid panel and liver function tests.  Low cholesterol diet and exercise.       Relevant Orders   Lipid panel   Hepatic function panel   Hyperglycemia    Low carb diet and exercise.  Follow met b and a1c.       Relevant Orders   Hemoglobin A1c   Hypertension    Blood pressure doing well on current medication regimen.  Follow pressures.  Follow metabolic panel.        Relevant Orders   TSH   Basic metabolic panel  Nasal drainage    Persistent.  On flonase and zyrtec.  Trial of astelin nasal spray.  Notify me if persistent.       Pain of right thumb    Persistent pain - right thumb.  Thumb spica splint.  Follow.       Stress    Increased stress as outlined.  On zoloft.  Follow.       Relevant Orders   CBC with Differential/Platelet     I spent 25 minutes with the patient and more than 50% of the time was spent in consultation regarding the above.     Einar Pheasant, MD

## 2015-05-23 ENCOUNTER — Encounter: Payer: Self-pay | Admitting: Internal Medicine

## 2015-05-23 DIAGNOSIS — J3489 Other specified disorders of nose and nasal sinuses: Secondary | ICD-10-CM | POA: Insufficient documentation

## 2015-05-23 DIAGNOSIS — M79644 Pain in right finger(s): Secondary | ICD-10-CM | POA: Insufficient documentation

## 2015-05-23 DIAGNOSIS — Z0000 Encounter for general adult medical examination without abnormal findings: Secondary | ICD-10-CM | POA: Insufficient documentation

## 2015-05-23 NOTE — Assessment & Plan Note (Signed)
Blood pressure doing well on current medication regimen.  Follow pressures.  Follow metabolic panel.  

## 2015-05-23 NOTE — Assessment & Plan Note (Signed)
Physical today 05/22/15.  PAP 05/22/15.  Mammogram scheduled today.  Colonoscopy 12/09/04 - normal.

## 2015-05-23 NOTE — Assessment & Plan Note (Signed)
Doing better on increased dose of trazodone.  Follow.

## 2015-05-23 NOTE — Assessment & Plan Note (Signed)
Low carb diet and exercise.  Follow met b and a1c.  

## 2015-05-23 NOTE — Assessment & Plan Note (Signed)
On pravastatin.  Recently increased to 40mg  q day.  Follow lipid panel and liver function tests.  Low cholesterol diet and exercise.

## 2015-05-23 NOTE — Assessment & Plan Note (Signed)
Increased stress as outlined.  On zoloft.  Follow.  °

## 2015-05-23 NOTE — Assessment & Plan Note (Signed)
No upper symptoms.  On no medication now.  Follow.

## 2015-05-23 NOTE — Assessment & Plan Note (Signed)
Persistent.  Has tried ibuprofen.  Increased pain with palpation.  Refer to podiatry for evaluation.

## 2015-05-23 NOTE — Assessment & Plan Note (Signed)
Persistent pain - right thumb.  Thumb spica splint.  Follow.

## 2015-05-23 NOTE — Assessment & Plan Note (Signed)
Persistent.  On flonase and zyrtec.  Trial of astelin nasal spray.  Notify me if persistent.

## 2015-05-24 LAB — CYTOLOGY - PAP

## 2015-05-29 ENCOUNTER — Ambulatory Visit
Admission: RE | Admit: 2015-05-29 | Discharge: 2015-05-29 | Disposition: A | Discharge status: Home / Self Care | Payer: Managed Care, Other (non HMO) | Source: Ambulatory Visit | Attending: Internal Medicine | Admitting: Internal Medicine

## 2015-05-29 DIAGNOSIS — Z1231 Encounter for screening mammogram for malignant neoplasm of breast: Secondary | ICD-10-CM | POA: Insufficient documentation

## 2015-05-29 DIAGNOSIS — Z1239 Encounter for other screening for malignant neoplasm of breast: Secondary | ICD-10-CM

## 2015-06-04 ENCOUNTER — Encounter: Payer: Self-pay | Admitting: Podiatry

## 2015-06-04 ENCOUNTER — Encounter: Payer: Self-pay | Admitting: Internal Medicine

## 2015-06-04 ENCOUNTER — Ambulatory Visit (INDEPENDENT_AMBULATORY_CARE_PROVIDER_SITE_OTHER): Payer: Managed Care, Other (non HMO) | Admitting: Podiatry

## 2015-06-04 ENCOUNTER — Ambulatory Visit (INDEPENDENT_AMBULATORY_CARE_PROVIDER_SITE_OTHER): Payer: Managed Care, Other (non HMO)

## 2015-06-04 VITALS — BP 162/106 | HR 80 | Resp 16

## 2015-06-04 DIAGNOSIS — M25472 Effusion, left ankle: Secondary | ICD-10-CM

## 2015-06-04 DIAGNOSIS — M25475 Effusion, left foot: Secondary | ICD-10-CM | POA: Diagnosis not present

## 2015-06-04 DIAGNOSIS — M7661 Achilles tendinitis, right leg: Secondary | ICD-10-CM

## 2015-06-04 DIAGNOSIS — M25572 Pain in left ankle and joints of left foot: Secondary | ICD-10-CM | POA: Diagnosis not present

## 2015-06-04 DIAGNOSIS — R52 Pain, unspecified: Secondary | ICD-10-CM

## 2015-06-04 MED ORDER — METHYLPREDNISOLONE 4 MG PO TBPK: ORAL_TABLET | ORAL | Status: DC

## 2015-06-04 MED ORDER — MELOXICAM 15 MG PO TABS: 15.0000 mg | ORAL_TABLET | Freq: Every day | ORAL | Status: DC

## 2015-06-04 NOTE — Progress Notes (Signed)
° °  Subjective:    Patient ID: Rachel Collins, female    DOB: 01/28/1961, 54 y.o.   MRN: [id number]  HPI   N: Sharp pain L: Left foot heel D: O: Gradual C: hard to walk, sharp pain when sitting or laying down, sensetive to touch A: Walking, shoes, pressure T: Ibphr., tries staying off of it   Review of Systems  All other systems reviewed and are negative.      Objective:   Physical Exam: I have reviewed her past medical history medications allergy surgery social history and review of systems. Pulses are strongly palpable bilateral. Neurologic sensorium is intact versus once the monofilament. Deep tendon reflexes are intact bilateral and muscle strength +5 over 5 dorsiflexion plantar flexors and inverters and everters all of his musculature is intact. Orthopedic evaluation demonstrates pain on palpation of the Achilles at its insertion on the left heel. Radiographs do demonstrate thickening of the Achilles with soft tissue increase in density at its insertion site on the left heel. No calcaneal fractures are noted.        Assessment & Plan:  Assessment: Insertional Achilles tendinitis left.  Plan: Start her on a Medrol Dosepak to be followed by meloxicam. Encouraged her to ice this as much as possible. Also placed her in a Cam Walker and I injected 2 mg of dexamethasone subcutaneously into the area. Making sure not to inject into the tendon itself. She is also decrease her activities and follow up with me in 1 month

## 2015-06-04 NOTE — Patient Instructions (Signed)
Achilles Tendinitis   with Rehab  Achilles tendinitis is a disorder of the Achilles tendon. The Achilles tendon connects the large calf muscles (Gastrocnemius and Soleus) to the heel bone (calcaneus). This tendon is sometimes called the heel cord. It is important for pushing-off and standing on your toes and is important for walking, running, or jumping. Tendinitis is often caused by overuse and repetitive microtrauma.  SYMPTOMS  · Pain, tenderness, swelling, warmth, and redness may occur over the Achilles tendon even at rest.  · Pain with pushing off, or flexing or extending the ankle.  · Pain that is worsened after or during activity.  CAUSES   · Overuse sometimes seen with rapid increase in exercise programs or in sports requiring running and jumping.  · Poor physical conditioning (strength and flexibility or endurance).  · Running sports, especially training running down hills.  · Inadequate warm-up before practice or play or failure to stretch before participation.  · Injury to the tendon.  PREVENTION   · Warm up and stretch before practice or competition.  · Allow time for adequate rest and recovery between practices and competition.  · Keep up conditioning.  ¨ Keep up ankle and leg flexibility.  ¨ Improve or keep muscle strength and endurance.  ¨ Improve cardiovascular fitness.  · Use proper technique.  · Use proper equipment (shoes, skates).  · To help prevent recurrence, taping, protective strapping, or an adhesive bandage may be recommended for several weeks after healing is complete.  PROGNOSIS   · Recovery may take weeks to several months to heal.  · Longer recovery is expected if symptoms have been prolonged.  · Recovery is usually quicker if the inflammation is due to a direct blow as compared with overuse or sudden strain.  RELATED COMPLICATIONS   · Healing time will be prolonged if the condition is not correctly treated. The injury must be given plenty of time to heal.  · Symptoms can reoccur if  activity is resumed too soon.  · Untreated, tendinitis may increase the risk of tendon rupture requiring additional time for recovery and possibly surgery.  TREATMENT   · The first treatment consists of rest anti-inflammatory medication, and ice to relieve the pain.  · Stretching and strengthening exercises after resolution of pain will likely help reduce the risk of recurrence. Referral to a physical therapist or athletic trainer for further evaluation and treatment may be helpful.  · A walking boot or cast may be recommended to rest the Achilles tendon. This can help break the cycle of inflammation and microtrauma.  · Arch supports (orthotics) may be prescribed or recommended by your caregiver as an adjunct to therapy and rest.  · Surgery to remove the inflamed tendon lining or degenerated tendon tissue is rarely necessary and has shown less than predictable results.  MEDICATION   · Nonsteroidal anti-inflammatory medications, such as aspirin and ibuprofen, may be used for pain and inflammation relief. Do not take within 7 days before surgery. Take these as directed by your caregiver. Contact your caregiver immediately if any bleeding, stomach upset, or signs of allergic reaction occur. Other minor pain relievers, such as acetaminophen, may also be used.  · Pain relievers may be prescribed as necessary by your caregiver. Do not take prescription pain medication for longer than 4 to 7 days. Use only as directed and only as much as you need.  · Cortisone injections are rarely indicated. Cortisone injections may weaken tendons and predispose to rupture. It is better   to give the condition more time to heal than to use them.  HEAT AND COLD  · Cold is used to relieve pain and reduce inflammation for acute and chronic Achilles tendinitis. Cold should be applied for 10 to 15 minutes every 2 to 3 hours for inflammation and pain and immediately after any activity that aggravates your symptoms. Use ice packs or an ice  massage.  · Heat may be used before performing stretching and strengthening activities prescribed by your caregiver. Use a heat pack or a warm soak.  SEEK MEDICAL CARE IF:  · Symptoms get worse or do not improve in 2 weeks despite treatment.  · New, unexplained symptoms develop. Drugs used in treatment may produce side effects.  EXERCISES  RANGE OF MOTION (ROM) AND STRETCHING EXERCISES - Achilles Tendinitis   These exercises may help you when beginning to rehabilitate your injury. Your symptoms may resolve with or without further involvement from your physician, physical therapist or athletic trainer. While completing these exercises, remember:   · Restoring tissue flexibility helps normal motion to return to the joints. This allows healthier, less painful movement and activity.  · An effective stretch should be held for at least 30 seconds.  · A stretch should never be painful. You should only feel a gentle lengthening or release in the stretched tissue.  STRETCH - Gastroc, Standing   · Place hands on wall.  · Extend right / left leg, keeping the front knee somewhat bent.  · Slightly point your toes inward on your back foot.  · Keeping your right / left heel on the floor and your knee straight, shift your weight toward the wall, not allowing your back to arch.  · You should feel a gentle stretch in the right / left calf. Hold this position for __________ seconds.  Repeat __________ times. Complete this stretch __________ times per day.  STRETCH - Soleus, Standing   · Place hands on wall.  · Extend right / left leg, keeping the other knee somewhat bent.  · Slightly point your toes inward on your back foot.  · Keep your right / left heel on the floor, bend your back knee, and slightly shift your weight over the back leg so that you feel a gentle stretch deep in your back calf.  · Hold this position for __________ seconds.  Repeat __________ times. Complete this stretch __________ times per day.  STRETCH -  Gastrocsoleus, Standing   Note: This exercise can place a lot of stress on your foot and ankle. Please complete this exercise only if specifically instructed by your caregiver.   · Place the ball of your right / left foot on a step, keeping your other foot firmly on the same step.  · Hold on to the wall or a rail for balance.  · Slowly lift your other foot, allowing your body weight to press your heel down over the edge of the step.  · You should feel a stretch in your right / left calf.  · Hold this position for __________ seconds.  · Repeat this exercise with a slight bend in your knee.  Repeat __________ times. Complete this stretch __________ times per day.   STRENGTHENING EXERCISES - Achilles Tendinitis  These exercises may help you when beginning to rehabilitate your injury. They may resolve your symptoms with or without further involvement from your physician, physical therapist or athletic trainer. While completing these exercises, remember:   · Muscles can gain both the endurance   and the strength needed for everyday activities through controlled exercises.  · Complete these exercises as instructed by your physician, physical therapist or athletic trainer. Progress the resistance and repetitions only as guided.  · You may experience muscle soreness or fatigue, but the pain or discomfort you are trying to eliminate should never worsen during these exercises. If this pain does worsen, stop and make certain you are following the directions exactly. If the pain is still present after adjustments, discontinue the exercise until you can discuss the trouble with your clinician.  STRENGTH - Plantar-flexors   · Sit with your right / left leg extended. Holding onto both ends of a rubber exercise band/tubing, loop it around the ball of your foot. Keep a slight tension in the band.  · Slowly push your toes away from you, pointing them downward.  · Hold this position for __________ seconds. Return slowly, controlling the  tension in the band/tubing.  Repeat __________ times. Complete this exercise __________ times per day.   STRENGTH - Plantar-flexors   · Stand with your feet shoulder width apart. Steady yourself with a wall or table using as little support as needed.  · Keeping your weight evenly spread over the width of your feet, rise up on your toes.*  · Hold this position for __________ seconds.  Repeat __________ times. Complete this exercise __________ times per day.   *If this is too easy, shift your weight toward your right / left leg until you feel challenged. Ultimately, you may be asked to do this exercise with your right / left foot only.  STRENGTH - Plantar-flexors, Eccentric   Note: This exercise can place a lot of stress on your foot and ankle. Please complete this exercise only if specifically instructed by your caregiver.   · Place the balls of your feet on a step. With your hands, use only enough support from a wall or rail to keep your balance.  · Keep your knees straight and rise up on your toes.  · Slowly shift your weight entirely to your right / left toes and pick up your opposite foot. Gently and with controlled movement, lower your weight through your right / left foot so that your heel drops below the level of the step. You will feel a slight stretch in the back of your calf at the end position.  · Use the healthy leg to help rise up onto the balls of both feet, then lower weight only on the right / left leg again. Build up to 15 repetitions. Then progress to 3 consecutive sets of 15 repetitions.*  · After completing the above exercise, complete the same exercise with a slight knee bend (about 30 degrees). Again, build up to 15 repetitions. Then progress to 3 consecutive sets of 15 repetitions.*  Perform this exercise __________ times per day.   *When you easily complete 3 sets of 15, your physician, physical therapist or athletic trainer may advise you to add resistance by wearing a backpack filled with  additional weight.  STRENGTH - Plantar Flexors, Seated   · Sit on a chair that allows your feet to rest flat on the ground. If necessary, sit at the edge of the chair.  · Keeping your toes firmly on the ground, lift your right / left heel as far as you can without increasing any discomfort in your ankle.  Repeat __________ times. Complete this exercise __________ times a day.  *If instructed by your physician, physical therapist or athletic   trainer, you may add ____________________ of resistance by placing a weighted object on your right / left knee.  Document Released: 07/09/2005 Document Revised: 03/01/2012 Document Reviewed: 03/22/2009  [employer]® Patient Information ©2015 [employer]. This information is not intended to replace advice given to you by your health care provider. Make sure you discuss any questions you have with your health care provider.

## 2015-06-06 NOTE — Telephone Encounter (Signed)
Unread mychart message mailed to patient 

## 2015-06-26 ENCOUNTER — Other Ambulatory Visit: Payer: Self-pay | Admitting: Internal Medicine

## 2015-07-11 ENCOUNTER — Ambulatory Visit: Payer: Managed Care, Other (non HMO) | Admitting: Podiatry

## 2015-07-13 ENCOUNTER — Other Ambulatory Visit: Payer: Self-pay | Admitting: Internal Medicine

## 2015-07-18 ENCOUNTER — Ambulatory Visit: Payer: Managed Care, Other (non HMO) | Admitting: Podiatry

## 2015-08-16 ENCOUNTER — Other Ambulatory Visit (INDEPENDENT_AMBULATORY_CARE_PROVIDER_SITE_OTHER): Payer: Managed Care, Other (non HMO)

## 2015-08-16 DIAGNOSIS — F439 Reaction to severe stress, unspecified: Secondary | ICD-10-CM

## 2015-08-16 DIAGNOSIS — R739 Hyperglycemia, unspecified: Secondary | ICD-10-CM | POA: Diagnosis not present

## 2015-08-16 DIAGNOSIS — I10 Essential (primary) hypertension: Secondary | ICD-10-CM | POA: Diagnosis not present

## 2015-08-16 DIAGNOSIS — E7800 Pure hypercholesterolemia, unspecified: Secondary | ICD-10-CM

## 2015-08-16 DIAGNOSIS — Z658 Other specified problems related to psychosocial circumstances: Secondary | ICD-10-CM

## 2015-08-16 DIAGNOSIS — E785 Hyperlipidemia, unspecified: Secondary | ICD-10-CM | POA: Diagnosis not present

## 2015-08-16 LAB — CBC WITH DIFFERENTIAL/PLATELET
BASOS ABS: 0.1 10*3/uL (ref 0.0–0.1)
Basophils Relative: 0.7 % (ref 0.0–3.0)
EOS ABS: 0.4 10*3/uL (ref 0.0–0.7)
Eosinophils Relative: 4.7 % (ref 0.0–5.0)
HCT: 44.1 % (ref 36.0–46.0)
HEMOGLOBIN: 14.8 g/dL (ref 12.0–15.0)
LYMPHS ABS: 2.2 10*3/uL (ref 0.7–4.0)
Lymphocytes Relative: 25.9 % (ref 12.0–46.0)
MCHC: 33.5 g/dL (ref 30.0–36.0)
MCV: 91.1 fl (ref 78.0–100.0)
MONO ABS: 0.7 10*3/uL (ref 0.1–1.0)
Monocytes Relative: 8.5 % (ref 3.0–12.0)
NEUTROS PCT: 60.2 % (ref 43.0–77.0)
Neutro Abs: 5 10*3/uL (ref 1.4–7.7)
Platelets: 306 10*3/uL (ref 150.0–400.0)
RBC: 4.84 Mil/uL (ref 3.87–5.11)
RDW: 14.1 % (ref 11.5–15.5)
WBC: 8.4 10*3/uL (ref 4.0–10.5)

## 2015-08-16 LAB — HEPATIC FUNCTION PANEL
ALBUMIN: 4.9 g/dL (ref 3.5–5.2)
ALK PHOS: 92 U/L (ref 39–117)
ALT: 33 U/L (ref 0–35)
AST: 26 U/L (ref 0–37)
Bilirubin, Direct: 0.1 mg/dL (ref 0.0–0.3)
Total Bilirubin: 0.3 mg/dL (ref 0.2–1.2)
Total Protein: 7.8 g/dL (ref 6.0–8.3)

## 2015-08-16 LAB — LIPID PANEL
CHOLESTEROL: 277 mg/dL — AB (ref 0–200)
HDL: 69.2 mg/dL (ref >39.00)
LDL Cholesterol: 174 mg/dL — ABNORMAL HIGH (ref 0–99)
NONHDL: 207.54
Total CHOL/HDL Ratio: 4
Triglycerides: 168 mg/dL — ABNORMAL HIGH (ref 0.0–149.0)
VLDL: 33.6 mg/dL (ref 0.0–40.0)

## 2015-08-16 LAB — BASIC METABOLIC PANEL
BUN: 19 mg/dL (ref 6–23)
CALCIUM: 10.6 mg/dL — AB (ref 8.4–10.5)
CHLORIDE: 101 meq/L (ref 96–112)
CO2: 28 meq/L (ref 19–32)
CREATININE: 0.75 mg/dL (ref 0.40–1.20)
GFR: 85.04 mL/min (ref >60.00)
Glucose, Bld: 112 mg/dL — ABNORMAL HIGH (ref 70–99)
Potassium: 4.7 mEq/L (ref 3.5–5.1)
Sodium: 138 mEq/L (ref 135–145)

## 2015-08-16 LAB — HEMOGLOBIN A1C: HEMOGLOBIN A1C: 6.1 % (ref 4.6–6.5)

## 2015-08-16 LAB — TSH: TSH: 3.69 u[IU]/mL (ref 0.35–4.50)

## 2015-08-17 ENCOUNTER — Encounter: Payer: Self-pay | Admitting: Internal Medicine

## 2015-08-20 NOTE — Telephone Encounter (Signed)
Unread mychart message mailed to patient 

## 2015-08-21 ENCOUNTER — Encounter: Payer: Self-pay | Admitting: Internal Medicine

## 2015-08-21 ENCOUNTER — Ambulatory Visit (INDEPENDENT_AMBULATORY_CARE_PROVIDER_SITE_OTHER): Payer: Managed Care, Other (non HMO) | Admitting: Internal Medicine

## 2015-08-21 VITALS — BP 110/78 | HR 78 | Temp 98.2°F | Ht 64.0 in | Wt 142.1 lb

## 2015-08-21 DIAGNOSIS — E7800 Pure hypercholesterolemia, unspecified: Secondary | ICD-10-CM

## 2015-08-21 DIAGNOSIS — Z23 Encounter for immunization: Secondary | ICD-10-CM | POA: Diagnosis not present

## 2015-08-21 DIAGNOSIS — M79671 Pain in right foot: Secondary | ICD-10-CM | POA: Diagnosis not present

## 2015-08-21 DIAGNOSIS — G479 Sleep disorder, unspecified: Secondary | ICD-10-CM | POA: Diagnosis not present

## 2015-08-21 DIAGNOSIS — I10 Essential (primary) hypertension: Secondary | ICD-10-CM

## 2015-08-21 DIAGNOSIS — F439 Reaction to severe stress, unspecified: Secondary | ICD-10-CM

## 2015-08-21 DIAGNOSIS — M79672 Pain in left foot: Secondary | ICD-10-CM

## 2015-08-21 DIAGNOSIS — R739 Hyperglycemia, unspecified: Secondary | ICD-10-CM

## 2015-08-21 DIAGNOSIS — Z658 Other specified problems related to psychosocial circumstances: Secondary | ICD-10-CM

## 2015-08-21 MED ORDER — ROSUVASTATIN CALCIUM 10 MG PO TABS: 10.0000 mg | ORAL_TABLET | Freq: Every day | ORAL | Status: DC

## 2015-08-21 NOTE — Progress Notes (Signed)
Pre-visit discussion using our clinic review tool. No additional management support is needed unless otherwise documented below in the visit note.  

## 2015-08-21 NOTE — Progress Notes (Signed)
Patient ID: Rachel Collins, female   DOB: [birth year], 54 y.o.   MRN: [id number]   Subjective:    Patient ID: Rachel Collins, female    DOB: 01/30/1961, 54 y.o.   MRN: [id number]  HPI  Patient here for a scheduled follow up.  Still having pain in her achilles tendon.  Seeing Dr Milinda Pointer.  See his note for details.  S/p injection.  Helped some.  She is trying to walk.  Watching her diet.  Tries to stay active.  No cardiac symptoms with increased activity or exertion.  No sob.  Eating and drinking well.  No acid reflux reported.  Bowels doing well.  Handling stress.  Does not feel she needs any further intervention.  Blood pressure doing ok.  Highest reading 140/80.  On average, running lower.  Cholesterol still elevated.  Discussed treatment.    Past Medical History  Diagnosis Date  . Hypertension   . Hypercholesterolemia   . Hyperglycemia   . Gastritis   . Esophagitis   . History of migraine headaches   . Gastroparesis    Past Surgical History  Procedure Laterality Date  . Tubal ligation    . Cervical disc surgery      C5-C7 fusion  . Breast biopsy Left     benign   Family History  Problem Relation Age of Onset  . Heart disease Father     myocardial infarction x 5  . Prostate cancer Father   . Uterine cancer Mother   . COPD Mother   . Diabetes Mother   . Heart disease Mother     myocardial infarction  . Breast cancer Mother 64  . Heart disease Paternal Grandmother   . Heart disease Maternal Grandfather    Social History   Social History  . Marital Status: Married    Spouse Name: N/A  . Number of Children: 1  . Years of Education: N/A   Social History Main Topics  . Smoking status: Never Smoker   . Smokeless tobacco: Never Used  . Alcohol Use: 0.0 oz/week    0 Standard drinks or equivalent per week  . Drug Use: No  . Sexual Activity: Not Asked   Other Topics Concern  . None   Social History Narrative    Outpatient Encounter Prescriptions as of 08/21/2015    Medication Sig  . amLODipine (NORVASC) 5 MG tablet TAKE 1 TABLET (5 MG TOTAL) BY MOUTH DAILY.  Marland Kitchen azelastine (ASTELIN) 0.1 % nasal spray Place 1 spray into both nostrils 2 (two) times daily. Use in each nostril as directed  . hydrochlorothiazide (HYDRODIURIL) 25 MG tablet TAKE 1 TABLET BY MOUTH EVERY DAY  . losartan (COZAAR) 100 MG tablet TAKE 1 TABLET (100 MG TOTAL) BY MOUTH DAILY.  . meloxicam (MOBIC) 15 MG tablet Take 1 tablet (15 mg total) by mouth daily.  . sertraline (ZOLOFT) 50 MG tablet TAKE 1 & 1/2 TABLETS BY MOUTH DAILY  . traZODone (DESYREL) 50 MG tablet TAKE 1 TO 2 TABLETS EACH NIGHT AT BEDTIME  . [DISCONTINUED] cyclobenzaprine (FLEXERIL) 5 MG tablet TAKE 1 TABLET (5 MG TOTAL) BY MOUTH 3 (THREE) TIMES DAILY AS NEEDED FOR MUSCLE SPASMS.  . [DISCONTINUED] methylPREDNISolone (MEDROL) 4 MG TBPK tablet Tapering 6 day dose pack  . [DISCONTINUED] pravastatin (PRAVACHOL) 40 MG tablet TAKE 1 TABLET BY MOUTH DAILY  . rosuvastatin (CRESTOR) 10 MG tablet Take 1 tablet (10 mg total) by mouth daily.   No facility-administered encounter medications on file as of  08/21/2015.    Review of Systems  Constitutional: Negative for appetite change and unexpected weight change.  HENT: Negative for congestion and sinus pressure.   Eyes: Negative for discharge and visual disturbance.  Respiratory: Negative for cough, chest tightness and shortness of breath.   Cardiovascular: Negative for chest pain, palpitations and leg swelling.  Gastrointestinal: Negative for nausea, vomiting, abdominal pain and diarrhea.  Genitourinary: Negative for dysuria and difficulty urinating.  Musculoskeletal: Negative for back pain and joint swelling.  Skin: Negative for color change and rash.  Neurological: Negative for dizziness, light-headedness and headaches.  Hematological: Negative for adenopathy. Does not bruise/bleed easily.  Psychiatric/Behavioral: Negative for dysphoric mood and agitation.       Objective:      Blood pressure rechecked by me:  124/78  Physical Exam  Constitutional: She appears well-developed and well-nourished. No distress.  HENT:  Nose: Nose normal.  Mouth/Throat: Oropharynx is clear and moist.  Eyes: Conjunctivae are normal. Right eye exhibits no discharge. Left eye exhibits no discharge.  Neck: Neck supple. No thyromegaly present.  Cardiovascular: Normal rate and regular rhythm.   Pulmonary/Chest: Breath sounds normal. No respiratory distress. She has no wheezes.  Abdominal: Soft. Bowel sounds are normal. There is no tenderness.  Musculoskeletal: She exhibits no edema or tenderness.  Lymphadenopathy:    She has no cervical adenopathy.  Skin: No rash noted. No erythema.  Psychiatric: She has a normal mood and affect. Her behavior is normal.    BP 110/78 mmHg  Pulse 78  Temp(Src) 98.2 F (36.8 C) (Oral)  Ht _0  (1.626 m)  Wt 142 lb 2 oz (64.467 kg)  BMI 24.38 kg/m2  SpO2 95% Wt Readings from Last 3 Encounters:  08/21/15 142 lb 2 oz (64.467 kg)  05/22/15 149 lb 4 oz (67.699 kg)  03/09/15 147 lb (66.679 kg)     Lab Results  Component Value Date   WBC 8.4 08/16/2015   HGB 14.8 08/16/2015   HCT 44.1 08/16/2015   PLT 306.0 08/16/2015   GLUCOSE 112* 08/16/2015   CHOL 277* 08/16/2015   TRIG 168.0* 08/16/2015   HDL 69.20 08/16/2015   LDLDIRECT 190.4 08/30/2013   LDLCALC 174* 08/16/2015   ALT 33 08/16/2015   AST 26 08/16/2015   NA 138 08/16/2015   K 4.7 08/16/2015   CL 101 08/16/2015   CREATININE 0.75 08/16/2015   BUN 19 08/16/2015   CO2 28 08/16/2015   TSH 3.69 08/16/2015   HGBA1C 6.1 08/16/2015   MICROALBUR 1.1 03/09/2015    Mm Digital Screening Bilateral  05/29/2015   CLINICAL DATA:  Screening.  EXAM: DIGITAL SCREENING BILATERAL MAMMOGRAM WITH CAD  COMPARISON:  Previous exam(s).  ACR Breast Density Category c: The breast tissue is heterogeneously dense, which may obscure small masses.  FINDINGS: There are no findings suspicious for malignancy.  Images were processed with CAD.  IMPRESSION: No mammographic evidence of malignancy. A result letter of this screening mammogram will be mailed directly to the patient.  RECOMMENDATION: Screening mammogram in one year. (Code:SM-B-01Y)  BI-RADS CATEGORY  1: Negative.   Electronically Signed   By: Everlean Alstrom M.D.   On: 05/29/2015 16:09       Assessment & Plan:   Problem List Items Addressed This Visit    Difficulty sleeping    Doing better on trazodone.  Follow.       Heel pain, bilateral    Seeing Dr Milinda Pointer.  See his note for details.  Follow.  Hypercholesterolemia    Persistent elevation.  Stop pravastatin.  Start crestor 43m q day.  Follow lipid panel and liver function tests.  Follow.        Relevant Medications   rosuvastatin (CRESTOR) 10 MG tablet   Other Relevant Orders   Hepatic function panel   Hyperglycemia    Low carb diet and exercise.  Follow met b and a1c.  a1c just checked 6.1.        Hypertension    Blood pressure under good control.  Continue same medication regimen.  Follow pressures.  Follow metabolic panel.        Relevant Medications   rosuvastatin (CRESTOR) 10 MG tablet   Stress    Increased stress.  On zoloft.  Does not feel she needs any further intervention at this time.  Follow.         Other Visit Diagnoses    Encounter for immunization    -  Primary        SEinar Pheasant MD

## 2015-08-27 ENCOUNTER — Encounter: Payer: Self-pay | Admitting: Internal Medicine

## 2015-08-27 NOTE — Assessment & Plan Note (Signed)
Persistent elevation.  Stop pravastatin.  Start crestor  q day.  Follow lipid panel and liver function tests.  Follow.

## 2015-08-27 NOTE — Assessment & Plan Note (Signed)
Doing better on trazodone.  Follow.  

## 2015-08-27 NOTE — Assessment & Plan Note (Signed)
Low carb diet and exercise.  Follow met b and a1c.  a1c just checked 6.1.

## 2015-08-27 NOTE — Assessment & Plan Note (Signed)
Increased stress.  On zoloft.  Does not feel she needs any further intervention at this time.  Follow.

## 2015-08-27 NOTE — Assessment & Plan Note (Signed)
Blood pressure under good control.  Continue same medication regimen.  Follow pressures.  Follow metabolic panel.   

## 2015-08-27 NOTE — Assessment & Plan Note (Signed)
Seeing Dr Al Corpus.  See his note for details.  Follow.

## 2015-09-04 ENCOUNTER — Ambulatory Visit: Payer: Managed Care, Other (non HMO) | Admitting: Family Medicine

## 2015-09-06 ENCOUNTER — Other Ambulatory Visit: Payer: Self-pay | Admitting: Internal Medicine

## 2015-09-07 ENCOUNTER — Other Ambulatory Visit: Payer: Self-pay

## 2015-09-07 MED ORDER — TRAZODONE HCL 50 MG PO TABS: ORAL_TABLET | ORAL | Status: DC

## 2015-09-07 NOTE — Telephone Encounter (Signed)
Received a refill request for Trazodone. Last refilled 05/02/15 for #60 with 2 refills. Ok to refill this medication?

## 2015-09-07 NOTE — Telephone Encounter (Signed)
rx refilled for trazodone #60 with 2 refills.

## 2015-10-02 ENCOUNTER — Other Ambulatory Visit: Payer: Managed Care, Other (non HMO)

## 2015-10-26 ENCOUNTER — Other Ambulatory Visit: Payer: Self-pay | Admitting: Internal Medicine

## 2015-11-26 ENCOUNTER — Other Ambulatory Visit (INDEPENDENT_AMBULATORY_CARE_PROVIDER_SITE_OTHER): Payer: Managed Care, Other (non HMO)

## 2015-11-26 DIAGNOSIS — E7800 Pure hypercholesterolemia, unspecified: Secondary | ICD-10-CM | POA: Diagnosis not present

## 2015-11-26 LAB — HEPATIC FUNCTION PANEL
ALK PHOS: 84 U/L (ref 39–117)
ALT: 24 U/L (ref 0–35)
AST: 22 U/L (ref 0–37)
Albumin: 4.5 g/dL (ref 3.5–5.2)
Bilirubin, Direct: 0.1 mg/dL (ref 0.0–0.3)
TOTAL PROTEIN: 7.1 g/dL (ref 6.0–8.3)
Total Bilirubin: 0.4 mg/dL (ref 0.2–1.2)

## 2015-11-27 ENCOUNTER — Ambulatory Visit (INDEPENDENT_AMBULATORY_CARE_PROVIDER_SITE_OTHER): Payer: Managed Care, Other (non HMO) | Admitting: Internal Medicine

## 2015-11-27 ENCOUNTER — Encounter: Payer: Self-pay | Admitting: Internal Medicine

## 2015-11-27 ENCOUNTER — Other Ambulatory Visit: Payer: Self-pay | Admitting: Internal Medicine

## 2015-11-27 ENCOUNTER — Other Ambulatory Visit (HOSPITAL_COMMUNITY)
Admission: RE | Admit: 2015-11-27 | Discharge: 2015-11-27 | Disposition: A | Discharge status: Home / Self Care | Payer: Managed Care, Other (non HMO) | Source: Ambulatory Visit | Attending: Internal Medicine | Admitting: Internal Medicine

## 2015-11-27 ENCOUNTER — Other Ambulatory Visit (INDEPENDENT_AMBULATORY_CARE_PROVIDER_SITE_OTHER): Payer: Managed Care, Other (non HMO)

## 2015-11-27 VITALS — BP 132/80 | HR 75 | Temp 98.6°F | Resp 18 | Ht 64.0 in | Wt 142.0 lb

## 2015-11-27 DIAGNOSIS — M79672 Pain in left foot: Secondary | ICD-10-CM

## 2015-11-27 DIAGNOSIS — M25559 Pain in unspecified hip: Secondary | ICD-10-CM | POA: Insufficient documentation

## 2015-11-27 DIAGNOSIS — Z1151 Encounter for screening for human papillomavirus (HPV): Secondary | ICD-10-CM | POA: Insufficient documentation

## 2015-11-27 DIAGNOSIS — E7800 Pure hypercholesterolemia, unspecified: Secondary | ICD-10-CM

## 2015-11-27 DIAGNOSIS — Z658 Other specified problems related to psychosocial circumstances: Secondary | ICD-10-CM

## 2015-11-27 DIAGNOSIS — R888 Abnormal findings in other body fluids and substances: Secondary | ICD-10-CM | POA: Diagnosis not present

## 2015-11-27 DIAGNOSIS — F439 Reaction to severe stress, unspecified: Secondary | ICD-10-CM

## 2015-11-27 DIAGNOSIS — M25552 Pain in left hip: Secondary | ICD-10-CM | POA: Diagnosis not present

## 2015-11-27 DIAGNOSIS — M79671 Pain in right foot: Secondary | ICD-10-CM

## 2015-11-27 DIAGNOSIS — G479 Sleep disorder, unspecified: Secondary | ICD-10-CM

## 2015-11-27 DIAGNOSIS — I10 Essential (primary) hypertension: Secondary | ICD-10-CM | POA: Diagnosis not present

## 2015-11-27 DIAGNOSIS — Z01419 Encounter for gynecological examination (general) (routine) without abnormal findings: Secondary | ICD-10-CM | POA: Insufficient documentation

## 2015-11-27 DIAGNOSIS — R739 Hyperglycemia, unspecified: Secondary | ICD-10-CM

## 2015-11-27 DIAGNOSIS — IMO0002 Reserved for concepts with insufficient information to code with codable children: Secondary | ICD-10-CM

## 2015-11-27 DIAGNOSIS — B977 Papillomavirus as the cause of diseases classified elsewhere: Secondary | ICD-10-CM

## 2015-11-27 LAB — CALCIUM: Calcium: 9.3 mg/dL (ref 8.4–10.5)

## 2015-11-27 MED ORDER — ROSUVASTATIN CALCIUM 10 MG PO TABS: 10.0000 mg | ORAL_TABLET | Freq: Every day | ORAL | Status: DC

## 2015-11-27 MED ORDER — SERTRALINE HCL 50 MG PO TABS: 75.0000 mg | ORAL_TABLET | Freq: Every day | ORAL | Status: DC

## 2015-11-27 NOTE — Progress Notes (Signed)
Pre-visit discussion using our clinic review tool. No additional management support is needed unless otherwise documented below in the visit note.  

## 2015-11-27 NOTE — Progress Notes (Signed)
Order placed for f/u calcium.  

## 2015-11-27 NOTE — Progress Notes (Signed)
Patient ID: Rachel Collins, female   DOB: 1960/12/30, 54 y.o.   MRN: [id number]   Subjective:    Patient ID: Rachel Collins, female    DOB: 1961-07-03, 54 y.o.   MRN: [id number]  HPI  Patient with past history of hypercholesterolemia, hypertension and hyperglycemia.  She comes in today to follow up on these issues.   She stays active.  No cardiac symptoms with increased activity or exertion.  No sob.  Reports that over the last 6-8 months she has had left hip pain.  Increased pain.  Pain increased with lying on her left side.  She has also had some pain in her left foot.  Is s/p injection.  Was to wear a boot.  Only wore for one day.  No acid reflux.  No abdominal pain or cramping.  Bowels stable.     Past Medical History  Diagnosis Date  . Hypertension   . Hypercholesterolemia   . Hyperglycemia   . Gastritis   . Esophagitis   . History of migraine headaches   . Gastroparesis    Past Surgical History  Procedure Laterality Date  . Tubal ligation    . Cervical disc surgery      C5-C7 fusion  . Breast biopsy Left     benign   Family History  Problem Relation Age of Onset  . Heart disease Father     myocardial infarction x 5  . Prostate cancer Father   . Uterine cancer Mother   . COPD Mother   . Diabetes Mother   . Heart disease Mother     myocardial infarction  . Breast cancer Mother 8  . Heart disease Paternal Grandmother   . Heart disease Maternal Grandfather    Social History   Social History  . Marital Status: Married    Spouse Name: N/A  . Number of Children: 1  . Years of Education: N/A   Social History Main Topics  . Smoking status: Never Smoker   . Smokeless tobacco: Never Used  . Alcohol Use: 0.0 oz/week    0 Standard drinks or equivalent per week  . Drug Use: No  . Sexual Activity: Not Asked   Other Topics Concern  . None   Social History Narrative    Outpatient Encounter Prescriptions as of 11/27/2015  Medication Sig  . amLODipine (NORVASC) 5  MG tablet TAKE 1 TABLET (5 MG TOTAL) BY MOUTH DAILY.  Marland Kitchen azelastine (ASTELIN) 0.1 % nasal spray Place 1 spray into both nostrils 2 (two) times daily. Use in each nostril as directed  . hydrochlorothiazide (HYDRODIURIL) 25 MG tablet TAKE 1 TABLET BY MOUTH EVERY DAY  . losartan (COZAAR) 100 MG tablet TAKE 1 TABLET (100 MG TOTAL) BY MOUTH DAILY.  . rosuvastatin (CRESTOR) 10 MG tablet Take 1 tablet (10 mg total) by mouth daily.  . sertraline (ZOLOFT) 50 MG tablet Take 1.5 tablets (75 mg total) by mouth daily.  . traZODone (DESYREL) 50 MG tablet TAKE 1 TO 2 TABLETS EACH NIGHT AT BEDTIME  . [DISCONTINUED] meloxicam (MOBIC) 15 MG tablet Take 1 tablet (15 mg total) by mouth daily.  . [DISCONTINUED] rosuvastatin (CRESTOR) 10 MG tablet Take 1 tablet (10 mg total) by mouth daily.  . [DISCONTINUED] sertraline (ZOLOFT) 50 MG tablet TAKE 1 & 1/2 TABLETS BY MOUTH DAILY   No facility-administered encounter medications on file as of 11/27/2015.    Review of Systems  Constitutional: Negative for appetite change and unexpected weight change.  HENT: Negative  for congestion and sinus pressure.   Respiratory: Negative for cough, chest tightness and shortness of breath.   Cardiovascular: Negative for chest pain, palpitations and leg swelling.  Gastrointestinal: Negative for nausea, vomiting, abdominal pain and diarrhea.  Genitourinary: Negative for dysuria and difficulty urinating.  Musculoskeletal:       Left lateral hip pain as outlined.  Worse when lies on left side.  Left foot pain.    Skin: Negative for color change and rash.  Neurological: Negative for dizziness, light-headedness and headaches.  Psychiatric/Behavioral: Negative for dysphoric mood and agitation.       Handling stress well.         Objective:    Physical Exam  Constitutional: She appears well-developed and well-nourished. No distress.  HENT:  Nose: Nose normal.  Mouth/Throat: Oropharynx is clear and moist.  Eyes: Conjunctivae are  normal. Right eye exhibits no discharge. Left eye exhibits no discharge.  Neck: Neck supple. No thyromegaly present.  Cardiovascular: Normal rate and regular rhythm.   Pulmonary/Chest: Breath sounds normal. No respiratory distress. She has no wheezes.  Abdominal: Soft. Bowel sounds are normal. There is no tenderness.  Genitourinary:  Normal external genitalia.  Vaginal vault without lesions.  Cervix identified.  Pap smear performed.  Could not appreciate any adnexal masses or tenderness.    Musculoskeletal: She exhibits no edema or tenderness.  Increased pain to palpation over the left lateral hip.  No pain with abduction/adduction.    Lymphadenopathy:    She has no cervical adenopathy.  Skin: No rash noted. No erythema.  Psychiatric: She has a normal mood and affect. Her behavior is normal.    BP 132/80 mmHg  Pulse 75  Temp(Src) 98.6 F (37 C) (Oral)  Resp 18  Ht _0  (1.626 m)  Wt 142 lb (64.411 kg)  BMI 24.36 kg/m2  SpO2 98% Wt Readings from Last 3 Encounters:  11/27/15 142 lb (64.411 kg)  08/21/15 142 lb 2 oz (64.467 kg)  05/22/15 149 lb 4 oz (67.699 kg)     Lab Results  Component Value Date   WBC 8.4 08/16/2015   HGB 14.8 08/16/2015   HCT 44.1 08/16/2015   PLT 306.0 08/16/2015   GLUCOSE 112* 08/16/2015   CHOL 277* 08/16/2015   TRIG 168.0* 08/16/2015   HDL 69.20 08/16/2015   LDLDIRECT 190.4 08/30/2013   LDLCALC 174* 08/16/2015   ALT 24 11/26/2015   AST 22 11/26/2015   NA 138 08/16/2015   K 4.7 08/16/2015   CL 101 08/16/2015   CREATININE 0.75 08/16/2015   BUN 19 08/16/2015   CO2 28 08/16/2015   TSH 3.69 08/16/2015   HGBA1C 6.1 08/16/2015   MICROALBUR 1.1 03/09/2015    Mm Digital Screening Bilateral  05/29/2015  CLINICAL DATA:  Screening. EXAM: DIGITAL SCREENING BILATERAL MAMMOGRAM WITH CAD COMPARISON:  Previous exam(s). ACR Breast Density Category c: The breast tissue is heterogeneously dense, which may obscure small masses. FINDINGS: There are no findings  suspicious for malignancy. Images were processed with CAD. IMPRESSION: No mammographic evidence of malignancy. A result letter of this screening mammogram will be mailed directly to the patient. RECOMMENDATION: Screening mammogram in one year. (Code:SM-B-01Y) BI-RADS CATEGORY  1: Negative. Electronically Signed   By: Everlean Alstrom M.D.   On: 05/29/2015 16:09       Assessment & Plan:   Problem List Items Addressed This Visit    Difficulty sleeping    On trazodone.  Follow.       Heel pain, bilateral  Sees Dr Milinda Pointer.  S/p injection.       Hypercholesterolemia    Low cholesterol diet and exercise.  Follow lipid panel and liver function tests.  On crestor.        Relevant Medications   rosuvastatin (CRESTOR) 10 MG tablet   Other Relevant Orders   Lipid panel   Hepatic function panel   Hyperglycemia    Low carb diet and exercise.  Follow met b and a1c.        Relevant Orders   Hemoglobin A1c   Hypertension    Blood pressure under good control.  Continue same medication regimen.  Follow pressures.  Follow metabolic panel.        Relevant Medications   rosuvastatin (CRESTOR) 10 MG tablet   Other Relevant Orders   Basic metabolic panel   Left hip pain - Primary    Persistent pain as outlined.  Check xray.  Further w/up pending results.        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        Stress    On zoloft.  Doing well.  Follow.        Other Visit Diagnoses    HPV test positive        Relevant Orders    Cytology - PAP (Completed)        Einar Pheasant, MD

## 2015-11-28 ENCOUNTER — Ambulatory Visit
Admission: RE | Admit: 2015-11-28 | Discharge: 2015-11-28 | Disposition: A | Discharge status: Home / Self Care | Payer: Managed Care, Other (non HMO) | Source: Ambulatory Visit | Attending: Internal Medicine | Admitting: Internal Medicine

## 2015-11-28 ENCOUNTER — Other Ambulatory Visit: Payer: Self-pay | Admitting: Internal Medicine

## 2015-11-28 DIAGNOSIS — M25552 Pain in left hip: Secondary | ICD-10-CM | POA: Diagnosis present

## 2015-11-28 DIAGNOSIS — R9389 Abnormal findings on diagnostic imaging of other specified body structures: Secondary | ICD-10-CM

## 2015-11-28 NOTE — Progress Notes (Signed)
Order placed for bone scan.  

## 2015-11-29 LAB — CYTOLOGY - PAP

## 2015-11-30 ENCOUNTER — Encounter: Payer: Self-pay | Admitting: Internal Medicine

## 2015-12-02 ENCOUNTER — Encounter: Payer: Self-pay | Admitting: Internal Medicine

## 2015-12-02 DIAGNOSIS — R87810 Cervical high risk human papillomavirus (HPV) DNA test positive: Secondary | ICD-10-CM | POA: Insufficient documentation

## 2015-12-02 NOTE — Assessment & Plan Note (Signed)
Low carb diet and exercise.  Follow met b and a1c.   

## 2015-12-02 NOTE — Assessment & Plan Note (Signed)
Blood pressure under good control.  Continue same medication regimen.  Follow pressures.  Follow metabolic panel.   

## 2015-12-02 NOTE — Assessment & Plan Note (Signed)
On trazodone.  Follow.  

## 2015-12-02 NOTE — Assessment & Plan Note (Signed)
Persistent pain as outlined.  Check xray. Further w/up pending results.   

## 2015-12-02 NOTE — Assessment & Plan Note (Signed)
Low cholesterol diet and exercise.  Follow lipid panel and liver function tests.  On crestor.   

## 2015-12-02 NOTE — Assessment & Plan Note (Signed)
Follow up pap today.

## 2015-12-02 NOTE — Assessment & Plan Note (Signed)
On zoloft.  Doing well.  Follow.  

## 2015-12-02 NOTE — Assessment & Plan Note (Signed)
Sees Dr Al CorpusHyatt.  S/p injection.

## 2015-12-13 ENCOUNTER — Other Ambulatory Visit: Payer: Self-pay | Admitting: Internal Medicine

## 2015-12-17 NOTE — Telephone Encounter (Signed)
ok'd refill trazodone #60 with 2 refills.

## 2015-12-18 ENCOUNTER — Telehealth: Payer: Self-pay | Admitting: Internal Medicine

## 2015-12-18 NOTE — Telephone Encounter (Signed)
Information was given to the office.

## 2015-12-18 NOTE — Telephone Encounter (Signed)
Please advise 

## 2015-12-18 NOTE — Telephone Encounter (Signed)
What information is needed?  Is this something that you can give them?

## 2015-12-18 NOTE — Telephone Encounter (Signed)
Bonita QuinLinda from [facility] called ,[phone]. She needs information on pt that is coming in on Thursday for bone scan.   Pleas call.

## 2015-12-20 ENCOUNTER — Encounter
Admission: RE | Admit: 2015-12-20 | Discharge: 2015-12-20 | Disposition: A | Discharge status: Home / Self Care | Payer: Managed Care, Other (non HMO) | Source: Ambulatory Visit | Attending: Internal Medicine | Admitting: Internal Medicine

## 2015-12-20 ENCOUNTER — Encounter: Admission: RE | Admit: 2015-12-20 | Payer: Managed Care, Other (non HMO) | Source: Ambulatory Visit

## 2015-12-20 DIAGNOSIS — M25552 Pain in left hip: Secondary | ICD-10-CM | POA: Diagnosis not present

## 2015-12-20 DIAGNOSIS — R938 Abnormal findings on diagnostic imaging of other specified body structures: Secondary | ICD-10-CM | POA: Insufficient documentation

## 2015-12-20 DIAGNOSIS — R9389 Abnormal findings on diagnostic imaging of other specified body structures: Secondary | ICD-10-CM

## 2015-12-20 MED ORDER — TECHNETIUM TC 99M MEDRONATE IV KIT
25.0000 | PACK | Freq: Once | INTRAVENOUS | Status: AC | PRN
  Administered 2015-12-20: 23.59 via INTRAVENOUS

## 2015-12-24 ENCOUNTER — Other Ambulatory Visit: Payer: Self-pay | Admitting: Internal Medicine

## 2015-12-24 ENCOUNTER — Encounter: Payer: Self-pay | Admitting: Internal Medicine

## 2015-12-24 DIAGNOSIS — M889 Osteitis deformans of unspecified bone: Secondary | ICD-10-CM

## 2015-12-25 ENCOUNTER — Telehealth: Payer: Self-pay | Admitting: *Deleted

## 2015-12-25 NOTE — Telephone Encounter (Signed)
Pt notified of bone scan results and agreeable with endocrinology referral.  Order for referral placed.

## 2015-12-25 NOTE — Telephone Encounter (Signed)
Called pt and explained bone scan results to her.  She is agreeable to see endocrinology.  Order for referral placed.

## 2015-12-25 NOTE — Telephone Encounter (Signed)
Please advise 

## 2015-12-25 NOTE — Telephone Encounter (Signed)
Patent requested a call from Dr. Lorin PicketScott, patient stated that she missed a few calls from Dr. Lorin PicketScott. Pleaser advise

## 2016-01-15 ENCOUNTER — Other Ambulatory Visit: Payer: Self-pay

## 2016-01-15 MED ORDER — AMLODIPINE BESYLATE 5 MG PO TABS: ORAL_TABLET | ORAL | Status: DC

## 2016-01-15 MED ORDER — SERTRALINE HCL 50 MG PO TABS: 75.0000 mg | ORAL_TABLET | Freq: Every day | ORAL | Status: DC

## 2016-02-21 ENCOUNTER — Other Ambulatory Visit (INDEPENDENT_AMBULATORY_CARE_PROVIDER_SITE_OTHER): Payer: Managed Care, Other (non HMO)

## 2016-02-21 DIAGNOSIS — I10 Essential (primary) hypertension: Secondary | ICD-10-CM | POA: Diagnosis not present

## 2016-02-21 DIAGNOSIS — R739 Hyperglycemia, unspecified: Secondary | ICD-10-CM | POA: Diagnosis not present

## 2016-02-21 DIAGNOSIS — E7800 Pure hypercholesterolemia, unspecified: Secondary | ICD-10-CM

## 2016-02-21 LAB — LIPID PANEL
Cholesterol: 200 mg/dL (ref 0–200)
HDL: 65.7 mg/dL (ref >39.00)
LDL Cholesterol: 97 mg/dL (ref 0–99)
NONHDL: 134.26
Total CHOL/HDL Ratio: 3
Triglycerides: 185 mg/dL — ABNORMAL HIGH (ref 0.0–149.0)
VLDL: 37 mg/dL (ref 0.0–40.0)

## 2016-02-21 LAB — BASIC METABOLIC PANEL
BUN: 19 mg/dL (ref 6–23)
CALCIUM: 9.8 mg/dL (ref 8.4–10.5)
CHLORIDE: 102 meq/L (ref 96–112)
CO2: 28 mEq/L (ref 19–32)
CREATININE: 0.78 mg/dL (ref 0.40–1.20)
GFR: 81.12 mL/min (ref >60.00)
Glucose, Bld: 105 mg/dL — ABNORMAL HIGH (ref 70–99)
Potassium: 4.3 mEq/L (ref 3.5–5.1)
Sodium: 138 mEq/L (ref 135–145)

## 2016-02-21 LAB — HEPATIC FUNCTION PANEL
ALBUMIN: 5 g/dL (ref 3.5–5.2)
ALT: 28 U/L (ref 0–35)
AST: 25 U/L (ref 0–37)
Alkaline Phosphatase: 84 U/L (ref 39–117)
Bilirubin, Direct: 0.1 mg/dL (ref 0.0–0.3)
Total Bilirubin: 0.3 mg/dL (ref 0.2–1.2)
Total Protein: 7.4 g/dL (ref 6.0–8.3)

## 2016-02-21 LAB — HEMOGLOBIN A1C: HEMOGLOBIN A1C: 6.2 % (ref 4.6–6.5)

## 2016-02-22 ENCOUNTER — Encounter: Payer: Self-pay | Admitting: Internal Medicine

## 2016-02-26 ENCOUNTER — Ambulatory Visit: Payer: Managed Care, Other (non HMO) | Admitting: Internal Medicine

## 2016-02-27 NOTE — Telephone Encounter (Signed)
Unread mychart message mailed to patient 

## 2016-03-02 ENCOUNTER — Other Ambulatory Visit: Payer: Self-pay | Admitting: Internal Medicine

## 2016-04-08 ENCOUNTER — Other Ambulatory Visit: Payer: Self-pay | Admitting: Internal Medicine

## 2016-04-30 ENCOUNTER — Ambulatory Visit: Payer: Managed Care, Other (non HMO) | Admitting: Internal Medicine

## 2016-04-30 DIAGNOSIS — Z0289 Encounter for other administrative examinations: Secondary | ICD-10-CM

## 2016-05-16 ENCOUNTER — Encounter: Payer: Self-pay | Admitting: Internal Medicine

## 2016-05-16 DIAGNOSIS — M25561 Pain in right knee: Secondary | ICD-10-CM

## 2016-05-17 NOTE — Telephone Encounter (Signed)
Order placed for ortho referral.   

## 2016-05-19 ENCOUNTER — Encounter: Payer: Self-pay | Admitting: Internal Medicine

## 2016-05-19 DIAGNOSIS — M889 Osteitis deformans of unspecified bone: Secondary | ICD-10-CM | POA: Insufficient documentation

## 2016-05-31 ENCOUNTER — Other Ambulatory Visit: Payer: Self-pay | Admitting: Internal Medicine

## 2016-05-31 NOTE — Telephone Encounter (Signed)
Okay to refill or decline medication? Pt cancelled & no showed last two appt.

## 2016-06-01 NOTE — Telephone Encounter (Signed)
Has appt scheduled 08/21/16.  Ok to refill until appt, but please notify her needs to keep next appt.

## 2016-06-02 NOTE — Telephone Encounter (Signed)
Tried to call patient and voicemail has not yet been set up. Will have to call back later.

## 2016-06-03 ENCOUNTER — Encounter: Payer: Self-pay | Admitting: Internal Medicine

## 2016-06-04 NOTE — Telephone Encounter (Signed)
Please call pt and let her know that if she is having persistent symptoms, she needs to be seen.  Need to confirm no infection.  Let her know that I will not be in the office Wednesday 06/04/16.  I can see her on Friday 06/06/16 at 12:00.  If she is unable to wait until Friday (if symptoms are bad), then can schedule with another provider or acute care and then I can f/u.

## 2016-06-26 ENCOUNTER — Telehealth: Payer: Self-pay | Admitting: Internal Medicine

## 2016-06-26 ENCOUNTER — Ambulatory Visit: Payer: Managed Care, Other (non HMO) | Admitting: Family Medicine

## 2016-06-26 NOTE — Telephone Encounter (Signed)
FYI, Pt called not able to make appt her car broke down on the highway. Appt is still on the sch. Pt did make another appt for 07/10. Thank you!

## 2016-06-26 NOTE — Telephone Encounter (Signed)
Okay to cancel

## 2016-06-26 NOTE — Telephone Encounter (Signed)
Ok. Appt cancelled. Thank you! °

## 2016-06-30 ENCOUNTER — Ambulatory Visit: Payer: Managed Care, Other (non HMO) | Admitting: Family Medicine

## 2016-07-29 ENCOUNTER — Other Ambulatory Visit: Payer: Self-pay | Admitting: Internal Medicine

## 2016-08-06 ENCOUNTER — Encounter: Payer: Self-pay | Admitting: Internal Medicine

## 2016-08-06 DIAGNOSIS — I10 Essential (primary) hypertension: Secondary | ICD-10-CM

## 2016-08-06 DIAGNOSIS — E7800 Pure hypercholesterolemia, unspecified: Secondary | ICD-10-CM

## 2016-08-06 DIAGNOSIS — R739 Hyperglycemia, unspecified: Secondary | ICD-10-CM

## 2016-08-07 NOTE — Telephone Encounter (Signed)
I have placed the order for the labs.  Please schedule a lab appointment prior to her 08/21/16 appt.  Thanks

## 2016-08-18 ENCOUNTER — Other Ambulatory Visit (INDEPENDENT_AMBULATORY_CARE_PROVIDER_SITE_OTHER): Payer: Managed Care, Other (non HMO)

## 2016-08-18 DIAGNOSIS — R739 Hyperglycemia, unspecified: Secondary | ICD-10-CM

## 2016-08-18 DIAGNOSIS — E7800 Pure hypercholesterolemia, unspecified: Secondary | ICD-10-CM | POA: Diagnosis not present

## 2016-08-18 DIAGNOSIS — I10 Essential (primary) hypertension: Secondary | ICD-10-CM | POA: Diagnosis not present

## 2016-08-18 LAB — BASIC METABOLIC PANEL
BUN: 17 mg/dL (ref 6–23)
CALCIUM: 9 mg/dL (ref 8.4–10.5)
CHLORIDE: 106 meq/L (ref 96–112)
CO2: 25 meq/L (ref 19–32)
CREATININE: 0.6 mg/dL (ref 0.40–1.20)
GFR: 109.62 mL/min (ref >60.00)
Glucose, Bld: 106 mg/dL — ABNORMAL HIGH (ref 70–99)
Potassium: 4 mEq/L (ref 3.5–5.1)
Sodium: 139 mEq/L (ref 135–145)

## 2016-08-18 LAB — LIPID PANEL
Cholesterol: 222 mg/dL — ABNORMAL HIGH (ref 0–200)
HDL: 68.7 mg/dL (ref >39.00)
LDL Cholesterol: 129 mg/dL — ABNORMAL HIGH (ref 0–99)
NONHDL: 152.83
Total CHOL/HDL Ratio: 3
Triglycerides: 121 mg/dL (ref 0.0–149.0)
VLDL: 24.2 mg/dL (ref 0.0–40.0)

## 2016-08-18 LAB — CBC WITH DIFFERENTIAL/PLATELET
Basophils Absolute: 0 10*3/uL (ref 0.0–0.1)
Basophils Relative: 0.8 % (ref 0.0–3.0)
EOS PCT: 3.7 % (ref 0.0–5.0)
Eosinophils Absolute: 0.2 10*3/uL (ref 0.0–0.7)
HEMATOCRIT: 40 % (ref 36.0–46.0)
HEMOGLOBIN: 13.5 g/dL (ref 12.0–15.0)
LYMPHS PCT: 31.6 % (ref 12.0–46.0)
Lymphs Abs: 2 10*3/uL (ref 0.7–4.0)
MCHC: 33.8 g/dL (ref 30.0–36.0)
MCV: 90.8 fl (ref 78.0–100.0)
MONO ABS: 0.5 10*3/uL (ref 0.1–1.0)
MONOS PCT: 7.7 % (ref 3.0–12.0)
Neutro Abs: 3.5 10*3/uL (ref 1.4–7.7)
Neutrophils Relative %: 56.2 % (ref 43.0–77.0)
Platelets: 263 10*3/uL (ref 150.0–400.0)
RBC: 4.4 Mil/uL (ref 3.87–5.11)
RDW: 14.4 % (ref 11.5–15.5)
WBC: 6.2 10*3/uL (ref 4.0–10.5)

## 2016-08-18 LAB — HEPATIC FUNCTION PANEL
ALT: 27 U/L (ref 0–35)
AST: 20 U/L (ref 0–37)
Albumin: 4.7 g/dL (ref 3.5–5.2)
Alkaline Phosphatase: 44 U/L (ref 39–117)
Bilirubin, Direct: 0.1 mg/dL (ref 0.0–0.3)
Total Bilirubin: 0.4 mg/dL (ref 0.2–1.2)
Total Protein: 7.1 g/dL (ref 6.0–8.3)

## 2016-08-18 LAB — TSH: TSH: 1.32 u[IU]/mL (ref 0.35–4.50)

## 2016-08-18 LAB — HEMOGLOBIN A1C: Hgb A1c MFr Bld: 6.1 % (ref 4.6–6.5)

## 2016-08-20 ENCOUNTER — Encounter: Payer: Self-pay | Admitting: Internal Medicine

## 2016-08-21 ENCOUNTER — Encounter: Payer: Self-pay | Admitting: Internal Medicine

## 2016-08-21 ENCOUNTER — Ambulatory Visit (INDEPENDENT_AMBULATORY_CARE_PROVIDER_SITE_OTHER): Payer: Managed Care, Other (non HMO) | Admitting: Internal Medicine

## 2016-08-21 VITALS — BP 124/82 | HR 84 | Temp 98.2°F | Resp 16 | Wt 136.0 lb

## 2016-08-21 DIAGNOSIS — F439 Reaction to severe stress, unspecified: Secondary | ICD-10-CM

## 2016-08-21 DIAGNOSIS — E559 Vitamin D deficiency, unspecified: Secondary | ICD-10-CM

## 2016-08-21 DIAGNOSIS — R5383 Other fatigue: Secondary | ICD-10-CM

## 2016-08-21 DIAGNOSIS — I10 Essential (primary) hypertension: Secondary | ICD-10-CM

## 2016-08-21 DIAGNOSIS — Z1159 Encounter for screening for other viral diseases: Secondary | ICD-10-CM

## 2016-08-21 DIAGNOSIS — Z23 Encounter for immunization: Secondary | ICD-10-CM | POA: Diagnosis not present

## 2016-08-21 DIAGNOSIS — R739 Hyperglycemia, unspecified: Secondary | ICD-10-CM

## 2016-08-21 DIAGNOSIS — Z658 Other specified problems related to psychosocial circumstances: Secondary | ICD-10-CM

## 2016-08-21 DIAGNOSIS — E7800 Pure hypercholesterolemia, unspecified: Secondary | ICD-10-CM

## 2016-08-21 DIAGNOSIS — M889 Osteitis deformans of unspecified bone: Secondary | ICD-10-CM | POA: Diagnosis not present

## 2016-08-21 DIAGNOSIS — G479 Sleep disorder, unspecified: Secondary | ICD-10-CM

## 2016-08-21 NOTE — Progress Notes (Signed)
Patient ID: Rachel Collins, female   DOB: 08-Feb-1961, 55 y.o.   MRN: [id number]   Subjective:    Patient ID: Rachel Collins, female    DOB: Mar 09, 1961, 55 y.o.   MRN: [id number]  HPI  Patient here for a scheduled follow up.  Increased stress. Going through a separation.  Trying to finalize things.  Not sleeping well.  Taking trazodone.  Sleeps, but then wakes up around 4-5:00.  Overall she feels she is handling things relatively well.  Desires not to have any further intervention or changes in her medication at this time.  Some fatigue.  No chest pain.  No sob.  No acid reflux.  No abdominal pain or cramping.  Bowels stable.  Diagnosed with Paget's disease.  Received reclast.  Did not help pain.  Using miacalcin now.  Helping.     Past Medical History:  Diagnosis Date  . Esophagitis   . Gastritis   . Gastroparesis   . History of migraine headaches   . Hypercholesterolemia   . Hyperglycemia   . Hypertension    Past Surgical History:  Procedure Laterality Date  . BREAST BIOPSY Left    benign  . CERVICAL DISC SURGERY     C5-C7 fusion  . TUBAL LIGATION     Family History  Problem Relation Age of Onset  . Heart disease Father     myocardial infarction x 5  . Prostate cancer Father   . Uterine cancer Mother   . COPD Mother   . Diabetes Mother   . Heart disease Mother     myocardial infarction  . Breast cancer Mother 77  . Heart disease Paternal Grandmother   . Heart disease Maternal Grandfather    Social History   Social History  . Marital status: Married    Spouse name: N/A  . Number of children: 1  . Years of education: N/A   Social History Main Topics  . Smoking status: Never Smoker  . Smokeless tobacco: Never Used  . Alcohol use 0.0 oz/week     Comment: wine  . Drug use: No  . Sexual activity: Not Asked   Other Topics Concern  . None   Social History Narrative  . None    Outpatient Encounter Prescriptions as of 08/21/2016  Medication Sig  . amLODipine  (NORVASC) 5 MG tablet TAKE 1 TABLET (5 MG TOTAL) BY MOUTH DAILY.  Marland Kitchen azelastine (ASTELIN) 0.1 % nasal spray Place 1 spray into both nostrils 2 (two) times daily. Use in each nostril as directed  . hydrochlorothiazide (HYDRODIURIL) 25 MG tablet TAKE 1 TABLET BY MOUTH EVERY DAY  . losartan (COZAAR) 100 MG tablet TAKE 1 TABLET (100 MG TOTAL) BY MOUTH DAILY.  . rosuvastatin (CRESTOR) 10 MG tablet Take 1 tablet (10 mg total) by mouth daily.  . sertraline (ZOLOFT) 50 MG tablet Take 1.5 tablets (75 mg total) by mouth daily.  . traZODone (DESYREL) 50 MG tablet Take 1-2 tablets (50-100 mg total) by mouth at bedtime. Must keep appt in August for any further refills  . [DISCONTINUED] pravastatin (PRAVACHOL) 40 MG tablet TAKE 1 TABLET BY MOUTH DAILY  . [DISCONTINUED] rosuvastatin (CRESTOR) 10 MG tablet TAKE 1 TABLET (10 MG TOTAL) BY MOUTH DAILY.   No facility-administered encounter medications on file as of 08/21/2016.     Review of Systems  Constitutional: Negative for appetite change and unexpected weight change.  HENT: Negative for congestion and sinus pressure.   Respiratory: Negative for cough, chest tightness  and shortness of breath.   Cardiovascular: Negative for chest pain, palpitations and leg swelling.  Gastrointestinal: Negative for abdominal pain, diarrhea, nausea and vomiting.  Genitourinary: Negative for difficulty urinating and dysuria.  Musculoskeletal: Negative for back pain and joint swelling.  Skin: Negative for color change and rash.  Neurological: Negative for dizziness, light-headedness and headaches.  Psychiatric/Behavioral: Positive for sleep disturbance. Negative for agitation and dysphoric mood.       Increased stress as outlined.         Objective:    Physical Exam  Constitutional: She appears well-developed and well-nourished. No distress.  HENT:  Nose: Nose normal.  Mouth/Throat: Oropharynx is clear and moist.  Neck: Neck supple. No thyromegaly present.    Cardiovascular: Normal rate and regular rhythm.   Pulmonary/Chest: Breath sounds normal. No respiratory distress. She has no wheezes.  Abdominal: Soft. Bowel sounds are normal. There is no tenderness.  Musculoskeletal: She exhibits no edema or tenderness.  Lymphadenopathy:    She has no cervical adenopathy.  Skin: No rash noted. No erythema.  Psychiatric: She has a normal mood and affect. Her behavior is normal.    BP 124/82 (BP Location: Right Arm, Patient Position: Sitting, Cuff Size: Normal)   Pulse 84   Temp 98.2 F (36.8 C) (Oral)   Resp 16   Wt 136 lb (61.7 kg)   BMI 23.34 kg/m  Wt Readings from Last 3 Encounters:  08/21/16 136 lb (61.7 kg)  11/27/15 142 lb (64.4 kg)  08/21/15 142 lb 2 oz (64.5 kg)     Lab Results  Component Value Date   WBC 6.2 08/18/2016   HGB 13.5 08/18/2016   HCT 40.0 08/18/2016   PLT 263.0 08/18/2016   GLUCOSE 106 (H) 08/18/2016   CHOL 222 (H) 08/18/2016   TRIG 121.0 08/18/2016   HDL 68.70 08/18/2016   LDLDIRECT 190.4 08/30/2013   LDLCALC 129 (H) 08/18/2016   ALT 27 08/18/2016   AST 20 08/18/2016   NA 139 08/18/2016   K 4.0 08/18/2016   CL 106 08/18/2016   CREATININE 0.60 08/18/2016   BUN 17 08/18/2016   CO2 25 08/18/2016   TSH 1.32 08/18/2016   HGBA1C 6.1 08/18/2016   MICROALBUR 1.1 03/09/2015    Nm Bone Scan Whole Body  Result Date: 12/20/2015 CLINICAL DATA:  55 year old with chronic approximate 9 month history of low back pain radiating into the left hip and left lower extremity with a painful left hip when lying on her left side. Recent abnormal x-ray suggesting Paget's disease of the left hemipelvis. Surgical history includes ACDF with hardware C5 through C7 EXAM: NUCLEAR MEDICINE WHOLE BODY BONE SCAN TECHNIQUE: Whole body anterior and posterior images were obtained approximately 3 hours after intravenous injection of radiopharmaceutical. RADIOPHARMACEUTICALS:  23.6 mCi Technetium-88mMDP IV COMPARISON:  No prior nuclear imaging.  Left hip x-rays including AP pelvis 11/28/2015. Left foot and ankle x-rays 06/04/2015. Surgical history includes ACDF with hardware C5 through C7. FINDINGS: Moderate-to-marked increased activity involving the left iliac bone, especially in its posteromedial portion and peripherally. Moderate-to-marked increased activity involving the left acetabulum, left ischium, and left superior pubic ramus. Mild increased activity involving the lateral right iliac bone. Mild symmetric activity involving the acromioclavicular joints bilaterally. Mild activity in the anterior lower cervical spine. Moderate activity involving the posterior left calcaneus. Mild activity involving the right 1st MTP joint. IMPRESSION: 1. Moderate to marked increased activity involving the left iliac bone, left acetabulum, left ischium and left superior pubic ramus, indicating Paget's  disease as suspected on the pelvic and left hip x-rays 11/28/2015. 2. Mild increased activity involving the lateral right iliac bone at the site of enthesopathic spurs on the prior x-ray; if the patient has focal pain, then an acute enthesopathy is likely. 3. Moderate increased activity involving the posterior left calcaneus at the site of an enthesopathic spur at the insertion of the Achilles tendon on the prior x-ray; again, if the patient has focal pain, then an acute enthesopathy is likely. 4. Mild degenerative activity involving the acromioclavicular joints bilaterally and the 1st MTP joint in the right foot. Electronically Signed   By: Evangeline Dakin M.D.   On: 12/20/2015 12:25       Assessment & Plan:   Problem List Items Addressed This Visit    Difficulty sleeping    On trazodone.  Falls asleep.  Wakes up early.  Desires no further changes or intervention at this time.  Wants to monitor.        Hypercholesterolemia    On crestor.  Low cholesterol diet and exercise.  Follow lipid panel and liver function tests.        Relevant Orders   Lipid panel    Hepatic function panel   Hyperglycemia    Low carb diet and exercise.  Follow met b and a1c.        Relevant Orders   Hemoglobin A1c   Hypertension    Blood pressure under good control.  Continue same medication regimen.  Follow pressures.  Follow metabolic panel.        Relevant Orders   Basic metabolic panel   Paget disease of bone    Saw Dr Gabriel Carina.  She is taking calcium and vitamin D.  Now on miacalcin.        Stress    Discussed with her today.  Discussed changing her medications.  She wants to hold at this time.  Wants to monitor for now.  Follow.        Vitamin D deficiency    Continue vitamin D supplements.         Other Visit Diagnoses    Other fatigue    -  Primary   Relevant Orders   Vitamin B12   Encounter for immunization       Relevant Orders   Flu Vaccine QUAD 36+ mos IM (Completed)   Lipid panel   Hepatic function panel   Hemoglobin B7C   Basic metabolic panel   Need for hepatitis C screening test       Relevant Orders   Hepatitis C antibody       Einar Pheasant, MD

## 2016-08-22 ENCOUNTER — Other Ambulatory Visit: Payer: Self-pay | Admitting: Internal Medicine

## 2016-08-22 DIAGNOSIS — Z1231 Encounter for screening mammogram for malignant neoplasm of breast: Secondary | ICD-10-CM

## 2016-08-24 ENCOUNTER — Encounter: Payer: Self-pay | Admitting: Internal Medicine

## 2016-08-24 DIAGNOSIS — E559 Vitamin D deficiency, unspecified: Secondary | ICD-10-CM | POA: Insufficient documentation

## 2016-08-24 NOTE — Assessment & Plan Note (Signed)
On trazodone.  Falls asleep.  Wakes up early.  Desires no further changes or intervention at this time.  Wants to monitor.

## 2016-08-24 NOTE — Assessment & Plan Note (Signed)
On crestor.  Low cholesterol diet and exercise.  Follow lipid panel and liver function tests.   

## 2016-08-24 NOTE — Assessment & Plan Note (Signed)
Discussed with her today.  Discussed changing her medications.  She wants to hold at this time.  Wants to monitor for now.  Follow.

## 2016-08-24 NOTE — Assessment & Plan Note (Signed)
Blood pressure under good control.  Continue same medication regimen.  Follow pressures.  Follow metabolic panel.   

## 2016-08-24 NOTE — Assessment & Plan Note (Signed)
Continue vitamin D supplements.  

## 2016-08-24 NOTE — Assessment & Plan Note (Signed)
Low carb diet and exercise.  Follow met b and a1c.   

## 2016-08-24 NOTE — Assessment & Plan Note (Signed)
Saw Dr Tedd SiasSolum.  She is taking calcium and vitamin D.  Now on miacalcin.

## 2016-08-25 ENCOUNTER — Other Ambulatory Visit: Payer: Self-pay | Admitting: Internal Medicine

## 2016-08-26 NOTE — Telephone Encounter (Signed)
ok'd rx for trazodone #60 with 1 refill.

## 2016-08-26 NOTE — Telephone Encounter (Signed)
Last filled on 06/04/16 # 60 +2. Last OV 8/31. Ok to refill?

## 2016-09-09 ENCOUNTER — Ambulatory Visit: Payer: Managed Care, Other (non HMO)

## 2016-09-30 ENCOUNTER — Ambulatory Visit
Admission: RE | Admit: 2016-09-30 | Discharge: 2016-09-30 | Disposition: A | Discharge status: Home / Self Care | Payer: Managed Care, Other (non HMO) | Source: Ambulatory Visit | Attending: Internal Medicine | Admitting: Internal Medicine

## 2016-09-30 DIAGNOSIS — Z1231 Encounter for screening mammogram for malignant neoplasm of breast: Secondary | ICD-10-CM | POA: Insufficient documentation

## 2016-10-03 ENCOUNTER — Other Ambulatory Visit: Payer: Self-pay

## 2016-10-03 MED ORDER — TRAZODONE HCL 50 MG PO TABS: 50.0000 mg | ORAL_TABLET | Freq: Every day | ORAL | 0 refills | Status: DC

## 2016-10-03 MED ORDER — HYDROCHLOROTHIAZIDE 25 MG PO TABS: 25.0000 mg | ORAL_TABLET | Freq: Every day | ORAL | 1 refills | Status: DC

## 2016-10-03 MED ORDER — LOSARTAN POTASSIUM 100 MG PO TABS: 100.0000 mg | ORAL_TABLET | Freq: Every day | ORAL | 1 refills | Status: DC

## 2016-10-03 NOTE — Progress Notes (Signed)
Patient would like 90 days please advise  Addendum.  rx ok'd for losartan #90 with one refill, trazodone #180 with no refills and hctz #90 with one refill.    Dr Lorin PicketScott

## 2016-12-23 ENCOUNTER — Other Ambulatory Visit: Payer: Managed Care, Other (non HMO)

## 2016-12-24 ENCOUNTER — Encounter: Payer: Managed Care, Other (non HMO) | Admitting: Internal Medicine

## 2017-02-06 ENCOUNTER — Other Ambulatory Visit: Payer: Self-pay | Admitting: Internal Medicine

## 2017-03-21 ENCOUNTER — Other Ambulatory Visit: Payer: Self-pay | Admitting: Internal Medicine

## 2017-03-23 NOTE — Telephone Encounter (Signed)
Please advise for refills? Has upcoming appt on the 6th, thanks

## 2017-03-27 ENCOUNTER — Encounter: Payer: Managed Care, Other (non HMO) | Admitting: Internal Medicine

## 2017-04-04 ENCOUNTER — Other Ambulatory Visit: Payer: Self-pay | Admitting: Internal Medicine

## 2017-05-11 ENCOUNTER — Other Ambulatory Visit: Payer: Self-pay | Admitting: Internal Medicine

## 2017-06-29 ENCOUNTER — Other Ambulatory Visit (INDEPENDENT_AMBULATORY_CARE_PROVIDER_SITE_OTHER): Payer: Managed Care, Other (non HMO)

## 2017-06-29 DIAGNOSIS — I10 Essential (primary) hypertension: Secondary | ICD-10-CM

## 2017-06-29 DIAGNOSIS — R5383 Other fatigue: Secondary | ICD-10-CM | POA: Diagnosis not present

## 2017-06-29 DIAGNOSIS — E7800 Pure hypercholesterolemia, unspecified: Secondary | ICD-10-CM | POA: Diagnosis not present

## 2017-06-29 DIAGNOSIS — Z23 Encounter for immunization: Secondary | ICD-10-CM | POA: Diagnosis not present

## 2017-06-29 DIAGNOSIS — R739 Hyperglycemia, unspecified: Secondary | ICD-10-CM | POA: Diagnosis not present

## 2017-06-29 DIAGNOSIS — Z1159 Encounter for screening for other viral diseases: Secondary | ICD-10-CM

## 2017-06-29 LAB — LIPID PANEL
CHOLESTEROL: 195 mg/dL (ref 0–200)
HDL: 65.5 mg/dL (ref >39.00)
LDL Cholesterol: 106 mg/dL — ABNORMAL HIGH (ref 0–99)
NonHDL: 129.81
TRIGLYCERIDES: 119 mg/dL (ref 0.0–149.0)
Total CHOL/HDL Ratio: 3
VLDL: 23.8 mg/dL (ref 0.0–40.0)

## 2017-06-29 LAB — HEPATIC FUNCTION PANEL
ALK PHOS: 51 U/L (ref 39–117)
ALT: 28 U/L (ref 0–35)
AST: 20 U/L (ref 0–37)
Albumin: 4.7 g/dL (ref 3.5–5.2)
BILIRUBIN DIRECT: 0.1 mg/dL (ref 0.0–0.3)
TOTAL PROTEIN: 7.1 g/dL (ref 6.0–8.3)
Total Bilirubin: 0.4 mg/dL (ref 0.2–1.2)

## 2017-06-29 LAB — BASIC METABOLIC PANEL
BUN: 16 mg/dL (ref 6–23)
CO2: 25 meq/L (ref 19–32)
Calcium: 9.9 mg/dL (ref 8.4–10.5)
Chloride: 106 mEq/L (ref 96–112)
Creatinine, Ser: 0.68 mg/dL (ref 0.40–1.20)
GFR: 94.58 mL/min (ref >60.00)
GLUCOSE: 121 mg/dL — AB (ref 70–99)
POTASSIUM: 3.8 meq/L (ref 3.5–5.1)
Sodium: 140 mEq/L (ref 135–145)

## 2017-06-29 LAB — VITAMIN B12: Vitamin B-12: 301 pg/mL (ref 211–911)

## 2017-06-29 LAB — HEMOGLOBIN A1C: Hgb A1c MFr Bld: 6.2 % (ref 4.6–6.5)

## 2017-06-30 ENCOUNTER — Encounter: Payer: Self-pay | Admitting: Internal Medicine

## 2017-06-30 LAB — HEPATITIS C ANTIBODY: HCV Ab: NEGATIVE

## 2017-07-02 ENCOUNTER — Ambulatory Visit (INDEPENDENT_AMBULATORY_CARE_PROVIDER_SITE_OTHER): Payer: Managed Care, Other (non HMO) | Admitting: Internal Medicine

## 2017-07-02 ENCOUNTER — Encounter: Payer: Self-pay | Admitting: Internal Medicine

## 2017-07-02 VITALS — BP 124/82 | HR 64 | Temp 98.4°F | Resp 12 | Ht 65.0 in | Wt 143.4 lb

## 2017-07-02 DIAGNOSIS — Z0000 Encounter for general adult medical examination without abnormal findings: Secondary | ICD-10-CM

## 2017-07-02 DIAGNOSIS — E559 Vitamin D deficiency, unspecified: Secondary | ICD-10-CM

## 2017-07-02 DIAGNOSIS — I10 Essential (primary) hypertension: Secondary | ICD-10-CM

## 2017-07-02 DIAGNOSIS — E7800 Pure hypercholesterolemia, unspecified: Secondary | ICD-10-CM | POA: Diagnosis not present

## 2017-07-02 DIAGNOSIS — R739 Hyperglycemia, unspecified: Secondary | ICD-10-CM

## 2017-07-02 DIAGNOSIS — M25551 Pain in right hip: Secondary | ICD-10-CM | POA: Diagnosis not present

## 2017-07-02 DIAGNOSIS — M545 Low back pain, unspecified: Secondary | ICD-10-CM

## 2017-07-02 DIAGNOSIS — Z1211 Encounter for screening for malignant neoplasm of colon: Secondary | ICD-10-CM | POA: Diagnosis not present

## 2017-07-02 DIAGNOSIS — F439 Reaction to severe stress, unspecified: Secondary | ICD-10-CM | POA: Diagnosis not present

## 2017-07-02 DIAGNOSIS — M889 Osteitis deformans of unspecified bone: Secondary | ICD-10-CM

## 2017-07-02 DIAGNOSIS — M25552 Pain in left hip: Secondary | ICD-10-CM

## 2017-07-02 MED ORDER — TRAZODONE HCL 50 MG PO TABS: 50.0000 mg | ORAL_TABLET | Freq: Every day | ORAL | 0 refills | Status: DC

## 2017-07-02 MED ORDER — ROSUVASTATIN CALCIUM 10 MG PO TABS: 10.0000 mg | ORAL_TABLET | Freq: Every day | ORAL | 3 refills | Status: DC

## 2017-07-02 MED ORDER — SERTRALINE HCL 50 MG PO TABS: 75.0000 mg | ORAL_TABLET | Freq: Every day | ORAL | 1 refills | Status: DC

## 2017-07-02 MED ORDER — HYDROCHLOROTHIAZIDE 25 MG PO TABS: 25.0000 mg | ORAL_TABLET | Freq: Every day | ORAL | 1 refills | Status: DC

## 2017-07-02 MED ORDER — LOSARTAN POTASSIUM 100 MG PO TABS: 100.0000 mg | ORAL_TABLET | Freq: Every day | ORAL | 1 refills | Status: DC

## 2017-07-02 NOTE — Assessment & Plan Note (Addendum)
Physical today 07/02/17.  PAP 05/22/15.  MAMMOGRAM 09/30/16 - Birads I.  Colonoscopy 12/09/04 - normal.  Overdue colonoscopy.  Refer to GI.

## 2017-07-02 NOTE — Progress Notes (Signed)
Patient ID: Rachel Collins, female   DOB: 07-13-1961, 56 y.o.   MRN: [id number]   Subjective:    Patient ID: Rachel Collins, female    DOB: August 13, 1961, 56 y.o.   MRN: [id number]  HPI  Patient here for her physical exam.  She reports she is doing relatively well.  Working.  Some increased stress with her work.  She feels she is doing relatively well.  Tries to stay active.  No chest pain.  No sob.  No acid reflux.  No abdominal pain.  Bowels moving.  She is still having low back pain and left hip pain.  Actually has some bilateral hip pain - left appears to be worse.  Saw ortho.  States unable to sleep on left side.  Was questioning if could see a chiropractor.     Past Medical History:  Diagnosis Date  . Esophagitis   . Gastritis   . Gastroparesis   . History of migraine headaches   . Hypercholesterolemia   . Hyperglycemia   . Hypertension    Past Surgical History:  Procedure Laterality Date  . BREAST BIOPSY Left 2010   benign  . CERVICAL DISC SURGERY     C5-C7 fusion  . TUBAL LIGATION     Family History  Problem Relation Age of Onset  . Heart disease Father        myocardial infarction x 5  . Prostate cancer Father   . Uterine cancer Mother   . COPD Mother   . Diabetes Mother   . Heart disease Mother        myocardial infarction  . Breast cancer Mother 18  . Heart disease Paternal Grandmother   . Heart disease Maternal Grandfather    Social History   Social History  . Marital status: Married    Spouse name: N/A  . Number of children: 1  . Years of education: N/A   Social History Main Topics  . Smoking status: Never Smoker  . Smokeless tobacco: Never Used  . Alcohol use 0.0 oz/week     Comment: wine  . Drug use: No  . Sexual activity: Not Asked   Other Topics Concern  . None   Social History Narrative  . None    Outpatient Encounter Prescriptions as of 07/02/2017  Medication Sig  . amLODipine (NORVASC) 5 MG tablet TAKE 1 TABLET (5 MG TOTAL) BY MOUTH  DAILY.  . hydrochlorothiazide (HYDRODIURIL) 25 MG tablet Take 1 tablet (25 mg total) by mouth daily.  Marland Kitchen losartan (COZAAR) 100 MG tablet Take 1 tablet (100 mg total) by mouth daily.  . rosuvastatin (CRESTOR) 10 MG tablet Take 1 tablet (10 mg total) by mouth daily.  . sertraline (ZOLOFT) 50 MG tablet Take 1.5 tablets (75 mg total) by mouth daily.  . traZODone (DESYREL) 50 MG tablet Take 1-2 tablets (50-100 mg total) by mouth at bedtime.  . [DISCONTINUED] hydrochlorothiazide (HYDRODIURIL) 25 MG tablet Take 1 tablet (25 mg total) by mouth daily.  . [DISCONTINUED] losartan (COZAAR) 100 MG tablet Take 1 tablet (100 mg total) by mouth daily.  . [DISCONTINUED] rosuvastatin (CRESTOR) 10 MG tablet Take 1 tablet (10 mg total) by mouth daily.  . [DISCONTINUED] sertraline (ZOLOFT) 50 MG tablet TAKE 1.5 TABLETS (75 MG TOTAL) BY MOUTH DAILY.  . [DISCONTINUED] traZODone (DESYREL) 50 MG tablet TAKE 1-2 TABLETS (50-100 MG TOTAL) BY MOUTH AT BEDTIME.  Marland Kitchen azelastine (ASTELIN) 0.1 % nasal spray Place 1 spray into both nostrils 2 (two) times daily.  Use in each nostril as directed (Patient not taking: Reported on 07/02/2017)  . [DISCONTINUED] amLODipine (NORVASC) 5 MG tablet TAKE 1 TABLET (5 MG TOTAL) BY MOUTH DAILY.  . [DISCONTINUED] rosuvastatin (CRESTOR) 10 MG tablet TAKE 1 TABLET (10 MG TOTAL) BY MOUTH DAILY.   No facility-administered encounter medications on file as of 07/02/2017.     Review of Systems  Constitutional: Negative for appetite change and unexpected weight change.  HENT: Negative for congestion and sinus pressure.   Eyes: Negative for itching and visual disturbance.  Respiratory: Negative for cough, chest tightness and shortness of breath.   Cardiovascular: Negative for chest pain, palpitations and leg swelling.  Gastrointestinal: Negative for abdominal pain, diarrhea, nausea and vomiting.  Genitourinary: Negative for difficulty urinating and dysuria.  Musculoskeletal: Positive for back pain.  Negative for joint swelling.       Back and hip pain as outlined.    Skin: Negative for color change and rash.  Neurological: Negative for dizziness, light-headedness and headaches.  Psychiatric/Behavioral: Negative for agitation and dysphoric mood.       Objective:    Physical Exam  Constitutional: She is oriented to person, place, and time. She appears well-developed and well-nourished. No distress.  HENT:  Nose: Nose normal.  Mouth/Throat: Oropharynx is clear and moist.  Eyes: Right eye exhibits no discharge. Left eye exhibits no discharge. No scleral icterus.  Neck: Neck supple. No thyromegaly present.  Cardiovascular: Normal rate and regular rhythm.   Pulmonary/Chest: Breath sounds normal. No accessory muscle usage. No tachypnea. No respiratory distress. She has no decreased breath sounds. She has no wheezes. She has no rhonchi. Right breast exhibits no inverted nipple, no mass, no nipple discharge and no tenderness (no axillary adenopathy). Left breast exhibits no inverted nipple, no mass, no nipple discharge and no tenderness (no axilarry adenopathy).  Abdominal: Soft. Bowel sounds are normal. There is no tenderness.  Musculoskeletal: She exhibits no edema or tenderness.  Lymphadenopathy:    She has no cervical adenopathy.  Neurological: She is alert and oriented to person, place, and time.  Skin: Skin is warm. No rash noted. No erythema.  Psychiatric: She has a normal mood and affect. Her behavior is normal.    BP 124/82 (BP Location: Left Arm, Patient Position: Sitting, Cuff Size: Normal)   Pulse 64   Temp 98.4 F (36.9 C) (Oral)   Resp 12   Ht '5\' 5"'$  (1.651 m)   Wt 143 lb 6.4 oz (65 kg)   SpO2 98%   BMI 23.86 kg/m  Wt Readings from Last 3 Encounters:  07/02/17 143 lb 6.4 oz (65 kg)  08/21/16 136 lb (61.7 kg)  11/27/15 142 lb (64.4 kg)     Lab Results  Component Value Date   WBC 6.2 08/18/2016   HGB 13.5 08/18/2016   HCT 40.0 08/18/2016   PLT 263.0 08/18/2016    GLUCOSE 121 (H) 06/29/2017   CHOL 195 06/29/2017   TRIG 119.0 06/29/2017   HDL 65.50 06/29/2017   LDLDIRECT 190.4 08/30/2013   LDLCALC 106 (H) 06/29/2017   ALT 28 06/29/2017   AST 20 06/29/2017   NA 140 06/29/2017   K 3.8 06/29/2017   CL 106 06/29/2017   CREATININE 0.68 06/29/2017   BUN 16 06/29/2017   CO2 25 06/29/2017   TSH 1.32 08/18/2016   HGBA1C 6.2 06/29/2017   MICROALBUR 1.1 03/09/2015    Mm Digital Screening Bilateral  Result Date: 09/30/2016 CLINICAL DATA:  Screening. EXAM: DIGITAL SCREENING BILATERAL MAMMOGRAM WITH  CAD COMPARISON:  Previous exam(s). ACR Breast Density Category c: The breast tissue is heterogeneously dense, which may obscure small masses. FINDINGS: There are no findings suspicious for malignancy. Images were processed with CAD. IMPRESSION: No mammographic evidence of malignancy. A result letter of this screening mammogram will be mailed directly to the patient. RECOMMENDATION: Screening mammogram in one year. (Code:SM-B-01Y) BI-RADS CATEGORY  1: Negative. Electronically Signed   By: Ammie Ferrier M.D.   On: 09/30/2016 10:47       Assessment & Plan:   Problem List Items Addressed This Visit    Health care maintenance    Physical today 07/02/17.  PAP 05/22/15.  MAMMOGRAM 09/30/16 - Birads I.  Colonoscopy 12/09/04 - normal.  Overdue colonoscopy.  Refer to GI.       Hypercholesterolemia    On crestor.  Low cholesterol diet and exercise.  Follow lipid panel and liver function tests.   Lab Results  Component Value Date   CHOL 195 06/29/2017   HDL 65.50 06/29/2017   LDLCALC 106 (H) 06/29/2017   LDLDIRECT 190.4 08/30/2013   TRIG 119.0 06/29/2017   CHOLHDL 3 06/29/2017        Relevant Medications   hydrochlorothiazide (HYDRODIURIL) 25 MG tablet   losartan (COZAAR) 100 MG tablet   rosuvastatin (CRESTOR) 10 MG tablet   Hyperglycemia    Low carb diet and exercise.  Follow met b and a1c.       Hypertension    Blood pressure under good control.   Continue same medication regimen.  Follow pressures.  Follow metabolic panel.        Relevant Medications   hydrochlorothiazide (HYDRODIURIL) 25 MG tablet   losartan (COZAAR) 100 MG tablet   rosuvastatin (CRESTOR) 10 MG tablet   Left hip pain    Persistent back and hip pain.  Will check xray.       Paget disease of bone    Saw Dr Gabriel Carina.  Taking calcium and vitamin D.  Due f/u.  Will arrange.        Relevant Orders   Ambulatory referral to Endocrinology   Stress    On zoloft and trazodone.  Doing well on these medications.  Does not feel she needs any further intervention.  Follow.        Vitamin D deficiency    Continue vitamin D supplements.         Other Visit Diagnoses    Bilateral low back pain without sciatica, unspecified chronicity    -  Primary   Relevant Orders   DG Lumbar Spine 2-3 Views (Completed)   DG HIPS BILAT WITH PELVIS 3-4 VIEWS (Completed)   Pain of both hip joints       Colon cancer screening       Relevant Orders   Ambulatory referral to Gastroenterology       Einar Pheasant, MD

## 2017-07-02 NOTE — Progress Notes (Signed)
Pre-visit discussion using our clinic review tool. No additional management support is needed unless otherwise documented below in the visit note.  

## 2017-07-03 ENCOUNTER — Ambulatory Visit (INDEPENDENT_AMBULATORY_CARE_PROVIDER_SITE_OTHER): Payer: Managed Care, Other (non HMO)

## 2017-07-03 DIAGNOSIS — M545 Low back pain, unspecified: Secondary | ICD-10-CM

## 2017-07-04 ENCOUNTER — Encounter: Payer: Self-pay | Admitting: Internal Medicine

## 2017-07-04 NOTE — Assessment & Plan Note (Signed)
On zoloft and trazodone.  Doing well on these medications.  Does not feel she needs any further intervention.  Follow.

## 2017-07-04 NOTE — Assessment & Plan Note (Signed)
Continue vitamin D supplements.  

## 2017-07-04 NOTE — Assessment & Plan Note (Signed)
Persistent back and hip pain.  Will check xray.

## 2017-07-04 NOTE — Assessment & Plan Note (Signed)
Low carb diet and exercise.  Follow met b and a1c.  

## 2017-07-04 NOTE — Assessment & Plan Note (Signed)
Blood pressure under good control.  Continue same medication regimen.  Follow pressures.  Follow metabolic panel.   

## 2017-07-04 NOTE — Assessment & Plan Note (Signed)
Saw Dr Tedd SiasSolum.  Taking calcium and vitamin D.  Due f/u.  Will arrange.

## 2017-07-04 NOTE — Assessment & Plan Note (Signed)
On crestor.  Low cholesterol diet and exercise.  Follow lipid panel and liver function tests.   Lab Results  Component Value Date   CHOL 195 06/29/2017   HDL 65.50 06/29/2017   LDLCALC 106 (H) 06/29/2017   LDLDIRECT 190.4 08/30/2013   TRIG 119.0 06/29/2017   CHOLHDL 3 06/29/2017

## 2017-08-13 ENCOUNTER — Other Ambulatory Visit: Payer: Self-pay | Admitting: Internal Medicine

## 2017-08-16 ENCOUNTER — Other Ambulatory Visit: Payer: Self-pay | Admitting: Internal Medicine

## 2017-09-15 ENCOUNTER — Encounter: Payer: Self-pay | Admitting: Internal Medicine

## 2017-09-15 ENCOUNTER — Other Ambulatory Visit: Payer: Self-pay | Admitting: Internal Medicine

## 2017-09-15 NOTE — Telephone Encounter (Signed)
If having pain, please schedule appt.  I can see her 09/24/17 at 10:30.  Let me know if unable to wait this long for appt.

## 2017-09-24 ENCOUNTER — Encounter: Payer: Self-pay | Admitting: Internal Medicine

## 2017-09-24 ENCOUNTER — Ambulatory Visit (INDEPENDENT_AMBULATORY_CARE_PROVIDER_SITE_OTHER): Payer: Managed Care, Other (non HMO) | Admitting: Internal Medicine

## 2017-09-24 ENCOUNTER — Other Ambulatory Visit (HOSPITAL_COMMUNITY)
Admission: RE | Admit: 2017-09-24 | Discharge: 2017-09-24 | Disposition: A | Discharge status: Home / Self Care | Payer: Managed Care, Other (non HMO) | Source: Ambulatory Visit | Attending: Internal Medicine | Admitting: Internal Medicine

## 2017-09-24 VITALS — BP 112/68 | HR 80 | Temp 98.2°F | Resp 20 | Wt 146.2 lb

## 2017-09-24 DIAGNOSIS — F439 Reaction to severe stress, unspecified: Secondary | ICD-10-CM

## 2017-09-24 DIAGNOSIS — R87810 Cervical high risk human papillomavirus (HPV) DNA test positive: Secondary | ICD-10-CM | POA: Diagnosis not present

## 2017-09-24 DIAGNOSIS — Z1151 Encounter for screening for human papillomavirus (HPV): Secondary | ICD-10-CM | POA: Diagnosis not present

## 2017-09-24 DIAGNOSIS — Z124 Encounter for screening for malignant neoplasm of cervix: Secondary | ICD-10-CM | POA: Diagnosis not present

## 2017-09-24 DIAGNOSIS — I10 Essential (primary) hypertension: Secondary | ICD-10-CM

## 2017-09-24 DIAGNOSIS — M889 Osteitis deformans of unspecified bone: Secondary | ICD-10-CM

## 2017-09-24 DIAGNOSIS — R739 Hyperglycemia, unspecified: Secondary | ICD-10-CM

## 2017-09-24 DIAGNOSIS — Z1231 Encounter for screening mammogram for malignant neoplasm of breast: Secondary | ICD-10-CM | POA: Diagnosis not present

## 2017-09-24 DIAGNOSIS — E7800 Pure hypercholesterolemia, unspecified: Secondary | ICD-10-CM | POA: Diagnosis not present

## 2017-09-24 DIAGNOSIS — N644 Mastodynia: Secondary | ICD-10-CM

## 2017-09-24 DIAGNOSIS — E559 Vitamin D deficiency, unspecified: Secondary | ICD-10-CM

## 2017-09-24 DIAGNOSIS — Z1239 Encounter for other screening for malignant neoplasm of breast: Secondary | ICD-10-CM

## 2017-09-24 DIAGNOSIS — Z23 Encounter for immunization: Secondary | ICD-10-CM

## 2017-09-24 NOTE — Progress Notes (Signed)
Patient ID: Rachel Collins, female   DOB: 09-02-1961, 56 y.o.   MRN: [id number]   Subjective:    Patient ID: Rachel Collins, female    DOB: July 26, 1961, 56 y.o.   MRN: [id number]  HPI  Patient here for a scheduled follow up.  States she is doing relatively well.  Has been under increased stress with her job, but she feels she is handling things relatively well.  Stays active.  No chest pain.  No sob.  No acid reflux.  No abdominal pain.  Bowels moving.  She does report some pain in her left breast. States started over the last couple of weeks.  States hurts if she carries something that pushes on that area.  Pressure aggravates.  No nipple discharge.  States she has been doing some activity where she could have aggravated the area.  Has f/u with endocrinology next week.     Past Medical History:  Diagnosis Date  . Esophagitis   . Gastritis   . Gastroparesis   . History of migraine headaches   . Hypercholesterolemia   . Hyperglycemia   . Hypertension    Past Surgical History:  Procedure Laterality Date  . BREAST BIOPSY Left 2010   benign  . CERVICAL DISC SURGERY     C5-C7 fusion  . TUBAL LIGATION     Family History  Problem Relation Age of Onset  . Heart disease Father        myocardial infarction x 5  . Prostate cancer Father   . Uterine cancer Mother   . COPD Mother   . Diabetes Mother   . Heart disease Mother        myocardial infarction  . Breast cancer Mother 19  . Heart disease Paternal Grandmother   . Heart disease Maternal Grandfather    Social History   Social History  . Marital status: Married    Spouse name: N/A  . Number of children: 1  . Years of education: N/A   Social History Main Topics  . Smoking status: Never Smoker  . Smokeless tobacco: Never Used  . Alcohol use 0.0 oz/week     Comment: wine  . Drug use: No  . Sexual activity: Not Asked   Other Topics Concern  . None   Social History Narrative  . None    Outpatient Encounter  Prescriptions as of 09/24/2017  Medication Sig  . amLODipine (NORVASC) 5 MG tablet TAKE 1 TABLET (5 MG TOTAL) BY MOUTH DAILY.  Marland Kitchen azelastine (ASTELIN) 0.1 % nasal spray Place 1 spray into both nostrils 2 (two) times daily. Use in each nostril as directed  . hydrochlorothiazide (HYDRODIURIL) 25 MG tablet Take 1 tablet (25 mg total) by mouth daily.  Marland Kitchen losartan (COZAAR) 100 MG tablet Take 1 tablet (100 mg total) by mouth daily.  . rosuvastatin (CRESTOR) 10 MG tablet Take 1 tablet (10 mg total) by mouth daily.  . rosuvastatin (CRESTOR) 10 MG tablet TAKE 1 TABLET (10 MG TOTAL) BY MOUTH DAILY.  Marland Kitchen sertraline (ZOLOFT) 50 MG tablet Take 1.5 tablets (75 mg total) by mouth daily.  . traZODone (DESYREL) 50 MG tablet Take 1-2 tablets (50-100 mg total) by mouth at bedtime.  . traZODone (DESYREL) 50 MG tablet TAKE 1-2 TABLETS (50-100 MG TOTAL) BY MOUTH AT BEDTIME.   No facility-administered encounter medications on file as of 09/24/2017.     Review of Systems  Constitutional: Negative for appetite change and unexpected weight change.  HENT: Negative for congestion  and sinus pressure.   Respiratory: Negative for cough, chest tightness and shortness of breath.        Breast tenderness as outlined.    Cardiovascular: Negative for chest pain, palpitations and leg swelling.  Gastrointestinal: Negative for abdominal pain, diarrhea, nausea and vomiting.  Genitourinary: Negative for difficulty urinating and dysuria.  Musculoskeletal: Negative for joint swelling and myalgias.  Skin: Negative for color change and rash.  Neurological: Negative for dizziness, light-headedness and headaches.  Psychiatric/Behavioral: Negative for agitation and dysphoric mood.       Objective:    Physical Exam  Constitutional: She appears well-developed and well-nourished. No distress.  HENT:  Nose: Nose normal.  Mouth/Throat: Oropharynx is clear and moist.  Neck: Neck supple. No thyromegaly present.  Cardiovascular: Normal  rate and regular rhythm.   Pulmonary/Chest: Breath sounds normal. No respiratory distress. She has no wheezes.  Breast exam - no nipple discharge or nipple retraction present.  Could not appreciate any nodules or axillary adenopathy.  Pain - has been localized between 11:00-2:00 left breast.   Abdominal: Soft. Bowel sounds are normal. There is no tenderness.  Genitourinary:  Genitourinary Comments: Normal external genitalia.  Vaginal vault without lesions.  Cervix identified.  Pap smear performed.  Could not appreciate any adnexal masses or tenderness.    Musculoskeletal: She exhibits no edema or tenderness.  Lymphadenopathy:    She has no cervical adenopathy.  Skin: No rash noted. No erythema.  Psychiatric: She has a normal mood and affect. Her behavior is normal.    BP 112/68 (BP Location: Left Arm, Patient Position: Sitting, Cuff Size: Normal)   Pulse 80   Temp 98.2 F (36.8 C) (Oral)   Resp 20   Wt 146 lb 3.2 oz (66.3 kg)   SpO2 96%   BMI 24.33 kg/m  Wt Readings from Last 3 Encounters:  09/24/17 146 lb 3.2 oz (66.3 kg)  07/02/17 143 lb 6.4 oz (65 kg)  08/21/16 136 lb (61.7 kg)     Lab Results  Component Value Date   WBC 6.2 08/18/2016   HGB 13.5 08/18/2016   HCT 40.0 08/18/2016   PLT 263.0 08/18/2016   GLUCOSE 121 (H) 06/29/2017   CHOL 195 06/29/2017   TRIG 119.0 06/29/2017   HDL 65.50 06/29/2017   LDLDIRECT 190.4 08/30/2013   LDLCALC 106 (H) 06/29/2017   ALT 28 06/29/2017   AST 20 06/29/2017   NA 140 06/29/2017   K 3.8 06/29/2017   CL 106 06/29/2017   CREATININE 0.68 06/29/2017   BUN 16 06/29/2017   CO2 25 06/29/2017   TSH 1.32 08/18/2016   HGBA1C 6.2 06/29/2017   MICROALBUR 1.1 03/09/2015    Mm Digital Screening Bilateral  Result Date: 09/30/2016 CLINICAL DATA:  Screening. EXAM: DIGITAL SCREENING BILATERAL MAMMOGRAM WITH CAD COMPARISON:  Previous exam(s). ACR Breast Density Category c: The breast tissue is heterogeneously dense, which may obscure small  masses. FINDINGS: There are no findings suspicious for malignancy. Images were processed with CAD. IMPRESSION: No mammographic evidence of malignancy. A result letter of this screening mammogram will be mailed directly to the patient. RECOMMENDATION: Screening mammogram in one year. (Code:SM-B-01Y) BI-RADS CATEGORY  1: Negative. Electronically Signed   By: Ammie Ferrier M.D.   On: 09/30/2016 10:47       Assessment & Plan:   Problem List Items Addressed This Visit    Cervical high risk HPV (human papillomavirus) test positive    F/u pap today.        Hypercholesterolemia  On crestor.  Low cholesterol diet and exercise.  Follow lipid panel and liver function tests.        Hyperglycemia    Low carb diet and exercise.  Follow met b and a1c.        Hypertension    Blood pressure under good control.  Continue same medication regimen.  Follow pressures.  Follow metabolic panel.        Paget disease of bone    Has f/u planned with Dr Gabriel Carina next week.        Stress    On zoloft.  Overall stable.  She feels she is handling things relatively well.  Follow.        Vitamin D deficiency    Follow vitamin D level.        Other Visit Diagnoses    Breast pain, left    -  Primary   Symptoms and exam as outlined.  Due screening.  With pain, change to diagnostic mammogram.     Relevant Orders   MM DIAG BREAST TOMO BILATERAL   US BREAST LTD UNI LEFT INC AXILLA   US BREAST LTD UNI RIGHT INC AXILLA   Breast cancer screening       Relevant Orders   MM DIAG BREAST TOMO BILATERAL   US BREAST LTD UNI LEFT INC AXILLA   US BREAST LTD UNI RIGHT INC AXILLA   Need for immunization against influenza       Relevant Orders   Flu Vaccine QUAD 36+ mos IM (Completed)   Screening for cervical cancer       Relevant Orders   Cytology - PAP (Completed)       Einar Pheasant, MD

## 2017-09-25 LAB — CYTOLOGY - PAP
DIAGNOSIS: NEGATIVE
HPV (WINDOPATH): NOT DETECTED

## 2017-09-27 ENCOUNTER — Encounter: Payer: Self-pay | Admitting: Internal Medicine

## 2017-09-27 NOTE — Assessment & Plan Note (Signed)
Has f/u planned with Dr Tedd Sias next week.

## 2017-09-27 NOTE — Assessment & Plan Note (Signed)
F/u pap today 

## 2017-09-27 NOTE — Assessment & Plan Note (Signed)
On zoloft.  Overall stable.  She feels she is handling things relatively well.  Follow.

## 2017-09-27 NOTE — Assessment & Plan Note (Signed)
On crestor.  Low cholesterol diet and exercise.  Follow lipid panel and liver function tests.   

## 2017-09-27 NOTE — Assessment & Plan Note (Signed)
Low carb diet and exercise.  Follow met b and a1c.

## 2017-09-27 NOTE — Assessment & Plan Note (Signed)
Follow vitamin D level.  

## 2017-09-27 NOTE — Assessment & Plan Note (Signed)
Blood pressure under good control.  Continue same medication regimen.  Follow pressures.  Follow metabolic panel.   

## 2017-10-02 ENCOUNTER — Ambulatory Visit
Admission: RE | Admit: 2017-10-02 | Discharge: 2017-10-02 | Disposition: A | Discharge status: Home / Self Care | Payer: Managed Care, Other (non HMO) | Source: Ambulatory Visit | Attending: Internal Medicine | Admitting: Internal Medicine

## 2017-10-02 DIAGNOSIS — Z1231 Encounter for screening mammogram for malignant neoplasm of breast: Secondary | ICD-10-CM | POA: Diagnosis not present

## 2017-10-02 DIAGNOSIS — Z1239 Encounter for other screening for malignant neoplasm of breast: Secondary | ICD-10-CM

## 2017-10-02 DIAGNOSIS — N644 Mastodynia: Secondary | ICD-10-CM | POA: Insufficient documentation

## 2017-10-06 ENCOUNTER — Ambulatory Visit: Payer: Managed Care, Other (non HMO) | Admitting: Internal Medicine

## 2017-12-29 ENCOUNTER — Other Ambulatory Visit: Payer: Managed Care, Other (non HMO)

## 2018-01-05 ENCOUNTER — Ambulatory Visit: Payer: Managed Care, Other (non HMO) | Admitting: Internal Medicine

## 2018-01-08 ENCOUNTER — Other Ambulatory Visit: Payer: Managed Care, Other (non HMO)

## 2018-03-08 ENCOUNTER — Other Ambulatory Visit: Payer: Managed Care, Other (non HMO)

## 2018-03-09 ENCOUNTER — Telehealth: Payer: Self-pay | Admitting: Radiology

## 2018-03-09 ENCOUNTER — Other Ambulatory Visit: Payer: Self-pay | Admitting: Internal Medicine

## 2018-03-09 DIAGNOSIS — R739 Hyperglycemia, unspecified: Secondary | ICD-10-CM

## 2018-03-09 DIAGNOSIS — E7800 Pure hypercholesterolemia, unspecified: Secondary | ICD-10-CM

## 2018-03-09 DIAGNOSIS — I10 Essential (primary) hypertension: Secondary | ICD-10-CM

## 2018-03-09 NOTE — Telephone Encounter (Signed)
Order placed for labs.

## 2018-03-09 NOTE — Telephone Encounter (Signed)
Pt is coming in for labs tomorrow, please place future orders. Thank you  

## 2018-03-09 NOTE — Progress Notes (Signed)
Order placed for f/u labs.  

## 2018-03-10 ENCOUNTER — Encounter: Payer: Self-pay | Admitting: Internal Medicine

## 2018-03-10 ENCOUNTER — Other Ambulatory Visit (INDEPENDENT_AMBULATORY_CARE_PROVIDER_SITE_OTHER): Payer: Managed Care, Other (non HMO)

## 2018-03-10 DIAGNOSIS — R739 Hyperglycemia, unspecified: Secondary | ICD-10-CM

## 2018-03-10 DIAGNOSIS — I10 Essential (primary) hypertension: Secondary | ICD-10-CM

## 2018-03-10 DIAGNOSIS — E7800 Pure hypercholesterolemia, unspecified: Secondary | ICD-10-CM

## 2018-03-10 LAB — HEMOGLOBIN A1C: HEMOGLOBIN A1C: 6.3 % (ref 4.6–6.5)

## 2018-03-10 LAB — CBC WITH DIFFERENTIAL/PLATELET
BASOS PCT: 1.3 % (ref 0.0–3.0)
Basophils Absolute: 0.1 10*3/uL (ref 0.0–0.1)
EOS PCT: 4.5 % (ref 0.0–5.0)
Eosinophils Absolute: 0.3 10*3/uL (ref 0.0–0.7)
HCT: 40.7 % (ref 36.0–46.0)
Hemoglobin: 13.6 g/dL (ref 12.0–15.0)
Lymphocytes Relative: 33.2 % (ref 12.0–46.0)
Lymphs Abs: 2 10*3/uL (ref 0.7–4.0)
MCHC: 33.4 g/dL (ref 30.0–36.0)
MCV: 90.9 fl (ref 78.0–100.0)
MONO ABS: 0.5 10*3/uL (ref 0.1–1.0)
Monocytes Relative: 8.3 % (ref 3.0–12.0)
NEUTROS ABS: 3.1 10*3/uL (ref 1.4–7.7)
Neutrophils Relative %: 52.7 % (ref 43.0–77.0)
Platelets: 275 10*3/uL (ref 150.0–400.0)
RBC: 4.48 Mil/uL (ref 3.87–5.11)
RDW: 13.6 % (ref 11.5–15.5)
WBC: 6 10*3/uL (ref 4.0–10.5)

## 2018-03-10 LAB — BASIC METABOLIC PANEL
BUN: 18 mg/dL (ref 6–23)
CHLORIDE: 106 meq/L (ref 96–112)
CO2: 23 meq/L (ref 19–32)
Calcium: 9.9 mg/dL (ref 8.4–10.5)
Creatinine, Ser: 0.7 mg/dL (ref 0.40–1.20)
GFR: 91.25 mL/min (ref >60.00)
GLUCOSE: 120 mg/dL — AB (ref 70–99)
POTASSIUM: 4.7 meq/L (ref 3.5–5.1)
Sodium: 144 mEq/L (ref 135–145)

## 2018-03-10 LAB — HEPATIC FUNCTION PANEL
ALT: 25 U/L (ref 0–35)
AST: 19 U/L (ref 0–37)
Albumin: 5 g/dL (ref 3.5–5.2)
Alkaline Phosphatase: 51 U/L (ref 39–117)
Bilirubin, Direct: 0.1 mg/dL (ref 0.0–0.3)
TOTAL PROTEIN: 7.1 g/dL (ref 6.0–8.3)

## 2018-03-10 LAB — LIPID PANEL
Cholesterol: 181 mg/dL (ref 0–200)
HDL: 63.7 mg/dL (ref >39.00)
LDL Cholesterol: 87 mg/dL (ref 0–99)
NonHDL: 117.62
Total CHOL/HDL Ratio: 3
Triglycerides: 152 mg/dL — ABNORMAL HIGH (ref 0.0–149.0)
VLDL: 30.4 mg/dL (ref 0.0–40.0)

## 2018-03-10 LAB — TSH: TSH: 1.96 u[IU]/mL (ref 0.35–4.50)

## 2018-03-16 ENCOUNTER — Encounter: Payer: Self-pay | Admitting: Internal Medicine

## 2018-03-16 ENCOUNTER — Ambulatory Visit: Payer: Managed Care, Other (non HMO) | Admitting: Internal Medicine

## 2018-03-16 VITALS — BP 160/90 | HR 80 | Temp 98.5°F | Resp 16 | Wt 147.6 lb

## 2018-03-16 DIAGNOSIS — M889 Osteitis deformans of unspecified bone: Secondary | ICD-10-CM | POA: Diagnosis not present

## 2018-03-16 DIAGNOSIS — G479 Sleep disorder, unspecified: Secondary | ICD-10-CM | POA: Diagnosis not present

## 2018-03-16 DIAGNOSIS — M2550 Pain in unspecified joint: Secondary | ICD-10-CM | POA: Diagnosis not present

## 2018-03-16 DIAGNOSIS — M79673 Pain in unspecified foot: Secondary | ICD-10-CM | POA: Diagnosis not present

## 2018-03-16 DIAGNOSIS — E559 Vitamin D deficiency, unspecified: Secondary | ICD-10-CM

## 2018-03-16 DIAGNOSIS — R739 Hyperglycemia, unspecified: Secondary | ICD-10-CM | POA: Diagnosis not present

## 2018-03-16 DIAGNOSIS — E7800 Pure hypercholesterolemia, unspecified: Secondary | ICD-10-CM

## 2018-03-16 DIAGNOSIS — R87810 Cervical high risk human papillomavirus (HPV) DNA test positive: Secondary | ICD-10-CM | POA: Diagnosis not present

## 2018-03-16 DIAGNOSIS — I10 Essential (primary) hypertension: Secondary | ICD-10-CM | POA: Diagnosis not present

## 2018-03-16 MED ORDER — HYDROCHLOROTHIAZIDE 25 MG PO TABS: 25.0000 mg | ORAL_TABLET | Freq: Every day | ORAL | 1 refills | Status: DC

## 2018-03-16 NOTE — Progress Notes (Signed)
Patient ID: Rachel Collins, female   DOB: 09-04-1961, 57 y.o.   MRN: [id number]   Subjective:    Patient ID: Rachel Collins, female    DOB: 09-13-1961, 57 y.o.   MRN: [id number]  HPI  Patient here for a scheduled follow up.  She reports she has been having increased joint stiffness and pain.  Reports pain - multiple sites.  Low back pian, bilateral shoulder pain and other joint pain.  Works long hours.  On her feet a lot.  Eats standing.  No chest pain.  No sob.  No acid reflux.  No abdominal pian.  Bowels moving.  Increased stress with her job.  Taking trazodone to help her sleep.  On zoloft.  Does not feel needs to increase the dose.  She has been out of hctz for two weeks.  Blood pressure elevated.     Past Medical History:  Diagnosis Date  . Esophagitis   . Gastritis   . Gastroparesis   . History of migraine headaches   . Hypercholesterolemia   . Hyperglycemia   . Hypertension    Past Surgical History:  Procedure Laterality Date  . BREAST BIOPSY Left 2010   benign  . CERVICAL DISC SURGERY     C5-C7 fusion  . TUBAL LIGATION     Family History  Problem Relation Age of Onset  . Heart disease Father        myocardial infarction x 5  . Prostate cancer Father   . Uterine cancer Mother   . COPD Mother   . Diabetes Mother   . Heart disease Mother        myocardial infarction  . Breast cancer Mother 109  . Heart disease Paternal Grandmother   . Heart disease Maternal Grandfather    Social History   Socioeconomic History  . Marital status: Married    Spouse name: Not on file  . Number of children: 1  . Years of education: Not on file  . Highest education level: Not on file  Occupational History  . Not on file  Social Needs  . Financial resource strain: Not on file  . Food insecurity:    Worry: Not on file    Inability: Not on file  . Transportation needs:    Medical: Not on file    Non-medical: Not on file  Tobacco Use  . Smoking status: Never Smoker  .  Smokeless tobacco: Never Used  Substance and Sexual Activity  . Alcohol use: Yes    Alcohol/week: 0.0 oz    Comment: wine  . Drug use: No  . Sexual activity: Not on file  Lifestyle  . Physical activity:    Days per week: Not on file    Minutes per session: Not on file  . Stress: Not on file  Relationships  . Social connections:    Talks on phone: Not on file    Gets together: Not on file    Attends religious service: Not on file    Active member of club or organization: Not on file    Attends meetings of clubs or organizations: Not on file    Relationship status: Not on file  Other Topics Concern  . Not on file  Social History Narrative  . Not on file    Outpatient Encounter Medications as of 03/16/2018  Medication Sig  . amLODipine (NORVASC) 5 MG tablet TAKE 1 TABLET (5 MG TOTAL) BY MOUTH DAILY.  . rosuvastatin (CRESTOR) 10 MG tablet  Take 1 tablet (10 mg total) by mouth daily.  . sertraline (ZOLOFT) 50 MG tablet Take 1.5 tablets (75 mg total) by mouth daily.  Marland Kitchen azelastine (ASTELIN) 0.1 % nasal spray Place 1 spray into both nostrils 2 (two) times daily. Use in each nostril as directed (Patient not taking: Reported on 03/16/2018)  . hydrochlorothiazide (HYDRODIURIL) 25 MG tablet Take 1 tablet (25 mg total) by mouth daily.  Marland Kitchen losartan (COZAAR) 100 MG tablet Take 1 tablet (100 mg total) by mouth daily.  . traZODone (DESYREL) 50 MG tablet TAKE 1-2 TABLETS (50-100 MG TOTAL) BY MOUTH AT BEDTIME.  . [DISCONTINUED] hydrochlorothiazide (HYDRODIURIL) 25 MG tablet Take 1 tablet (25 mg total) by mouth daily.  . [DISCONTINUED] rosuvastatin (CRESTOR) 10 MG tablet TAKE 1 TABLET (10 MG TOTAL) BY MOUTH DAILY.  . [DISCONTINUED] traZODone (DESYREL) 50 MG tablet Take 1-2 tablets (50-100 mg total) by mouth at bedtime.   No facility-administered encounter medications on file as of 03/16/2018.     Review of Systems  Constitutional: Negative for appetite change and unexpected weight change.  HENT:  Negative for congestion and sinus pressure.   Respiratory: Negative for cough, chest tightness and shortness of breath.   Cardiovascular: Negative for chest pain, palpitations and leg swelling.  Gastrointestinal: Negative for abdominal pain, diarrhea, nausea and vomiting.  Genitourinary: Negative for difficulty urinating and dysuria.  Musculoskeletal:       Increased joint pain and stiffness.  Back pain.  Hip pain.  Bilateral shoulder pain.  Foot pain.    Skin: Negative for color change and rash.  Neurological: Negative for dizziness, light-headedness and headaches.  Psychiatric/Behavioral: Negative for agitation and dysphoric mood.       Objective:    Physical Exam  Constitutional: She appears well-developed and well-nourished. No distress.  HENT:  Nose: Nose normal.  Mouth/Throat: Oropharynx is clear and moist.  Neck: Neck supple. No thyromegaly present.  Cardiovascular: Normal rate and regular rhythm.  Pulmonary/Chest: Breath sounds normal. No respiratory distress. She has no wheezes.  Abdominal: Soft. Bowel sounds are normal. There is no tenderness.  Musculoskeletal: She exhibits no edema or tenderness.  Lymphadenopathy:    She has no cervical adenopathy.  Skin: No rash noted. No erythema.  Psychiatric: She has a normal mood and affect. Her behavior is normal.    BP (!) 160/90 (BP Location: Left Arm, Patient Position: Sitting, Cuff Size: Normal)   Pulse 80   Temp 98.5 F (36.9 C) (Oral)   Resp 16   Wt 147 lb 9.6 oz (67 kg)   SpO2 96%   BMI 24.56 kg/m  Wt Readings from Last 3 Encounters:  03/16/18 147 lb 9.6 oz (67 kg)  09/24/17 146 lb 3.2 oz (66.3 kg)  07/02/17 143 lb 6.4 oz (65 kg)     Lab Results  Component Value Date   WBC 6.0 03/10/2018   HGB 13.6 03/10/2018   HCT 40.7 03/10/2018   PLT 275.0 03/10/2018   GLUCOSE 120 (H) 03/10/2018   CHOL 181 03/10/2018   TRIG 152.0 (H) 03/10/2018   HDL 63.70 03/10/2018   LDLDIRECT 190.4 08/30/2013   LDLCALC 87  03/10/2018   ALT 25 03/10/2018   AST 19 03/10/2018   NA 144 03/10/2018   K 4.7 03/10/2018   CL 106 03/10/2018   CREATININE 0.70 03/10/2018   BUN 18 03/10/2018   CO2 23 03/10/2018   TSH 1.96 03/10/2018   HGBA1C 6.3 03/10/2018   MICROALBUR 1.1 03/09/2015    Mm Diag Breast  Tomo Bilateral  Result Date: 10/02/2017 CLINICAL DATA:  Patient complains of diffuse upper left breast pain.Patient states that she pulled a muscle and the pain is resolving. EXAM: 2D DIGITAL DIAGNOSTIC BILATERAL MAMMOGRAM WITH CAD AND ADJUNCT TOMO COMPARISON:  With priors. ACR Breast Density Category b: There are scattered areas of fibroglandular density. FINDINGS: No suspicious mass, malignant type microcalcifications or distortion detected in either breast. Mammographic images were processed with CAD. IMPRESSION: No evidence of malignancy in either breast. RECOMMENDATION: Bilateral screening mammogram in 1 year is recommended. I have discussed the findings and recommendations with the patient. Results were also provided in writing at the conclusion of the visit. If applicable, a reminder letter will be sent to the patient regarding the next appointment. BI-RADS CATEGORY  1: Negative. Electronically Signed   By: Lillia Mountain M.D.   On: 10/02/2017 14:16       Assessment & Plan:   Problem List Items Addressed This Visit    Cervical high risk HPV (human papillomavirus) test positive    PAP 09/24/17 - negative with negative HPV        Difficulty sleeping    On trazodone.  Desires no changes at this time.        Heel pain - Primary    Persistent increased pain.  Pain to palpation over the achilles.  Refer to podiatry for further evaluation and treatment.        Relevant Orders   Ambulatory referral to Podiatry   Hypercholesterolemia    On crestor.  Low cholesterol diet and exercise.  Follow lipid panel and liver function tests.        Relevant Medications   hydrochlorothiazide (HYDRODIURIL) 25 MG tablet    Hyperglycemia    Low carb diet and exercise.  Follow met b and a1c.       Hypertension    Blood pressure elevated.  Off hctz.  Restart.  Follow pressures.  Get her back in soon to reassess.  Follow metabolic panel.        Relevant Medications   hydrochlorothiazide (HYDRODIURIL) 25 MG tablet   Joint pain    Multiple joint pains and stiffness.  Works long hours.  Check esr and rheum panel.  She plans to start message therapy.  Discussed further w/up.  She would like to start with this first and wants to hold on any further evaluation.        Relevant Orders   Sedimentation rate (Completed)   Rheumatoid factor (Completed)   ANA (Completed)   Paget disease of bone    Followed by endocrinology.        Vitamin D deficiency    Follow vitamin d level.            Einar Pheasant, MD

## 2018-03-17 LAB — SEDIMENTATION RATE: Sed Rate: 7 mm/hr (ref 0–30)

## 2018-03-18 LAB — ANA: Anti Nuclear Antibody(ANA): NEGATIVE

## 2018-03-18 LAB — RHEUMATOID FACTOR

## 2018-03-19 ENCOUNTER — Encounter: Payer: Self-pay | Admitting: Internal Medicine

## 2018-03-19 DIAGNOSIS — M79673 Pain in unspecified foot: Secondary | ICD-10-CM | POA: Insufficient documentation

## 2018-03-19 NOTE — Assessment & Plan Note (Signed)
PAP 09/24/17 - negative with negative HPV

## 2018-03-19 NOTE — Assessment & Plan Note (Signed)
Low carb diet and exercise.  Follow met b and a1c.  

## 2018-03-19 NOTE — Assessment & Plan Note (Signed)
Multiple joint pains and stiffness.  Works long hours.  Check esr and rheum panel.  She plans to start message therapy.  Discussed further w/up.  She would like to start with this first and wants to hold on any further evaluation.

## 2018-03-19 NOTE — Assessment & Plan Note (Signed)
On crestor.  Low cholesterol diet and exercise.  Follow lipid panel and liver function tests.   

## 2018-03-19 NOTE — Assessment & Plan Note (Signed)
Follow vitamin d level.   

## 2018-03-19 NOTE — Assessment & Plan Note (Signed)
On trazodone.  Desires no changes at this time.

## 2018-03-19 NOTE — Assessment & Plan Note (Signed)
Followed by endocrinology 

## 2018-03-19 NOTE — Assessment & Plan Note (Signed)
Persistent increased pain.  Pain to palpation over the achilles.  Refer to podiatry for further evaluation and treatment.

## 2018-03-19 NOTE — Assessment & Plan Note (Signed)
Blood pressure elevated.  Off hctz.  Restart.  Follow pressures.  Get her back in soon to reassess.  Follow metabolic panel.

## 2018-03-25 ENCOUNTER — Encounter: Payer: Self-pay | Admitting: Internal Medicine

## 2018-03-25 NOTE — Telephone Encounter (Signed)
Please call and confirm pt doing ok.  If no acute issues, she can increase the amlodipine to 5mg  bid.  Follow pressures.  Let us know if problems.

## 2018-03-26 ENCOUNTER — Encounter: Payer: Self-pay | Admitting: Internal Medicine

## 2018-04-02 ENCOUNTER — Other Ambulatory Visit: Payer: Self-pay | Admitting: Internal Medicine

## 2018-04-05 ENCOUNTER — Ambulatory Visit: Payer: Managed Care, Other (non HMO) | Admitting: Podiatry

## 2018-05-03 ENCOUNTER — Ambulatory Visit: Payer: Managed Care, Other (non HMO) | Admitting: Podiatry

## 2018-05-28 ENCOUNTER — Telehealth: Payer: Self-pay

## 2018-05-28 NOTE — Telephone Encounter (Signed)
Copied from CRM [id number]. Topic: Quick Communication - See Telephone Encounter >> May 28, 2018  9:22 AM Herby AbrahamJohnson, Shiquita C wrote: CRM for notification. See Telephone encounter for: 05/28/18.  Pt had to reschedule her apt with PCP due to work staffing. Pt would like to come in to see PCP in July if possible due to apt visit. (not showing an opening) scheduled pt on PCP next available which is in October. Pt would like to make sure that it is okay to wait until then OR should she be seen sooner?    Please advise.

## 2018-05-31 NOTE — Telephone Encounter (Signed)
Ok for patient to wait until October or move patient up?

## 2018-06-01 ENCOUNTER — Ambulatory Visit: Payer: Managed Care, Other (non HMO) | Admitting: Internal Medicine

## 2018-06-01 NOTE — Telephone Encounter (Signed)
Called and scheduled pt for 06/21/2018 @ 1:30

## 2018-06-01 NOTE — Telephone Encounter (Signed)
Morrie Sheldonshley, can you help with this.  I can see Ms Tiburcio PeaHarris on 06/21/18.  I was going to put her in at 12:00, but looking at the schedule, my 11:30 pt and my 1:30 pt ride together.  Would it be possible to move my 1:30 pt on 06/21/18 to 12:00 and put Ms Tiburcio PeaHarris in at 1:30.  Thanks

## 2018-06-07 ENCOUNTER — Other Ambulatory Visit: Payer: Self-pay | Admitting: Internal Medicine

## 2018-06-12 ENCOUNTER — Other Ambulatory Visit: Payer: Self-pay | Admitting: Internal Medicine

## 2018-06-21 ENCOUNTER — Ambulatory Visit: Payer: Managed Care, Other (non HMO) | Admitting: Internal Medicine

## 2018-06-21 ENCOUNTER — Encounter: Payer: Self-pay | Admitting: Internal Medicine

## 2018-06-21 VITALS — BP 122/68 | HR 82 | Temp 98.3°F | Resp 18 | Wt 147.0 lb

## 2018-06-21 DIAGNOSIS — R739 Hyperglycemia, unspecified: Secondary | ICD-10-CM

## 2018-06-21 DIAGNOSIS — Z1231 Encounter for screening mammogram for malignant neoplasm of breast: Secondary | ICD-10-CM | POA: Diagnosis not present

## 2018-06-21 DIAGNOSIS — I10 Essential (primary) hypertension: Secondary | ICD-10-CM

## 2018-06-21 DIAGNOSIS — M889 Osteitis deformans of unspecified bone: Secondary | ICD-10-CM

## 2018-06-21 DIAGNOSIS — E559 Vitamin D deficiency, unspecified: Secondary | ICD-10-CM | POA: Diagnosis not present

## 2018-06-21 DIAGNOSIS — Z1239 Encounter for other screening for malignant neoplasm of breast: Secondary | ICD-10-CM

## 2018-06-21 DIAGNOSIS — E7800 Pure hypercholesterolemia, unspecified: Secondary | ICD-10-CM

## 2018-06-21 DIAGNOSIS — M25552 Pain in left hip: Secondary | ICD-10-CM

## 2018-06-21 MED ORDER — CALCITONIN (SALMON) 200 UNIT/ACT NA SOLN: 1.0000 | Freq: Every day | NASAL | 12 refills | Status: DC

## 2018-06-21 NOTE — Progress Notes (Signed)
Patient ID: Rachel Collins, female   DOB: [birth year], 59 y.o.   MRN: [id number]   Subjective:    Patient ID: Rachel Collins, female    DOB: 1961/10/14, 56 y.o.   MRN: [id number]  HPI  Patient here for a scheduled follow up.  She reports she is doing well.  Increased stress with her job, but she feels she is handling things relatively well.  Does not feel needs any further intervention.  Staying active.  No chest pain.  No sob.  No acid reflux.  No abdominal pain.  Bowels moving.  Some hip pain.  Request to be placed back on miacalcin.  Helped.  Discussed further w/up.  She declines.     Past Medical History:  Diagnosis Date  . Esophagitis   . Gastritis   . Gastroparesis   . History of migraine headaches   . Hypercholesterolemia   . Hyperglycemia   . Hypertension    Past Surgical History:  Procedure Laterality Date  . BREAST BIOPSY Left 2010   benign  . CERVICAL DISC SURGERY     C5-C7 fusion  . TUBAL LIGATION     Family History  Problem Relation Age of Onset  . Heart disease Father        myocardial infarction x 5  . Prostate cancer Father   . Uterine cancer Mother   . COPD Mother   . Diabetes Mother   . Heart disease Mother        myocardial infarction  . Breast cancer Mother 5  . Heart disease Paternal Grandmother   . Heart disease Maternal Grandfather    Social History   Socioeconomic History  . Marital status: Married    Spouse name: Not on file  . Number of children: 1  . Years of education: Not on file  . Highest education level: Not on file  Occupational History  . Not on file  Social Needs  . Financial resource strain: Not on file  . Food insecurity:    Worry: Not on file    Inability: Not on file  . Transportation needs:    Medical: Not on file    Non-medical: Not on file  Tobacco Use  . Smoking status: Never Smoker  . Smokeless tobacco: Never Used  Substance and Sexual Activity  . Alcohol use: Yes    Alcohol/week: 0.0 oz    Comment: wine  .  Drug use: No  . Sexual activity: Not on file  Lifestyle  . Physical activity:    Days per week: Not on file    Minutes per session: Not on file  . Stress: Not on file  Relationships  . Social connections:    Talks on phone: Not on file    Gets together: Not on file    Attends religious service: Not on file    Active member of club or organization: Not on file    Attends meetings of clubs or organizations: Not on file    Relationship status: Not on file  Other Topics Concern  . Not on file  Social History Narrative  . Not on file    Outpatient Encounter Medications as of 06/21/2018  Medication Sig  . amLODipine (NORVASC) 5 MG tablet TAKE 1 TABLET (5 MG TOTAL) BY MOUTH DAILY.  . calcitonin, salmon, (MIACALCIN) 200 UNIT/ACT nasal spray Place 1 spray into alternate nostrils daily.  . hydrochlorothiazide (HYDRODIURIL) 25 MG tablet Take 1 tablet (25 mg total) by mouth daily.  Marland Kitchen  losartan (COZAAR) 100 MG tablet TAKE 1 TABLET BY MOUTH EVERY DAY  . rosuvastatin (CRESTOR) 10 MG tablet Take 1 tablet (10 mg total) by mouth daily.  . sertraline (ZOLOFT) 50 MG tablet Take 1.5 tablets (75 mg total) by mouth daily.  . traZODone (DESYREL) 50 MG tablet TAKE 1-2 TABLETS (50-100 MG TOTAL) BY MOUTH AT BEDTIME.  . [DISCONTINUED] azelastine (ASTELIN) 0.1 % nasal spray Place 1 spray into both nostrils 2 (two) times daily. Use in each nostril as directed (Patient not taking: Reported on 03/16/2018)  . [DISCONTINUED] traZODone (DESYREL) 50 MG tablet TAKE 1-2 TABLETS (50-100 MG TOTAL) BY MOUTH AT BEDTIME.   No facility-administered encounter medications on file as of 06/21/2018.     Review of Systems  Constitutional: Negative for appetite change and unexpected weight change.  HENT: Negative for congestion and sinus pressure.   Respiratory: Negative for cough, chest tightness and shortness of breath.   Cardiovascular: Negative for chest pain, palpitations and leg swelling.  Gastrointestinal: Negative for  abdominal pain, diarrhea, nausea and vomiting.  Genitourinary: Negative for difficulty urinating and dysuria.  Musculoskeletal: Negative for myalgias.       Left hip pain as outlined.    Skin: Negative for color change and rash.  Neurological: Negative for dizziness, light-headedness and headaches.  Psychiatric/Behavioral: Negative for agitation and dysphoric mood.       Objective:    Physical Exam  Constitutional: She appears well-developed and well-nourished. No distress.  HENT:  Nose: Nose normal.  Mouth/Throat: Oropharynx is clear and moist.  Neck: Neck supple. No thyromegaly present.  Cardiovascular: Normal rate and regular rhythm.  Pulmonary/Chest: Breath sounds normal. No respiratory distress. She has no wheezes.  Abdominal: Soft. Bowel sounds are normal. There is no tenderness.  Musculoskeletal: She exhibits no edema or tenderness.  Lymphadenopathy:    She has no cervical adenopathy.  Skin: No rash noted. No erythema.  Psychiatric: She has a normal mood and affect. Her behavior is normal.    BP 122/68 (BP Location: Left Arm, Patient Position: Sitting, Cuff Size: Normal)   Pulse 82   Temp 98.3 F (36.8 C) (Oral)   Resp 18   Wt 147 lb (66.7 kg)   SpO2 96%   BMI 24.46 kg/m  Wt Readings from Last 3 Encounters:  06/21/18 147 lb (66.7 kg)  03/16/18 147 lb 9.6 oz (67 kg)  09/24/17 146 lb 3.2 oz (66.3 kg)     Lab Results  Component Value Date   WBC 6.0 03/10/2018   HGB 13.6 03/10/2018   HCT 40.7 03/10/2018   PLT 275.0 03/10/2018   GLUCOSE 120 (H) 03/10/2018   CHOL 181 03/10/2018   TRIG 152.0 (H) 03/10/2018   HDL 63.70 03/10/2018   LDLDIRECT 190.4 08/30/2013   LDLCALC 87 03/10/2018   ALT 25 03/10/2018   AST 19 03/10/2018   NA 144 03/10/2018   K 4.7 03/10/2018   CL 106 03/10/2018   CREATININE 0.70 03/10/2018   BUN 18 03/10/2018   CO2 23 03/10/2018   TSH 1.96 03/10/2018   HGBA1C 6.3 03/10/2018   MICROALBUR 1.1 03/09/2015    Mm Diag Breast Tomo  Bilateral  Result Date: 10/02/2017 CLINICAL DATA:  Patient complains of diffuse upper left breast pain.Patient states that she pulled a muscle and the pain is resolving. EXAM: 2D DIGITAL DIAGNOSTIC BILATERAL MAMMOGRAM WITH CAD AND ADJUNCT TOMO COMPARISON:  With priors. ACR Breast Density Category b: There are scattered areas of fibroglandular density. FINDINGS: No suspicious mass, malignant type  microcalcifications or distortion detected in either breast. Mammographic images were processed with CAD. IMPRESSION: No evidence of malignancy in either breast. RECOMMENDATION: Bilateral screening mammogram in 1 year is recommended. I have discussed the findings and recommendations with the patient. Results were also provided in writing at the conclusion of the visit. If applicable, a reminder letter will be sent to the patient regarding the next appointment. BI-RADS CATEGORY  1: Negative. Electronically Signed   By: Lillia Mountain M.D.   On: 10/02/2017 14:16       Assessment & Plan:   Problem List Items Addressed This Visit    Hypercholesterolemia    On crestor.  Low cholesterol diet and exercise.  Follow lipid panel and liver function tests.        Relevant Orders   Hepatic function panel   Lipid panel   Hyperglycemia    Low carb diet and exercise.  Follow met b and a1c.        Relevant Orders   Hemoglobin A1c   Hypertension    Blood pressure under good control.  Continue same medication regimen.  Follow pressures.  Follow metabolic panel.        Relevant Orders   Basic metabolic panel   Left hip pain    Pain as outlined.  Declines further testing or evaluation.  She declines.  Miacalcin nasal spray refilled.        Paget disease of bone    Has seen endocrinology.  Refill miacalcin.  Declines any further testing or evaluation at this time.        Vitamin D deficiency    Follow vitamin D level.         Other Visit Diagnoses    Breast cancer screening    -  Primary   Relevant Orders     MM 3D SCREEN BREAST BILATERAL       Einar Pheasant, MD

## 2018-06-27 ENCOUNTER — Encounter: Payer: Self-pay | Admitting: Internal Medicine

## 2018-06-27 NOTE — Assessment & Plan Note (Signed)
Pain as outlined.  Declines further testing or evaluation.  She declines.  Miacalcin nasal spray refilled.

## 2018-06-27 NOTE — Assessment & Plan Note (Signed)
Blood pressure under good control.  Continue same medication regimen.  Follow pressures.  Follow metabolic panel.   

## 2018-06-27 NOTE — Assessment & Plan Note (Signed)
On crestor.  Low cholesterol diet and exercise.  Follow lipid panel and liver function tests.   

## 2018-06-27 NOTE — Assessment & Plan Note (Signed)
Follow vitamin D level.  

## 2018-06-27 NOTE — Assessment & Plan Note (Signed)
Low carb diet and exercise.  Follow met b and a1c.   

## 2018-06-27 NOTE — Assessment & Plan Note (Signed)
Has seen endocrinology.  Refill miacalcin.  Declines any further testing or evaluation at this time.

## 2018-07-10 ENCOUNTER — Other Ambulatory Visit: Payer: Self-pay | Admitting: Internal Medicine

## 2018-07-13 IMAGING — MG MM DIGITAL SCREENING BILAT W/ CAD
4 series · 4 of 4 positions shown · non-contrast
Comparison: Previous exam(s).

CLINICAL DATA: Screening.

EXAM:
DIGITAL SCREENING BILATERAL MAMMOGRAM WITH CAD

[L MLO]
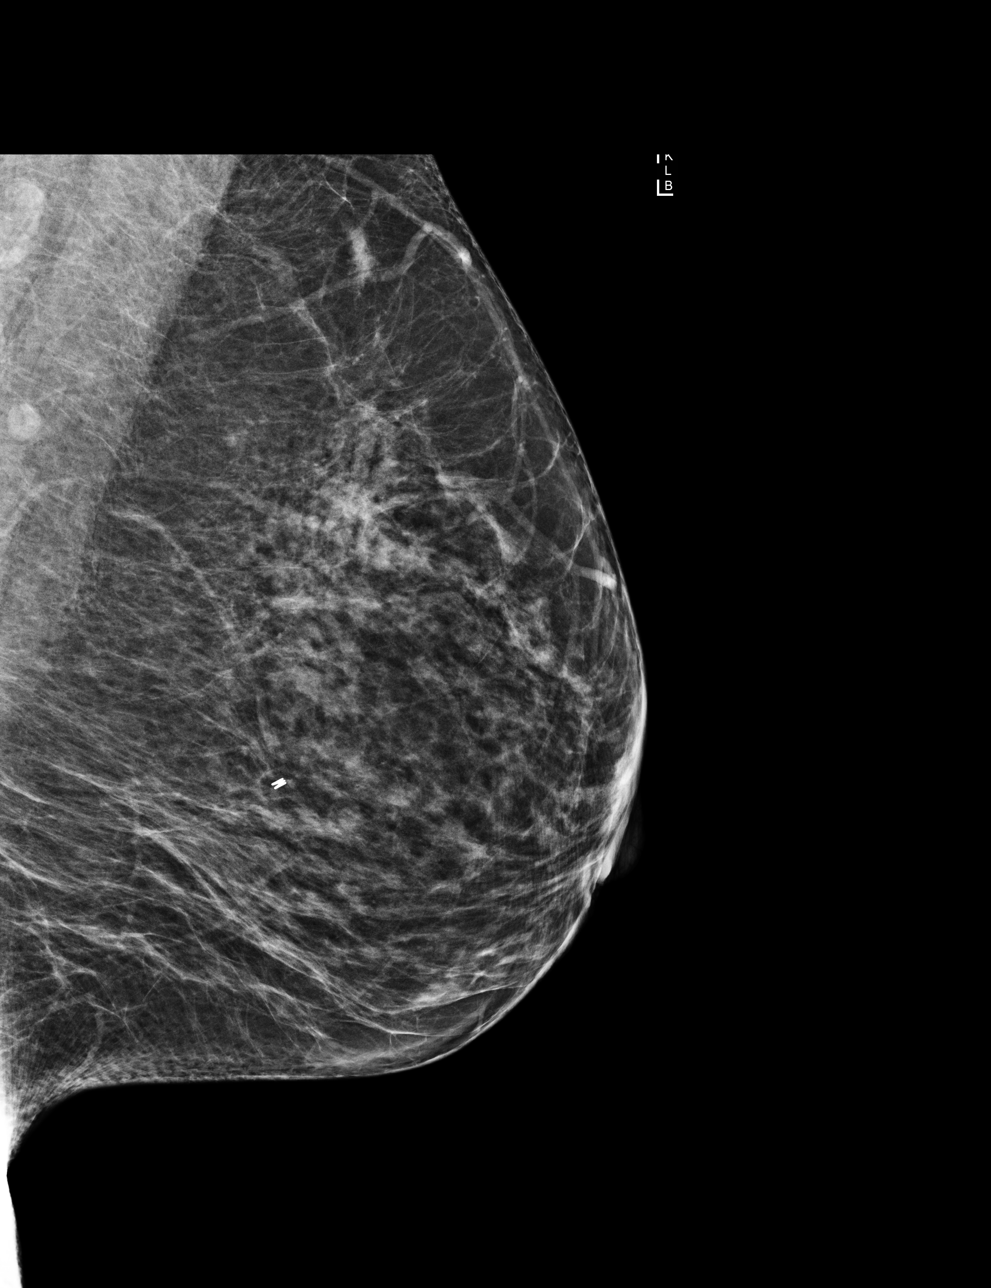

[R MLO]
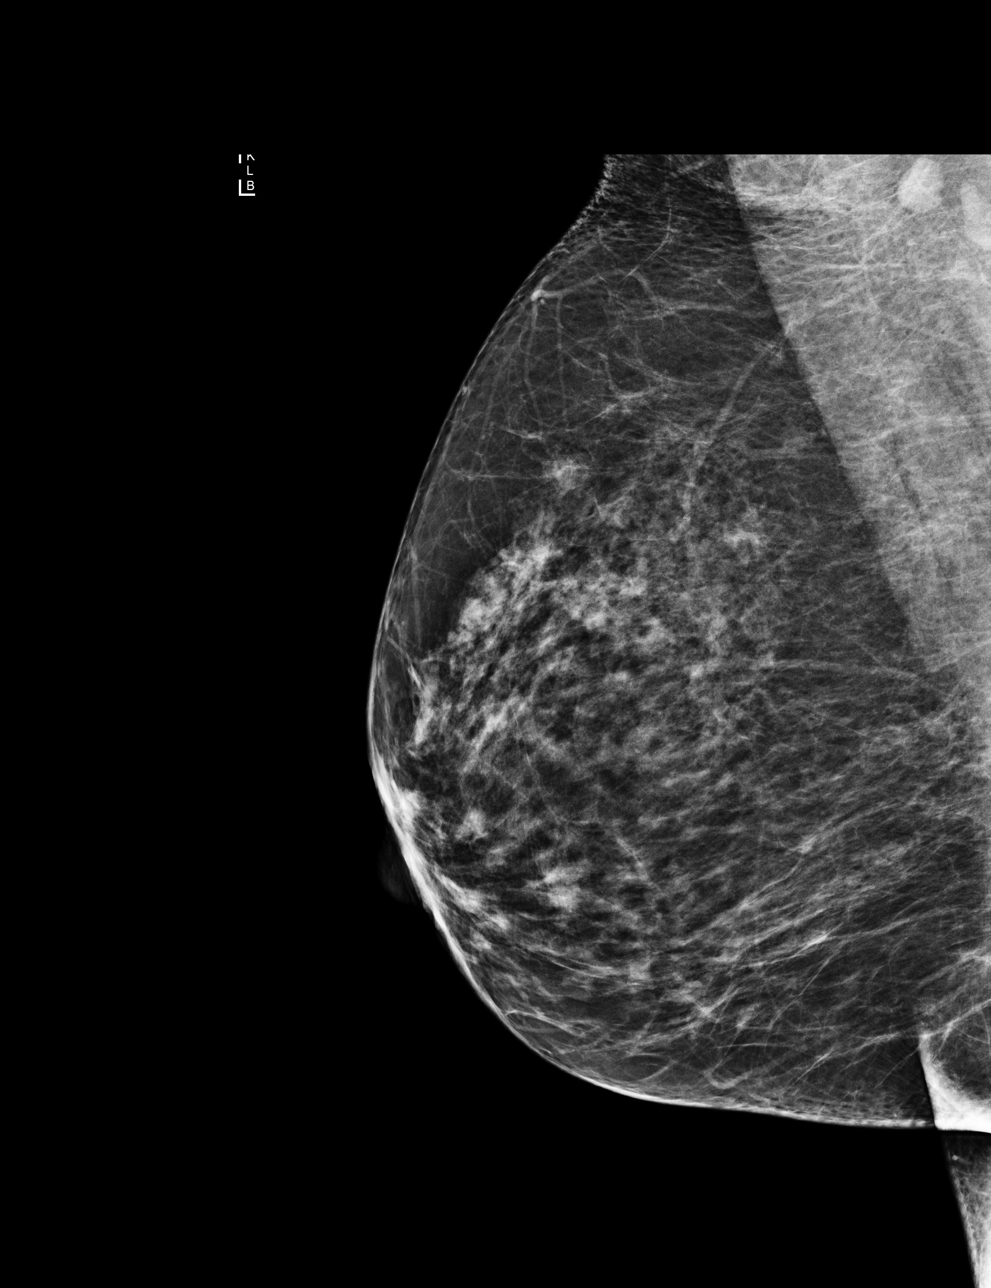

[R CC]
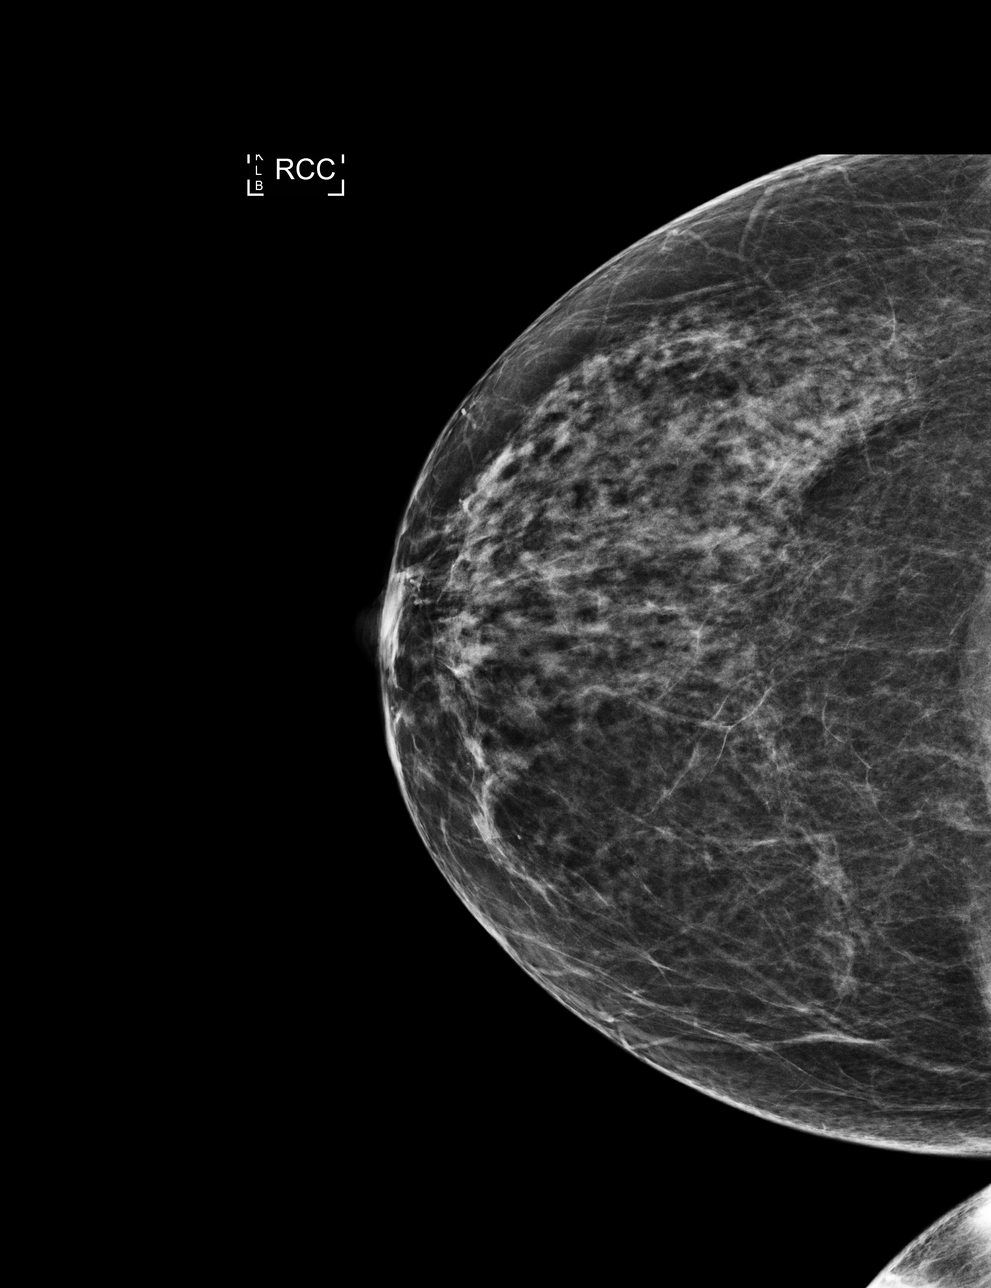

[L CC]
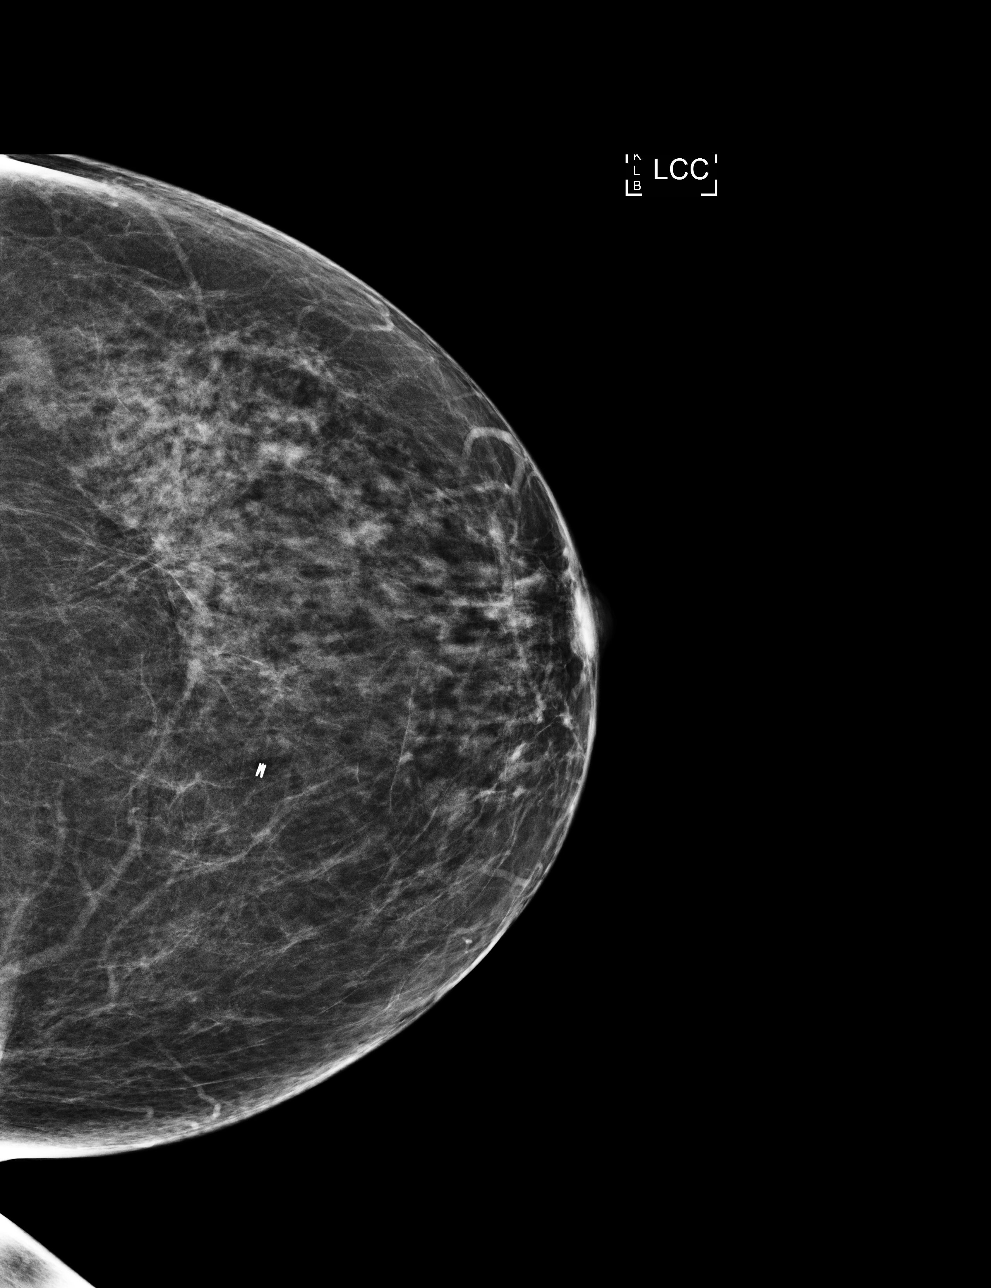

[4 of 4 positions shown; findings below may reference images not displayed]

ACR Breast Density Category c: The breast tissue is heterogeneously
dense, which may obscure small masses.
FINDINGS: There are no findings suspicious for malignancy. Images were
processed with CAD.
IMPRESSION: No mammographic evidence of malignancy. A result letter of this
screening mammogram will be mailed directly to the patient.

RECOMMENDATION:
Screening mammogram in one year. (Code:[id number])

BI-RADS CATEGORY  1: Negative.

## 2018-07-14 ENCOUNTER — Ambulatory Visit
Admission: EM | Admit: 2018-07-14 | Discharge: 2018-07-14 | Disposition: A | Discharge status: Home / Self Care | Payer: Managed Care, Other (non HMO) | Attending: Family Medicine | Admitting: Family Medicine

## 2018-07-14 ENCOUNTER — Encounter: Payer: Self-pay | Admitting: Emergency Medicine

## 2018-07-14 ENCOUNTER — Other Ambulatory Visit: Payer: Self-pay

## 2018-07-14 DIAGNOSIS — J069 Acute upper respiratory infection, unspecified: Secondary | ICD-10-CM | POA: Diagnosis not present

## 2018-07-14 DIAGNOSIS — R112 Nausea with vomiting, unspecified: Secondary | ICD-10-CM

## 2018-07-14 DIAGNOSIS — M791 Myalgia, unspecified site: Secondary | ICD-10-CM | POA: Diagnosis not present

## 2018-07-14 DIAGNOSIS — B9789 Other viral agents as the cause of diseases classified elsewhere: Secondary | ICD-10-CM | POA: Diagnosis not present

## 2018-07-14 DIAGNOSIS — R51 Headache: Secondary | ICD-10-CM | POA: Diagnosis not present

## 2018-07-14 HISTORY — DX: Osteitis deformans of unspecified bone: M88.9

## 2018-07-14 LAB — CBC WITH DIFFERENTIAL/PLATELET
BASOS ABS: 0 10*3/uL (ref 0–0.1)
BASOS PCT: 1 %
EOS ABS: 0 10*3/uL (ref 0–0.7)
EOS PCT: 1 %
HCT: 42.5 % (ref 35.0–47.0)
HEMOGLOBIN: 14.3 g/dL (ref 12.0–16.0)
LYMPHS ABS: 1.1 10*3/uL (ref 1.0–3.6)
Lymphocytes Relative: 24 %
MCH: 30.4 pg (ref 26.0–34.0)
MCHC: 33.6 g/dL (ref 32.0–36.0)
MCV: 90.3 fL (ref 80.0–100.0)
Monocytes Absolute: 0.5 10*3/uL (ref 0.2–0.9)
Monocytes Relative: 11 %
NEUTROS PCT: 63 %
Neutro Abs: 2.9 10*3/uL (ref 1.4–6.5)
PLATELETS: 217 10*3/uL (ref 150–440)
RBC: 4.7 MIL/uL (ref 3.80–5.20)
RDW: 13.9 % (ref 11.5–14.5)
WBC: 4.6 10*3/uL (ref 3.6–11.0)

## 2018-07-14 LAB — LIPASE, BLOOD: Lipase: 36 U/L (ref 11–51)

## 2018-07-14 LAB — COMPREHENSIVE METABOLIC PANEL
ALBUMIN: 4.7 g/dL (ref 3.5–5.0)
ALK PHOS: 63 U/L (ref 38–126)
ALT: 41 U/L (ref 0–44)
AST: 38 U/L (ref 15–41)
Anion gap: 13 (ref 5–15)
BUN: 10 mg/dL (ref 6–20)
CALCIUM: 8.8 mg/dL — AB (ref 8.9–10.3)
CO2: 20 mmol/L — AB (ref 22–32)
CREATININE: 0.7 mg/dL (ref 0.44–1.00)
Chloride: 103 mmol/L (ref 98–111)
GFR calc non Af Amer: >60 mL/min (ref >60)
GLUCOSE: 158 mg/dL — AB (ref 70–99)
Potassium: 2.8 mmol/L — ABNORMAL LOW (ref 3.5–5.1)
SODIUM: 136 mmol/L (ref 135–145)
Total Bilirubin: 0.5 mg/dL (ref 0.3–1.2)
Total Protein: 7.8 g/dL (ref 6.5–8.1)

## 2018-07-14 MED ORDER — ONDANSETRON 8 MG PO TBDP
8.0000 mg | ORAL_TABLET | Freq: Once | ORAL | Status: AC
  Administered 2018-07-14: 8 mg via ORAL

## 2018-07-14 MED ORDER — POTASSIUM CHLORIDE CRYS ER 20 MEQ PO TBCR
40.0000 meq | EXTENDED_RELEASE_TABLET | Freq: Once | ORAL | Status: AC
  Administered 2018-07-14: 40 meq via ORAL

## 2018-07-14 NOTE — ED Triage Notes (Signed)
Patient in today c/o emesis and fever yesterday. Patient seen at [facility] and they were concerned that it may be gallbladder and suggested that patient be evaluated somewhere.

## 2018-07-14 NOTE — ED Triage Notes (Signed)
Patient states that she is not having pain at this time, but did have abdominal pain when provider pressed on her belly yesterday. Patient having headache and body aches as well.

## 2018-07-14 NOTE — ED Provider Notes (Signed)
[facility]    CSN: [id number] Arrival date & time: 07/14/18  1026     History   Chief Complaint Chief Complaint  Patient presents with  . Emesis    HPI Rachel Collins is a 57 y.o. female.   57 yo female with a c/o vomiting and fever yesterday along with 3-4 days of "cold" symptoms. Denies any vomiting today. Denies diarrhea, abdominal pain, sore throat, hematemesis, melena, hematochezia.   The history is provided by the patient.  Emesis    Past Medical History:  Diagnosis Date  . Esophagitis   . Gastritis   . Gastroparesis   . History of migraine headaches   . Hypercholesterolemia   . Hyperglycemia   . Hypertension   . Paget's bone disease     Patient Active Problem List   Diagnosis Date Noted  . Heel pain 03/19/2018  . Joint pain 03/16/2018  . Vitamin D deficiency 08/24/2016  . Paget disease of bone 05/19/2016  . Cervical high risk HPV (human papillomavirus) test positive 12/02/2015  . Left hip pain 11/27/2015  . Pain of right thumb 05/23/2015  . Health care maintenance 05/23/2015  . Nasal drainage 05/23/2015  . Heel pain, bilateral 05/22/2015  . Difficulty sleeping 03/09/2015  . Neck pain 10/09/2014  . Stress 10/09/2014  . Eustachian tube dysfunction 01/29/2014  . URI (upper respiratory infection) 12/23/2013  . Tongue lesion 12/23/2013  . Hypercholesterolemia 11/12/2012  . Hypertension 11/12/2012  . Hyperglycemia 11/12/2012  . GERD (gastroesophageal reflux disease) 11/12/2012    Past Surgical History:  Procedure Laterality Date  . BREAST BIOPSY Left 2010   benign  . CERVICAL DISC SURGERY     C5-C7 fusion  . TUBAL LIGATION      OB History   None      Home Medications    Prior to Admission medications   Medication Sig Start Date End Date Taking? Authorizing Provider  amLODipine (NORVASC) 5 MG tablet TAKE 1 TABLET (5 MG TOTAL) BY MOUTH DAILY. 06/07/18  Yes Arnett, Lyn RecordsMargaret G, FNP  calcitonin, salmon, (MIACALCIN) 200 UNIT/ACT  nasal spray Place 1 spray into alternate nostrils daily. 06/21/18  Yes Dale DurhamScott, Charlene, MD  hydrochlorothiazide (HYDRODIURIL) 25 MG tablet Take 1 tablet (25 mg total) by mouth daily. 03/16/18 07/14/18 Yes Dale DurhamScott, Charlene, MD  losartan (COZAAR) 100 MG tablet TAKE 1 TABLET BY MOUTH EVERY DAY 06/14/18  Yes Dale DurhamScott, Charlene, MD  rosuvastatin (CRESTOR) 10 MG tablet Take 1 tablet (10 mg total) by mouth daily. 07/02/17  Yes Dale DurhamScott, Charlene, MD  sertraline (ZOLOFT) 50 MG tablet TAKE 1.5 TABLETS (75 MG TOTAL) BY MOUTH DAILY. 07/12/18  Yes Dale DurhamScott, Charlene, MD  traZODone (DESYREL) 50 MG tablet TAKE 1-2 TABLETS (50-100 MG TOTAL) BY MOUTH AT BEDTIME. 04/02/18 07/14/18 Yes Dale DurhamScott, Charlene, MD    Family History Family History  Problem Relation Age of Onset  . Heart disease Father        myocardial infarction x 5  . Prostate cancer Father   . Uterine cancer Mother   . COPD Mother   . Diabetes Mother   . Heart disease Mother        myocardial infarction  . Breast cancer Mother [id number]  . Heart disease Paternal Grandmother   . Heart disease Maternal Grandfather     Social History Social History   Tobacco Use  . Smoking status: Never Smoker  . Smokeless tobacco: Never Used  Substance Use Topics  . Alcohol use: Yes    Alcohol/week: 4.2  oz    Types: 7 Glasses of wine per week    Comment: 1 glass with dinner  . Drug use: No     Allergies   Penicillins   Review of Systems Review of Systems  Gastrointestinal: Positive for vomiting.     Physical Exam Triage Vital Signs ED Triage Vitals  Enc Vitals Group     BP 07/14/18 1038 (!) 125/91     Pulse Rate 07/14/18 1038 84     Resp 07/14/18 1038 16     Temp 07/14/18 1038 98.4 F (36.9 C)     Temp Source 07/14/18 1038 Oral     SpO2 07/14/18 1038 98 %     Weight 07/14/18 1039 145 lb (65.8 kg)     Height 07/14/18 1039 5\' 4"  (1.626 m)     Head Circumference --      Peak Flow --      Pain Score 07/14/18 1038 0     Pain Loc --      Pain Edu? --       Excl. in GC? --    No data found.  Updated Vital Signs BP (!) 125/91 (BP Location: Left Arm)   Pulse 84   Temp 98.4 F (36.9 C) (Oral)   Resp 16   Ht 5\' 4"  (1.626 m)   Wt 145 lb (65.8 kg)   SpO2 98%   BMI 24.89 kg/m   Visual Acuity Right Eye Distance:   Left Eye Distance:   Bilateral Distance:    Right Eye Near:   Left Eye Near:    Bilateral Near:     Physical Exam  Constitutional: She appears well-developed and well-nourished. No distress.  Abdominal: Soft. Bowel sounds are normal. She exhibits no distension and no mass. There is tenderness (mild epigastric and right upper quadrant). There is no rebound and no guarding.  Skin: She is not diaphoretic.  Nursing note and vitals reviewed.    UC Treatments / Results  Labs (all labs ordered are listed, but only abnormal results are displayed) Labs Reviewed  COMPREHENSIVE METABOLIC PANEL - Abnormal; Notable for the following components:      Result Value   Potassium 2.8 (*)    CO2 20 (*)    Glucose, Bld 158 (*)    Calcium 8.8 (*)    All other components within normal limits  CBC WITH DIFFERENTIAL/PLATELET  LIPASE, BLOOD    EKG None  Radiology No results found.  Procedures Procedures (including critical care time)  Medications Ordered in UC Medications  ondansetron (ZOFRAN-ODT) disintegrating tablet 8 mg (8 mg Oral Given 07/14/18 1106)  potassium chloride SA (K-DUR,KLOR-CON) CR tablet 40 mEq (40 mEq Oral Given 07/14/18 1142)    Initial Impression / Assessment and Plan / UC Course  I have reviewed the triage vital signs and the nursing notes.  Pertinent labs & imaging results that were available during my care of the patient were reviewed by me and considered in my medical decision making (see chart for details).      Final Clinical Impressions(s) / UC Diagnoses   Final diagnoses:  Non-intractable vomiting with nausea, unspecified vomiting type  Viral URI     Discharge Instructions     Clear  liquids (pedialyte, gatorade, water, broth, ginger ale, etc), rest    ED Prescriptions    None     1. Lab results and diagnosis reviewed with patient; given zofran 8mg  odt and potassium with improvement of symptoms 2. Recommend supportive  treatment as above 3. Follow-up prn if symptoms worsen or don't improve   Controlled Substance Prescriptions [place] Controlled Substance Registry consulted? Not Applicable   Payton Mccallum, MD 07/14/18 [phone]

## 2018-07-14 NOTE — Discharge Instructions (Signed)
Clear liquids (pedialyte, gatorade, water, broth, ginger ale, etc), rest

## 2018-08-17 ENCOUNTER — Other Ambulatory Visit: Payer: Self-pay | Admitting: Internal Medicine

## 2018-08-24 ENCOUNTER — Encounter: Payer: Self-pay | Admitting: Internal Medicine

## 2018-09-09 ENCOUNTER — Other Ambulatory Visit: Payer: Self-pay | Admitting: Internal Medicine

## 2018-09-11 ENCOUNTER — Other Ambulatory Visit: Payer: Self-pay | Admitting: Internal Medicine

## 2018-09-14 ENCOUNTER — Encounter: Payer: Self-pay | Admitting: Internal Medicine

## 2018-10-05 ENCOUNTER — Ambulatory Visit: Payer: Managed Care, Other (non HMO) | Admitting: Internal Medicine

## 2018-10-05 ENCOUNTER — Encounter: Payer: Self-pay | Admitting: Internal Medicine

## 2018-10-05 VITALS — BP 138/80 | HR 74 | Temp 98.2°F | Resp 18 | Wt 141.2 lb

## 2018-10-05 DIAGNOSIS — M25552 Pain in left hip: Secondary | ICD-10-CM

## 2018-10-05 DIAGNOSIS — R739 Hyperglycemia, unspecified: Secondary | ICD-10-CM

## 2018-10-05 DIAGNOSIS — M2550 Pain in unspecified joint: Secondary | ICD-10-CM

## 2018-10-05 DIAGNOSIS — M79605 Pain in left leg: Secondary | ICD-10-CM | POA: Diagnosis not present

## 2018-10-05 DIAGNOSIS — Z1239 Encounter for other screening for malignant neoplasm of breast: Secondary | ICD-10-CM | POA: Diagnosis not present

## 2018-10-05 DIAGNOSIS — M79604 Pain in right leg: Secondary | ICD-10-CM | POA: Diagnosis not present

## 2018-10-05 DIAGNOSIS — Z23 Encounter for immunization: Secondary | ICD-10-CM

## 2018-10-05 DIAGNOSIS — M889 Osteitis deformans of unspecified bone: Secondary | ICD-10-CM

## 2018-10-05 DIAGNOSIS — I10 Essential (primary) hypertension: Secondary | ICD-10-CM

## 2018-10-05 DIAGNOSIS — E559 Vitamin D deficiency, unspecified: Secondary | ICD-10-CM

## 2018-10-05 DIAGNOSIS — E7800 Pure hypercholesterolemia, unspecified: Secondary | ICD-10-CM

## 2018-10-05 DIAGNOSIS — F439 Reaction to severe stress, unspecified: Secondary | ICD-10-CM

## 2018-10-05 LAB — HEMOGLOBIN A1C: HEMOGLOBIN A1C: 6.3 % (ref 4.6–6.5)

## 2018-10-05 LAB — SEDIMENTATION RATE: Sed Rate: 5 mm/hr (ref 0–30)

## 2018-10-05 NOTE — Progress Notes (Signed)
Patient ID: Rachel Collins, female   DOB: May 29, 1961, 57 y.o.   MRN: [id number]   Subjective:    Patient ID: Rachel Collins, female    DOB: 11-03-1961, 57 y.o.   MRN: [id number]  HPI  Patient here for a scheduled follow up.  She reports increased pain in her hips, legs and multiple joints.  Aching.  Constant pain.  When she drives - increased pain.  Toothache type pain.  Not able to lie on left side.  Has pagets.  Has been evaluated by Dr Gabriel Carina.  On miacalcin.  Not helped pain. No chest pain.  No sob.  No acid reflux.  No abdominal pain.  Bowels moving.  Getting married Thanksgiving.  Per Dr Joycie Peek note, is due f/u.     Past Medical History:  Diagnosis Date  . Esophagitis   . Gastritis   . Gastroparesis   . History of migraine headaches   . Hypercholesterolemia   . Hyperglycemia   . Hypertension   . Paget's bone disease    Past Surgical History:  Procedure Laterality Date  . BREAST BIOPSY Left 2010   benign  . CERVICAL DISC SURGERY     C5-C7 fusion  . TUBAL LIGATION     Family History  Problem Relation Age of Onset  . Heart disease Father        myocardial infarction x 5  . Prostate cancer Father   . Uterine cancer Mother   . COPD Mother   . Diabetes Mother   . Heart disease Mother        myocardial infarction  . Breast cancer Mother 63  . Heart disease Paternal Grandmother   . Heart disease Maternal Grandfather    Social History   Socioeconomic History  . Marital status: Married    Spouse name: Not on file  . Number of children: 1  . Years of education: Not on file  . Highest education level: Not on file  Occupational History  . Not on file  Social Needs  . Financial resource strain: Not on file  . Food insecurity:    Worry: Not on file    Inability: Not on file  . Transportation needs:    Medical: Not on file    Non-medical: Not on file  Tobacco Use  . Smoking status: Never Smoker  . Smokeless tobacco: Never Used  Substance and Sexual Activity  .  Alcohol use: Yes    Alcohol/week: 7.0 standard drinks    Types: 7 Glasses of wine per week    Comment: 1 glass with dinner  . Drug use: No  . Sexual activity: Not on file  Lifestyle  . Physical activity:    Days per week: Not on file    Minutes per session: Not on file  . Stress: Not on file  Relationships  . Social connections:    Talks on phone: Not on file    Gets together: Not on file    Attends religious service: Not on file    Active member of club or organization: Not on file    Attends meetings of clubs or organizations: Not on file    Relationship status: Not on file  Other Topics Concern  . Not on file  Social History Narrative  . Not on file    Outpatient Encounter Medications as of 10/05/2018  Medication Sig  . amLODipine (NORVASC) 5 MG tablet TAKE 1 TABLET (5 MG TOTAL) BY MOUTH DAILY.  . calcitonin, salmon, (  MIACALCIN) 200 UNIT/ACT nasal spray Place 1 spray into alternate nostrils daily.  . hydrochlorothiazide (HYDRODIURIL) 25 MG tablet TAKE 1 TABLET BY MOUTH EVERY DAY  . losartan (COZAAR) 100 MG tablet TAKE 1 TABLET BY MOUTH EVERY DAY  . rosuvastatin (CRESTOR) 10 MG tablet TAKE 1 TABLET BY MOUTH EVERY DAY  . sertraline (ZOLOFT) 50 MG tablet TAKE 1.5 TABLETS (75 MG TOTAL) BY MOUTH DAILY.  . traZODone (DESYREL) 50 MG tablet TAKE 1-2 TABLETS (50-100 MG TOTAL) BY MOUTH AT BEDTIME.   No facility-administered encounter medications on file as of 10/05/2018.     Review of Systems  Constitutional: Negative for appetite change and unexpected weight change.  HENT: Negative for congestion and sinus pressure.   Respiratory: Negative for cough, chest tightness and shortness of breath.   Cardiovascular: Negative for chest pain, palpitations and leg swelling.  Gastrointestinal: Negative for abdominal pain, diarrhea, nausea and vomiting.  Genitourinary: Negative for difficulty urinating and dysuria.  Musculoskeletal: Negative for gait problem.       Joint pain and aching as  outlined.    Skin: Negative for color change and rash.  Neurological: Negative for dizziness, light-headedness and headaches.  Psychiatric/Behavioral: Negative for agitation and dysphoric mood.       Objective:    Physical Exam  Constitutional: She appears well-developed and well-nourished. No distress.  HENT:  Nose: Nose normal.  Mouth/Throat: Oropharynx is clear and moist.  Neck: Neck supple. No thyromegaly present.  Cardiovascular: Normal rate and regular rhythm.  Pulmonary/Chest: Breath sounds normal. No respiratory distress. She has no wheezes.  Abdominal: Soft. Bowel sounds are normal. There is no tenderness.  Musculoskeletal: She exhibits no edema or tenderness.  Lymphadenopathy:    She has no cervical adenopathy.  Skin: No rash noted. No erythema.  Psychiatric: She has a normal mood and affect. Her behavior is normal.    BP 138/80 (BP Location: Left Arm, Patient Position: Sitting, Cuff Size: Normal)   Pulse 74   Temp 98.2 F (36.8 C) (Oral)   Resp 18   Wt 141 lb 3.2 oz (64 kg)   SpO2 97%   BMI 24.24 kg/m  Wt Readings from Last 3 Encounters:  10/05/18 141 lb 3.2 oz (64 kg)  07/14/18 145 lb (65.8 kg)  06/21/18 147 lb (66.7 kg)     Lab Results  Component Value Date   WBC 4.6 07/14/2018   HGB 14.3 07/14/2018   HCT 42.5 07/14/2018   PLT 217 07/14/2018   GLUCOSE 112 (H) 10/05/2018   CHOL 166 10/05/2018   TRIG 152.0 (H) 03/10/2018   HDL 68 10/05/2018   LDLDIRECT 190.4 08/30/2013   LDLCALC CANCELED 10/05/2018   ALT 29 10/05/2018   AST 25 10/05/2018   NA 142 10/05/2018   K 4.5 10/05/2018   CL 103 10/05/2018   CREATININE 0.75 10/05/2018   BUN 18 10/05/2018   CO2 20 10/05/2018   TSH 1.96 03/10/2018   HGBA1C 6.3 10/05/2018   MICROALBUR 1.1 03/09/2015       Assessment & Plan:   Problem List Items Addressed This Visit    Hypercholesterolemia    Will hold crestor to confirm not contributing to the aching.  Low cholesterol diet and exercise.  Follow  lipid panel.       Relevant Orders   Basic Metabolic Panel (BMET) (Completed)   Hepatic function panel (Completed)   Lipid Profile (Completed)   Hyperglycemia    Low carb diet and exercise.  Follow met b and a1c.  Hypertension    Blood pressure under good control.  Continue same medication regimen.  Follow pressures.  Follow metabolic panel.        Joint pain    Joint pains and stiffness as outlined.  Worsened recently.  Involves not only her left hip, but multiple joints and overall aching.  Stop crestor.  Check esr, RF and ANA.  Consider f/u bone scan.  Will need f/u regarding her pagets.        Left hip pain    Increased pain as outlined.  Has pagets.  Due f/u.  Check labs as outlined.  May need f/u bone scan.        Relevant Orders   Rheumatoid factor (Completed)   Paget disease of bone    Saw endocrinology.  On miacalcin.  Increased pain.  Check labs as outlined.  Due f/u.  Will need f/u bone scan.        Stress    On zoloft.  Increased stress with above pain, etc.  Does not feel needs any further intervention.  Follow.        Vitamin D deficiency    Follow vitamin D level.         Other Visit Diagnoses    Breast cancer screening    -  Primary   Bilateral leg pain       Relevant Orders   Sedimentation rate (Completed)   ANA (Completed)   CK (Creatine Kinase) (Completed)   C-reactive protein (Completed)   Need for immunization against influenza       Relevant Orders   Flu Vaccine QUAD 36+ mos IM (Completed)       Einar Pheasant, MD

## 2018-10-06 LAB — HEPATIC FUNCTION PANEL
AG RATIO: 2.1 (calc) (ref 1.0–2.5)
ALT: 29 U/L (ref 6–29)
AST: 25 U/L (ref 10–35)
Albumin: 4.9 g/dL (ref 3.6–5.1)
Bilirubin, Direct: 0.1 mg/dL (ref 0.0–0.2)
Globulin: 2.3 g/dL (calc) (ref 1.9–3.7)
Total Protein: 7.2 g/dL (ref 6.1–8.1)

## 2018-10-06 LAB — LIPID PANEL
CHOL/HDL RATIO: 2.4 (calc) (ref <5.0)
CHOLESTEROL: 166 mg/dL (ref <200)
HDL: 68 mg/dL (ref >50)
Non-HDL Cholesterol (Calc): 98 mg/dL (calc) (ref <130)

## 2018-10-06 LAB — BASIC METABOLIC PANEL
BUN: 18 mg/dL (ref 7–25)
CALCIUM: 9.9 mg/dL (ref 8.6–10.4)
CHLORIDE: 103 mmol/L (ref 98–110)
CO2: 20 mmol/L (ref 20–32)
CREATININE: 0.75 mg/dL (ref 0.50–1.05)
Glucose, Bld: 112 mg/dL — ABNORMAL HIGH (ref 65–99)
POTASSIUM: 4.5 mmol/L (ref 3.5–5.3)
Sodium: 142 mmol/L (ref 135–146)

## 2018-10-06 LAB — C-REACTIVE PROTEIN: CRP: 3 mg/L (ref <8.0)

## 2018-10-06 LAB — ANA: Anti Nuclear Antibody(ANA): NEGATIVE

## 2018-10-06 LAB — RHEUMATOID FACTOR: Rhuematoid fact SerPl-aCnc: 32 IU/mL — ABNORMAL HIGH (ref <14)

## 2018-10-07 ENCOUNTER — Other Ambulatory Visit: Payer: Self-pay | Admitting: Radiology

## 2018-10-07 DIAGNOSIS — M79604 Pain in right leg: Secondary | ICD-10-CM

## 2018-10-07 DIAGNOSIS — M79605 Pain in left leg: Principal | ICD-10-CM

## 2018-10-08 ENCOUNTER — Other Ambulatory Visit: Payer: Self-pay | Admitting: Internal Medicine

## 2018-10-08 ENCOUNTER — Other Ambulatory Visit (INDEPENDENT_AMBULATORY_CARE_PROVIDER_SITE_OTHER): Payer: Managed Care, Other (non HMO)

## 2018-10-08 DIAGNOSIS — M79605 Pain in left leg: Secondary | ICD-10-CM | POA: Diagnosis not present

## 2018-10-08 DIAGNOSIS — M0580 Other rheumatoid arthritis with rheumatoid factor of unspecified site: Secondary | ICD-10-CM

## 2018-10-08 DIAGNOSIS — M79604 Pain in right leg: Secondary | ICD-10-CM

## 2018-10-08 LAB — CK: Total CK: 120 U/L (ref 7–177)

## 2018-10-08 NOTE — Progress Notes (Signed)
Order placed for rheumatology referral.  

## 2018-10-09 ENCOUNTER — Encounter: Payer: Self-pay | Admitting: Internal Medicine

## 2018-10-10 ENCOUNTER — Encounter: Payer: Self-pay | Admitting: Internal Medicine

## 2018-10-10 NOTE — Assessment & Plan Note (Signed)
On zoloft.  Increased stress with above pain, etc.  Does not feel needs any further intervention.  Follow.

## 2018-10-10 NOTE — Assessment & Plan Note (Signed)
Follow vitamin D level.  

## 2018-10-10 NOTE — Assessment & Plan Note (Signed)
Will hold crestor to confirm not contributing to the aching.  Low cholesterol diet and exercise.  Follow lipid panel.

## 2018-10-10 NOTE — Assessment & Plan Note (Signed)
Saw endocrinology.  On miacalcin.  Increased pain.  Check labs as outlined.  Due f/u.  Will need f/u bone scan.

## 2018-10-10 NOTE — Assessment & Plan Note (Signed)
Increased pain as outlined.  Has pagets.  Due f/u.  Check labs as outlined.  May need f/u bone scan.

## 2018-10-10 NOTE — Assessment & Plan Note (Signed)
Low carb diet and exercise.  Follow met b and a1c.   

## 2018-10-10 NOTE — Assessment & Plan Note (Signed)
Joint pains and stiffness as outlined.  Worsened recently.  Involves not only her left hip, but multiple joints and overall aching.  Stop crestor.  Check esr, RF and ANA.  Consider f/u bone scan.  Will need f/u regarding her pagets.

## 2018-10-10 NOTE — Assessment & Plan Note (Signed)
Blood pressure under good control.  Continue same medication regimen.  Follow pressures.  Follow metabolic panel.   

## 2018-12-02 ENCOUNTER — Other Ambulatory Visit: Payer: Self-pay | Admitting: Internal Medicine

## 2018-12-07 ENCOUNTER — Ambulatory Visit: Payer: Managed Care, Other (non HMO) | Admitting: Internal Medicine

## 2019-01-05 ENCOUNTER — Encounter: Payer: Self-pay | Admitting: Internal Medicine

## 2019-01-05 DIAGNOSIS — Z1211 Encounter for screening for malignant neoplasm of colon: Secondary | ICD-10-CM

## 2019-01-05 NOTE — Telephone Encounter (Signed)
Order placed for GI referral.   

## 2019-01-05 NOTE — Telephone Encounter (Signed)
Patient was due for colonoscopy in 11/2015.

## 2019-01-06 ENCOUNTER — Ambulatory Visit
Admission: RE | Admit: 2019-01-06 | Discharge: 2019-01-06 | Disposition: A | Discharge status: Home / Self Care | Payer: Managed Care, Other (non HMO) | Source: Ambulatory Visit | Attending: Internal Medicine | Admitting: Internal Medicine

## 2019-01-06 DIAGNOSIS — Z1239 Encounter for other screening for malignant neoplasm of breast: Secondary | ICD-10-CM

## 2019-01-23 ENCOUNTER — Other Ambulatory Visit: Payer: Self-pay | Admitting: Internal Medicine

## 2019-01-26 ENCOUNTER — Other Ambulatory Visit: Payer: Self-pay | Admitting: Internal Medicine

## 2019-03-14 ENCOUNTER — Encounter: Payer: Self-pay | Admitting: Internal Medicine

## 2019-03-16 ENCOUNTER — Encounter: Payer: Self-pay | Admitting: Internal Medicine

## 2019-03-16 DIAGNOSIS — E7800 Pure hypercholesterolemia, unspecified: Secondary | ICD-10-CM

## 2019-03-16 DIAGNOSIS — M545 Low back pain, unspecified: Secondary | ICD-10-CM

## 2019-03-16 DIAGNOSIS — R739 Hyperglycemia, unspecified: Secondary | ICD-10-CM

## 2019-03-16 DIAGNOSIS — I10 Essential (primary) hypertension: Secondary | ICD-10-CM

## 2019-03-16 DIAGNOSIS — R5383 Other fatigue: Secondary | ICD-10-CM

## 2019-03-16 NOTE — Telephone Encounter (Signed)
I ordered labs.  Ok to schedule lab appt.

## 2019-03-19 ENCOUNTER — Other Ambulatory Visit: Payer: Self-pay | Admitting: Family

## 2019-03-21 ENCOUNTER — Other Ambulatory Visit (INDEPENDENT_AMBULATORY_CARE_PROVIDER_SITE_OTHER): Payer: Managed Care, Other (non HMO)

## 2019-03-21 ENCOUNTER — Other Ambulatory Visit: Payer: Self-pay

## 2019-03-21 DIAGNOSIS — I10 Essential (primary) hypertension: Secondary | ICD-10-CM | POA: Diagnosis not present

## 2019-03-21 DIAGNOSIS — R5383 Other fatigue: Secondary | ICD-10-CM

## 2019-03-21 DIAGNOSIS — R739 Hyperglycemia, unspecified: Secondary | ICD-10-CM | POA: Diagnosis not present

## 2019-03-21 DIAGNOSIS — M545 Low back pain, unspecified: Secondary | ICD-10-CM

## 2019-03-21 DIAGNOSIS — E7800 Pure hypercholesterolemia, unspecified: Secondary | ICD-10-CM

## 2019-03-21 LAB — IBC PANEL
Iron: 107 ug/dL (ref 42–145)
Saturation Ratios: 30.1 % (ref 20.0–50.0)
Transferrin: 254 mg/dL (ref 212.0–360.0)

## 2019-03-21 LAB — URINALYSIS, ROUTINE W REFLEX MICROSCOPIC
BILIRUBIN URINE: NEGATIVE
Hgb urine dipstick: NEGATIVE
KETONES UR: NEGATIVE
Leukocytes,Ua: NEGATIVE
NITRITE: NEGATIVE
RBC / HPF: NONE SEEN (ref 0–?)
Specific Gravity, Urine: 1.025 (ref 1.000–1.030)
Total Protein, Urine: NEGATIVE
URINE GLUCOSE: NEGATIVE
UROBILINOGEN UA: 0.2 (ref 0.0–1.0)
pH: 5.5 (ref 5.0–8.0)

## 2019-03-21 LAB — CBC WITH DIFFERENTIAL/PLATELET
Basophils Absolute: 0.1 10*3/uL (ref 0.0–0.1)
Basophils Relative: 1.1 % (ref 0.0–3.0)
Eosinophils Absolute: 0.2 10*3/uL (ref 0.0–0.7)
Eosinophils Relative: 3.3 % (ref 0.0–5.0)
HCT: 40.5 % (ref 36.0–46.0)
Hemoglobin: 13.6 g/dL (ref 12.0–15.0)
Lymphocytes Relative: 30.7 % (ref 12.0–46.0)
Lymphs Abs: 1.8 10*3/uL (ref 0.7–4.0)
MCHC: 33.5 g/dL (ref 30.0–36.0)
MCV: 92.4 fl (ref 78.0–100.0)
MONO ABS: 0.5 10*3/uL (ref 0.1–1.0)
Monocytes Relative: 8.2 % (ref 3.0–12.0)
NEUTROS ABS: 3.4 10*3/uL (ref 1.4–7.7)
Neutrophils Relative %: 56.7 % (ref 43.0–77.0)
PLATELETS: 278 10*3/uL (ref 150.0–400.0)
RBC: 4.39 Mil/uL (ref 3.87–5.11)
RDW: 13.7 % (ref 11.5–15.5)
WBC: 6 10*3/uL (ref 4.0–10.5)

## 2019-03-21 LAB — LIPID PANEL
CHOLESTEROL: 301 mg/dL — AB (ref 0–200)
HDL: 62.8 mg/dL (ref >39.00)
LDL Cholesterol: 210 mg/dL — ABNORMAL HIGH (ref 0–99)
NonHDL: 237.77
TRIGLYCERIDES: 137 mg/dL (ref 0.0–149.0)
Total CHOL/HDL Ratio: 5
VLDL: 27.4 mg/dL (ref 0.0–40.0)

## 2019-03-21 LAB — HEPATIC FUNCTION PANEL
ALT: 25 U/L (ref 0–35)
AST: 20 U/L (ref 0–37)
Albumin: 4.8 g/dL (ref 3.5–5.2)
Alkaline Phosphatase: 57 U/L (ref 39–117)
Bilirubin, Direct: 0.1 mg/dL (ref 0.0–0.3)
TOTAL PROTEIN: 6.8 g/dL (ref 6.0–8.3)
Total Bilirubin: 0.6 mg/dL (ref 0.2–1.2)

## 2019-03-21 LAB — BASIC METABOLIC PANEL
BUN: 22 mg/dL (ref 6–23)
CO2: 27 meq/L (ref 19–32)
Calcium: 9.7 mg/dL (ref 8.4–10.5)
Chloride: 101 mEq/L (ref 96–112)
Creatinine, Ser: 0.65 mg/dL (ref 0.40–1.20)
GFR: 93.19 mL/min (ref >60.00)
Glucose, Bld: 103 mg/dL — ABNORMAL HIGH (ref 70–99)
POTASSIUM: 4.9 meq/L (ref 3.5–5.1)
Sodium: 137 mEq/L (ref 135–145)

## 2019-03-21 LAB — HEMOGLOBIN A1C: Hgb A1c MFr Bld: 6.4 % (ref 4.6–6.5)

## 2019-03-21 LAB — VITAMIN B12: Vitamin B-12: 261 pg/mL (ref 211–911)

## 2019-03-21 LAB — TSH: TSH: 1.34 u[IU]/mL (ref 0.35–4.50)

## 2019-03-21 LAB — FERRITIN: Ferritin: 93.6 ng/mL (ref 10.0–291.0)

## 2019-03-22 ENCOUNTER — Other Ambulatory Visit: Payer: Self-pay

## 2019-03-22 ENCOUNTER — Ambulatory Visit: Payer: Managed Care, Other (non HMO) | Admitting: Internal Medicine

## 2019-03-22 DIAGNOSIS — M545 Low back pain, unspecified: Secondary | ICD-10-CM

## 2019-03-22 DIAGNOSIS — Z9109 Other allergy status, other than to drugs and biological substances: Secondary | ICD-10-CM

## 2019-03-22 DIAGNOSIS — R739 Hyperglycemia, unspecified: Secondary | ICD-10-CM

## 2019-03-22 DIAGNOSIS — I10 Essential (primary) hypertension: Secondary | ICD-10-CM

## 2019-03-22 DIAGNOSIS — E7800 Pure hypercholesterolemia, unspecified: Secondary | ICD-10-CM

## 2019-03-22 DIAGNOSIS — M889 Osteitis deformans of unspecified bone: Secondary | ICD-10-CM

## 2019-03-22 DIAGNOSIS — F439 Reaction to severe stress, unspecified: Secondary | ICD-10-CM

## 2019-03-22 DIAGNOSIS — G479 Sleep disorder, unspecified: Secondary | ICD-10-CM

## 2019-03-22 DIAGNOSIS — R400 Somnolence: Secondary | ICD-10-CM

## 2019-03-22 DIAGNOSIS — E559 Vitamin D deficiency, unspecified: Secondary | ICD-10-CM

## 2019-03-22 LAB — URINE CULTURE
MICRO NUMBER:: 362265
RESULT: NO GROWTH
SPECIMEN QUALITY:: ADEQUATE

## 2019-03-22 MED ORDER — TIZANIDINE HCL 2 MG PO CAPS: 2.0000 mg | ORAL_CAPSULE | Freq: Every evening | ORAL | 0 refills | Status: DC | PRN

## 2019-03-22 MED ORDER — LOVASTATIN 20 MG PO TABS: 20.0000 mg | ORAL_TABLET | Freq: Every day | ORAL | 2 refills | Status: DC

## 2019-03-22 NOTE — Progress Notes (Signed)
Subjective:    Patient ID: Rachel Collins, female    DOB: 1961-04-29, 58 y.o.   MRN: [id number]  HPI  Patient here for a scheduled follow up.  She reports she is doing relatively well.  Has been doing a lot more physical activity - lifting, pushing, pulling. Back has been bothering her more.  Discussed further w/up and evaluation.  She declines at this time.  No chest pain.  No sob.  No acid reflux.  No abdominal pain.  Bowels moving.  No urine change.  Does report some fatigue and daytime somnolence.  Does not sleep well.  Question of snoring.  Discussed the possibility of sleep apnea.  Discussed further w/up. She is agreeable.     Past Medical History:  Diagnosis Date  . Esophagitis   . Gastritis   . Gastroparesis   . History of migraine headaches   . Hypercholesterolemia   . Hyperglycemia   . Hypertension   . Paget's bone disease    Past Surgical History:  Procedure Laterality Date  . BREAST BIOPSY Left 2010   benign  . CERVICAL DISC SURGERY     C5-C7 fusion  . TUBAL LIGATION     Family History  Problem Relation Age of Onset  . Heart disease Father        myocardial infarction x 5  . Prostate cancer Father   . Uterine cancer Mother   . COPD Mother   . Diabetes Mother   . Heart disease Mother        myocardial infarction  . Breast cancer Mother 49  . Heart disease Paternal Grandmother   . Heart disease Maternal Grandfather    Social History   Socioeconomic History  . Marital status: Married    Spouse name: Not on file  . Number of children: 1  . Years of education: Not on file  . Highest education level: Not on file  Occupational History  . Not on file  Social Needs  . Financial resource strain: Not on file  . Food insecurity:    Worry: Not on file    Inability: Not on file  . Transportation needs:    Medical: Not on file    Non-medical: Not on file  Tobacco Use  . Smoking status: Never Smoker  . Smokeless tobacco: Never Used  Substance and Sexual  Activity  . Alcohol use: Yes    Alcohol/week: 7.0 standard drinks    Types: 7 Glasses of wine per week    Comment: 1 glass with dinner  . Drug use: No  . Sexual activity: Not on file  Lifestyle  . Physical activity:    Days per week: Not on file    Minutes per session: Not on file  . Stress: Not on file  Relationships  . Social connections:    Talks on phone: Not on file    Gets together: Not on file    Attends religious service: Not on file    Active member of club or organization: Not on file    Attends meetings of clubs or organizations: Not on file    Relationship status: Not on file  Other Topics Concern  . Not on file  Social History Narrative  . Not on file    Outpatient Encounter Medications as of 03/22/2019  Medication Sig  . amLODipine (NORVASC) 5 MG tablet TAKE 1 TABLET BY MOUTH EVERY DAY  . calcitonin, salmon, (MIACALCIN) 200 UNIT/ACT nasal spray Place 1 spray into  alternate nostrils daily.  . hydrochlorothiazide (HYDRODIURIL) 25 MG tablet TAKE 1 TABLET BY MOUTH EVERY DAY  . losartan (COZAAR) 100 MG tablet TAKE 1 TABLET BY MOUTH EVERY DAY  . lovastatin (MEVACOR) 20 MG tablet Take 1 tablet (20 mg total) by mouth at bedtime.  . sertraline (ZOLOFT) 50 MG tablet TAKE 1.5 TABLETS (75 MG TOTAL) BY MOUTH DAILY.  . tizanidine (ZANAFLEX) 2 MG capsule Take 1 capsule (2 mg total) by mouth at bedtime as needed for muscle spasms.  . traZODone (DESYREL) 50 MG tablet TAKE 1-2 TABLETS (50-100 MG TOTAL) BY MOUTH AT BEDTIME.  . [DISCONTINUED] rosuvastatin (CRESTOR) 10 MG tablet TAKE 1 TABLET BY MOUTH EVERY DAY   No facility-administered encounter medications on file as of 03/22/2019.     Review of Systems  Constitutional: Positive for fatigue. Negative for appetite change and unexpected weight change.  HENT: Negative for congestion and sinus pressure.   Respiratory: Negative for cough, chest tightness and shortness of breath.   Cardiovascular: Negative for chest pain,  palpitations and leg swelling.  Gastrointestinal: Negative for abdominal pain, diarrhea, nausea and vomiting.  Genitourinary: Negative for difficulty urinating and dysuria.  Musculoskeletal: Positive for back pain. Negative for joint swelling.  Skin: Negative for color change and rash.  Neurological: Negative for dizziness, light-headedness and headaches.  Psychiatric/Behavioral: Negative for agitation and dysphoric mood.       Objective:    Physical Exam Constitutional:      General: She is not in acute distress.    Appearance: Normal appearance.  HENT:     Nose: Nose normal. No congestion.     Mouth/Throat:     Pharynx: No oropharyngeal exudate or posterior oropharyngeal erythema.  Neck:     Musculoskeletal: Neck supple. No muscular tenderness.     Thyroid: No thyromegaly.  Cardiovascular:     Rate and Rhythm: Normal rate and regular rhythm.  Pulmonary:     Effort: No respiratory distress.     Breath sounds: Normal breath sounds. No wheezing.  Abdominal:     General: Bowel sounds are normal.     Palpations: Abdomen is soft.     Tenderness: There is no abdominal tenderness.  Musculoskeletal:        General: No swelling or tenderness.  Lymphadenopathy:     Cervical: No cervical adenopathy.  Skin:    Findings: No erythema or rash.  Neurological:     Mental Status: She is alert.  Psychiatric:        Mood and Affect: Mood normal.        Behavior: Behavior normal.     BP 130/76   Pulse 78   Temp 97.8 F (36.6 C) (Oral)   Resp 16   Wt 150 lb 9.6 oz (68.3 kg)   SpO2 97%   BMI 25.85 kg/m  Wt Readings from Last 3 Encounters:  03/22/19 150 lb 9.6 oz (68.3 kg)  10/05/18 141 lb 3.2 oz (64 kg)  07/14/18 145 lb (65.8 kg)     Lab Results  Component Value Date   WBC 6.0 03/21/2019   HGB 13.6 03/21/2019   HCT 40.5 03/21/2019   PLT 278.0 03/21/2019   GLUCOSE 103 (H) 03/21/2019   CHOL 301 (H) 03/21/2019   TRIG 137.0 03/21/2019   HDL 62.80 03/21/2019   LDLDIRECT  190.4 08/30/2013   LDLCALC 210 (H) 03/21/2019   ALT 25 03/21/2019   AST 20 03/21/2019   NA 137 03/21/2019   K 4.9 03/21/2019   CL  101 03/21/2019   CREATININE 0.65 03/21/2019   BUN 22 03/21/2019   CO2 27 03/21/2019   TSH 1.34 03/21/2019   HGBA1C 6.4 03/21/2019   MICROALBUR 1.1 03/09/2015    Mm 3d Screen Breast Bilateral  Result Date: 01/06/2019 CLINICAL DATA:  Screening. EXAM: DIGITAL SCREENING BILATERAL MAMMOGRAM WITH TOMO AND CAD COMPARISON:  Previous exam(s). ACR Breast Density Category c: The breast tissue is heterogeneously dense, which may obscure small masses. FINDINGS: There are no findings suspicious for malignancy. Images were processed with CAD. IMPRESSION: No mammographic evidence of malignancy. A result letter of this screening mammogram will be mailed directly to the patient. RECOMMENDATION: Screening mammogram in one year. (Code:SM-B-01Y) BI-RADS CATEGORY  1: Negative. Electronically Signed   By: Fidela Salisbury M.D.   On: 01/06/2019 12:30       Assessment & Plan:   Problem List Items Addressed This Visit    Back pain    Has been doing a lot of physical activity.  Increased pushing, pulling, etc.  Desires no further w/up or evaluation.  Follow.        Relevant Medications   tizanidine (ZANAFLEX) 2 MG capsule   Daytime somnolence    Increased fatigue and daytime somnolence.  Not sleeping well.  Discussed possible sleep apnea.  Agrees for further evaluation.  Refer to pulmonary.        Relevant Orders   Ambulatory referral to Pulmonology   Difficulty sleeping    Has been on trazodone.  Discussed her problems sleeping.  Does not feel rested when wakes up in am.  Daytime somnolence and fatigue.  Discussed the possibility of sleep apnea.  Agreeable for further w/up.        Relevant Orders   Ambulatory referral to Pulmonology   Environmental allergies    nasacort nasal spray as directed.  Follow.        Hypercholesterolemia    Discussed her increased  cholesterol.  Discussed her concerns regarding possible intolerance to crestor.  Start mevacor.  Follow lipid panela dn liver function tests.  Low cholesterol diet and exercise.        Relevant Medications   lovastatin (MEVACOR) 20 MG tablet   Other Relevant Orders   Hepatic function panel   Hyperglycemia    Low carb diet and exercise.  Follow met b and a1c.        Hypertension    Blood pressure under good control.  Continue same medication regimen.  Follow pressures.  Follow metabolic panel.        Relevant Medications   lovastatin (MEVACOR) 20 MG tablet   Paget disease of bone    Saw endocrinology.  On miacalcin.        Stress    Increased stress.  Overall doing well.  Feels good.  On zoloft.  Does not feel needs any further intervention.  Follow.        Vitamin D deficiency    Follow vitamin D level.            Einar Pheasant, MD

## 2019-03-22 NOTE — Patient Instructions (Signed)
nasacort nasal spray - 2 sprays each nostril one time per day.  Do this in the evening.   

## 2019-03-26 ENCOUNTER — Encounter: Payer: Self-pay | Admitting: Internal Medicine

## 2019-03-26 DIAGNOSIS — M549 Dorsalgia, unspecified: Secondary | ICD-10-CM | POA: Insufficient documentation

## 2019-03-26 DIAGNOSIS — R400 Somnolence: Secondary | ICD-10-CM | POA: Insufficient documentation

## 2019-03-26 DIAGNOSIS — Z9109 Other allergy status, other than to drugs and biological substances: Secondary | ICD-10-CM | POA: Insufficient documentation

## 2019-03-26 NOTE — Assessment & Plan Note (Signed)
nasacort nasal spray as directed.  Follow.  

## 2019-03-26 NOTE — Assessment & Plan Note (Signed)
Blood pressure under good control.  Continue same medication regimen.  Follow pressures.  Follow metabolic panel.   

## 2019-03-26 NOTE — Assessment & Plan Note (Signed)
Increased stress.  Overall doing well.  Feels good.  On zoloft.  Does not feel needs any further intervention.  Follow.

## 2019-03-26 NOTE — Assessment & Plan Note (Signed)
Follow vitamin D level.  

## 2019-03-26 NOTE — Assessment & Plan Note (Signed)
Increased fatigue and daytime somnolence.  Not sleeping well.  Discussed possible sleep apnea.  Agrees for further evaluation.  Refer to pulmonary.

## 2019-03-26 NOTE — Assessment & Plan Note (Signed)
Saw endocrinology.  On miacalcin.

## 2019-03-26 NOTE — Assessment & Plan Note (Signed)
Has been on trazodone.  Discussed her problems sleeping.  Does not feel rested when wakes up in am.  Daytime somnolence and fatigue.  Discussed the possibility of sleep apnea.  Agreeable for further w/up.

## 2019-03-26 NOTE — Assessment & Plan Note (Signed)
Low carb diet and exercise.  Follow met b and a1c.   

## 2019-03-26 NOTE — Assessment & Plan Note (Signed)
Discussed her increased cholesterol.  Discussed her concerns regarding possible intolerance to crestor.  Start mevacor.  Follow lipid panela dn liver function tests.  Low cholesterol diet and exercise.

## 2019-03-26 NOTE — Assessment & Plan Note (Signed)
Has been doing a lot of physical activity.  Increased pushing, pulling, etc.  Desires no further w/up or evaluation.  Follow.

## 2019-04-18 ENCOUNTER — Ambulatory Visit
Admission: RE | Admit: 2019-04-18 | Payer: Managed Care, Other (non HMO) | Source: Home / Self Care | Admitting: Gastroenterology

## 2019-04-18 ENCOUNTER — Encounter: Admission: RE | Payer: Self-pay | Source: Home / Self Care

## 2019-04-18 SURGERY — COLONOSCOPY WITH PROPOFOL
Anesthesia: General

## 2019-04-28 ENCOUNTER — Encounter: Payer: Self-pay | Admitting: Internal Medicine

## 2019-05-03 ENCOUNTER — Other Ambulatory Visit (INDEPENDENT_AMBULATORY_CARE_PROVIDER_SITE_OTHER): Payer: Self-pay

## 2019-05-03 ENCOUNTER — Encounter: Payer: Self-pay | Admitting: Internal Medicine

## 2019-05-03 ENCOUNTER — Other Ambulatory Visit: Payer: Self-pay

## 2019-05-03 DIAGNOSIS — E7800 Pure hypercholesterolemia, unspecified: Secondary | ICD-10-CM

## 2019-05-03 LAB — HEPATIC FUNCTION PANEL
ALT: 23 U/L (ref 0–35)
AST: 19 U/L (ref 0–37)
Albumin: 4.7 g/dL (ref 3.5–5.2)
Alkaline Phosphatase: 54 U/L (ref 39–117)
Bilirubin, Direct: 0.1 mg/dL (ref 0.0–0.3)
Total Bilirubin: 0.5 mg/dL (ref 0.2–1.2)
Total Protein: 6.8 g/dL (ref 6.0–8.3)

## 2019-05-23 ENCOUNTER — Other Ambulatory Visit: Payer: Self-pay | Admitting: Internal Medicine

## 2019-06-22 DIAGNOSIS — M4608 Spinal enthesopathy, sacral and sacrococcygeal region: Secondary | ICD-10-CM | POA: Diagnosis not present

## 2019-06-22 DIAGNOSIS — M5137 Other intervertebral disc degeneration, lumbosacral region: Secondary | ICD-10-CM | POA: Diagnosis not present

## 2019-06-22 DIAGNOSIS — M4607 Spinal enthesopathy, lumbosacral region: Secondary | ICD-10-CM | POA: Diagnosis not present

## 2019-06-22 DIAGNOSIS — M9903 Segmental and somatic dysfunction of lumbar region: Secondary | ICD-10-CM | POA: Diagnosis not present

## 2019-06-23 DIAGNOSIS — M4608 Spinal enthesopathy, sacral and sacrococcygeal region: Secondary | ICD-10-CM | POA: Diagnosis not present

## 2019-06-23 DIAGNOSIS — M5137 Other intervertebral disc degeneration, lumbosacral region: Secondary | ICD-10-CM | POA: Diagnosis not present

## 2019-06-23 DIAGNOSIS — M4607 Spinal enthesopathy, lumbosacral region: Secondary | ICD-10-CM | POA: Diagnosis not present

## 2019-06-23 DIAGNOSIS — M9903 Segmental and somatic dysfunction of lumbar region: Secondary | ICD-10-CM | POA: Diagnosis not present

## 2019-07-05 ENCOUNTER — Institutional Professional Consult (permissible substitution): Payer: Managed Care, Other (non HMO) | Admitting: Internal Medicine

## 2019-07-05 ENCOUNTER — Institutional Professional Consult (permissible substitution): Payer: Managed Care, Other (non HMO) | Admitting: Pulmonary Disease

## 2019-07-05 ENCOUNTER — Other Ambulatory Visit: Payer: Self-pay | Admitting: Internal Medicine

## 2019-07-19 DIAGNOSIS — M5137 Other intervertebral disc degeneration, lumbosacral region: Secondary | ICD-10-CM | POA: Diagnosis not present

## 2019-07-19 DIAGNOSIS — M4607 Spinal enthesopathy, lumbosacral region: Secondary | ICD-10-CM | POA: Diagnosis not present

## 2019-07-19 DIAGNOSIS — M9903 Segmental and somatic dysfunction of lumbar region: Secondary | ICD-10-CM | POA: Diagnosis not present

## 2019-07-19 DIAGNOSIS — M4608 Spinal enthesopathy, sacral and sacrococcygeal region: Secondary | ICD-10-CM | POA: Diagnosis not present

## 2019-07-27 DIAGNOSIS — M4607 Spinal enthesopathy, lumbosacral region: Secondary | ICD-10-CM | POA: Diagnosis not present

## 2019-07-27 DIAGNOSIS — M9903 Segmental and somatic dysfunction of lumbar region: Secondary | ICD-10-CM | POA: Diagnosis not present

## 2019-07-27 DIAGNOSIS — M4608 Spinal enthesopathy, sacral and sacrococcygeal region: Secondary | ICD-10-CM | POA: Diagnosis not present

## 2019-07-27 DIAGNOSIS — M5137 Other intervertebral disc degeneration, lumbosacral region: Secondary | ICD-10-CM | POA: Diagnosis not present

## 2019-08-01 ENCOUNTER — Telehealth: Payer: Self-pay | Admitting: *Deleted

## 2019-08-01 DIAGNOSIS — E7800 Pure hypercholesterolemia, unspecified: Secondary | ICD-10-CM

## 2019-08-01 DIAGNOSIS — R739 Hyperglycemia, unspecified: Secondary | ICD-10-CM

## 2019-08-01 DIAGNOSIS — I10 Essential (primary) hypertension: Secondary | ICD-10-CM

## 2019-08-01 NOTE — Telephone Encounter (Signed)
Please place future orders for lab appt.  

## 2019-08-02 ENCOUNTER — Other Ambulatory Visit (INDEPENDENT_AMBULATORY_CARE_PROVIDER_SITE_OTHER): Payer: Self-pay

## 2019-08-02 ENCOUNTER — Other Ambulatory Visit: Payer: Self-pay

## 2019-08-02 DIAGNOSIS — I10 Essential (primary) hypertension: Secondary | ICD-10-CM

## 2019-08-02 DIAGNOSIS — E7800 Pure hypercholesterolemia, unspecified: Secondary | ICD-10-CM

## 2019-08-02 DIAGNOSIS — R739 Hyperglycemia, unspecified: Secondary | ICD-10-CM

## 2019-08-02 LAB — BASIC METABOLIC PANEL
BUN: 24 mg/dL — ABNORMAL HIGH (ref 6–23)
CO2: 28 mEq/L (ref 19–32)
Calcium: 9.7 mg/dL (ref 8.4–10.5)
Chloride: 100 mEq/L (ref 96–112)
Creatinine, Ser: 0.7 mg/dL (ref 0.40–1.20)
GFR: 85.44 mL/min (ref >60.00)
Glucose, Bld: 117 mg/dL — ABNORMAL HIGH (ref 70–99)
Potassium: 3.9 mEq/L (ref 3.5–5.1)
Sodium: 137 mEq/L (ref 135–145)

## 2019-08-02 LAB — LIPID PANEL
Cholesterol: 218 mg/dL — ABNORMAL HIGH (ref 0–200)
HDL: 62.6 mg/dL (ref >39.00)
LDL Cholesterol: 121 mg/dL — ABNORMAL HIGH (ref 0–99)
NonHDL: 155.6
Total CHOL/HDL Ratio: 3
Triglycerides: 171 mg/dL — ABNORMAL HIGH (ref 0.0–149.0)
VLDL: 34.2 mg/dL (ref 0.0–40.0)

## 2019-08-02 LAB — HEPATIC FUNCTION PANEL
ALT: 21 U/L (ref 0–35)
AST: 17 U/L (ref 0–37)
Albumin: 4.9 g/dL (ref 3.5–5.2)
Alkaline Phosphatase: 60 U/L (ref 39–117)
Bilirubin, Direct: 0.1 mg/dL (ref 0.0–0.3)
Total Bilirubin: 0.5 mg/dL (ref 0.2–1.2)
Total Protein: 6.8 g/dL (ref 6.0–8.3)

## 2019-08-02 LAB — HEMOGLOBIN A1C: Hgb A1c MFr Bld: 6.5 % (ref 4.6–6.5)

## 2019-08-02 NOTE — Telephone Encounter (Signed)
Orders placed for f/u labs.  

## 2019-08-03 ENCOUNTER — Encounter: Payer: Self-pay | Admitting: Internal Medicine

## 2019-08-05 ENCOUNTER — Other Ambulatory Visit: Payer: Self-pay

## 2019-08-09 ENCOUNTER — Encounter: Payer: Self-pay | Admitting: Internal Medicine

## 2019-08-09 ENCOUNTER — Other Ambulatory Visit: Payer: Self-pay

## 2019-08-09 ENCOUNTER — Ambulatory Visit (INDEPENDENT_AMBULATORY_CARE_PROVIDER_SITE_OTHER): Payer: Self-pay | Admitting: Internal Medicine

## 2019-08-09 VITALS — BP 138/78 | HR 78 | Temp 98.2°F | Resp 16 | Wt 147.8 lb

## 2019-08-09 DIAGNOSIS — M545 Low back pain, unspecified: Secondary | ICD-10-CM

## 2019-08-09 DIAGNOSIS — E7800 Pure hypercholesterolemia, unspecified: Secondary | ICD-10-CM

## 2019-08-09 DIAGNOSIS — M889 Osteitis deformans of unspecified bone: Secondary | ICD-10-CM

## 2019-08-09 DIAGNOSIS — R739 Hyperglycemia, unspecified: Secondary | ICD-10-CM

## 2019-08-09 DIAGNOSIS — Z0000 Encounter for general adult medical examination without abnormal findings: Secondary | ICD-10-CM

## 2019-08-09 DIAGNOSIS — I10 Essential (primary) hypertension: Secondary | ICD-10-CM

## 2019-08-09 DIAGNOSIS — Z9109 Other allergy status, other than to drugs and biological substances: Secondary | ICD-10-CM

## 2019-08-09 DIAGNOSIS — E559 Vitamin D deficiency, unspecified: Secondary | ICD-10-CM

## 2019-08-09 DIAGNOSIS — F439 Reaction to severe stress, unspecified: Secondary | ICD-10-CM

## 2019-08-09 MED ORDER — AMLODIPINE BESYLATE 5 MG PO TABS: 5.0000 mg | ORAL_TABLET | Freq: Every day | ORAL | 5 refills | Status: DC

## 2019-08-09 MED ORDER — HYDROCHLOROTHIAZIDE 25 MG PO TABS: 25.0000 mg | ORAL_TABLET | Freq: Every day | ORAL | 5 refills | Status: DC

## 2019-08-09 MED ORDER — TRAZODONE HCL 50 MG PO TABS: 50.0000 mg | ORAL_TABLET | Freq: Every day | ORAL | 5 refills | Status: DC

## 2019-08-09 MED ORDER — DOXYCYCLINE HYCLATE 100 MG PO TABS: 100.0000 mg | ORAL_TABLET | Freq: Two times a day (BID) | ORAL | 0 refills | Status: DC

## 2019-08-09 MED ORDER — SERTRALINE HCL 50 MG PO TABS: 75.0000 mg | ORAL_TABLET | Freq: Every day | ORAL | 5 refills | Status: DC

## 2019-08-09 MED ORDER — LOVASTATIN 20 MG PO TABS: ORAL_TABLET | ORAL | 5 refills | Status: DC

## 2019-08-09 MED ORDER — LOSARTAN POTASSIUM 100 MG PO TABS: 100.0000 mg | ORAL_TABLET | Freq: Every day | ORAL | 5 refills | Status: DC

## 2019-08-09 NOTE — Assessment & Plan Note (Addendum)
Physical today 08/08/19.  PAP 08/08/19.  Mammogram 01/06/19 - Birads I.  Referred to GI last visit for colonoscopy.

## 2019-08-09 NOTE — Progress Notes (Signed)
Patient ID: Rachel Collins, female   DOB: Oct 31, 1961, 58 y.o.   MRN: [id number]   Subjective:    Patient ID: Rachel Collins, female    DOB: 1961-09-23, 58 y.o.   MRN: [id number]  HPI  Patient here for her physical exam.  She reports she is doing relatively well.  Still with increased stress with her job, but she feels things are better and she feels she is handling things well.  Stays active.  No chest pain.  No sob.  No acid reflux.  No abdominal pain.  Bowels moving.  No urine change.  Back pain stable.  Pain better. Has been seeing chiropractor.  Helped.  On mevacor.  Tolerating.  Elects to remain on current dose of mevacor.     Past Medical History:  Diagnosis Date   Esophagitis    Gastritis    Gastroparesis    History of migraine headaches    Hypercholesterolemia    Hyperglycemia    Hypertension    Paget's bone disease    Past Surgical History:  Procedure Laterality Date   BREAST BIOPSY Left 2010   benign   CERVICAL DISC SURGERY     C5-C7 fusion   TUBAL LIGATION     Family History  Problem Relation Age of Onset   Heart disease Father        myocardial infarction x 5   Prostate cancer Father    Uterine cancer Mother    COPD Mother    Diabetes Mother    Heart disease Mother        myocardial infarction   Breast cancer Mother 76   Heart disease Paternal Grandmother    Heart disease Maternal Grandfather    Social History   Socioeconomic History   Marital status: Married    Spouse name: Not on file   Number of children: 1   Years of education: Not on file   Highest education level: Not on file  Occupational History   Not on file  Social Needs   Financial resource strain: Not on file   Food insecurity    Worry: Not on file    Inability: Not on file   Transportation needs    Medical: Not on file    Non-medical: Not on file  Tobacco Use   Smoking status: Never Smoker   Smokeless tobacco: Never Used  Substance and Sexual  Activity   Alcohol use: Yes    Alcohol/week: 7.0 standard drinks    Types: 7 Glasses of wine per week    Comment: 1 glass with dinner   Drug use: No   Sexual activity: Not on file  Lifestyle   Physical activity    Days per week: Not on file    Minutes per session: Not on file   Stress: Not on file  Relationships   Social connections    Talks on phone: Not on file    Gets together: Not on file    Attends religious service: Not on file    Active member of club or organization: Not on file    Attends meetings of clubs or organizations: Not on file    Relationship status: Not on file  Other Topics Concern   Not on file  Social History Narrative   Not on file    Outpatient Encounter Medications as of 08/09/2019  Medication Sig   amLODipine (NORVASC) 5 MG tablet Take 1 tablet (5 mg total) by mouth daily.   calcitonin, salmon, (MIACALCIN/FORTICAL)  200 UNIT/ACT nasal spray PLACE 1 SPRAY INTO ALTERNATE NOSTRILS DAILY.   doxycycline (VIBRA-TABS) 100 MG tablet Take 1 tablet (100 mg total) by mouth 2 (two) times daily.   hydrochlorothiazide (HYDRODIURIL) 25 MG tablet Take 1 tablet (25 mg total) by mouth daily.   losartan (COZAAR) 100 MG tablet Take 1 tablet (100 mg total) by mouth daily.   lovastatin (MEVACOR) 20 MG tablet TAKE 1 TABLET BY MOUTH EVERYDAY AT BEDTIME   sertraline (ZOLOFT) 50 MG tablet Take 1.5 tablets (75 mg total) by mouth daily.   traZODone (DESYREL) 50 MG tablet Take 1-2 tablets (50-100 mg total) by mouth at bedtime.   [DISCONTINUED] amLODipine (NORVASC) 5 MG tablet TAKE 1 TABLET BY MOUTH EVERY DAY   [DISCONTINUED] hydrochlorothiazide (HYDRODIURIL) 25 MG tablet TAKE 1 TABLET BY MOUTH EVERY DAY   [DISCONTINUED] losartan (COZAAR) 100 MG tablet TAKE 1 TABLET BY MOUTH EVERY DAY   [DISCONTINUED] lovastatin (MEVACOR) 20 MG tablet TAKE 1 TABLET BY MOUTH EVERYDAY AT BEDTIME   [DISCONTINUED] sertraline (ZOLOFT) 50 MG tablet TAKE 1.5 TABLETS (75 MG TOTAL) BY  MOUTH DAILY.   [DISCONTINUED] tizanidine (ZANAFLEX) 2 MG capsule Take 1 capsule (2 mg total) by mouth at bedtime as needed for muscle spasms.   [DISCONTINUED] traZODone (DESYREL) 50 MG tablet TAKE 1-2 TABLETS (50-100 MG TOTAL) BY MOUTH AT BEDTIME.   No facility-administered encounter medications on file as of 08/09/2019.     Review of Systems  Constitutional: Negative for appetite change and unexpected weight change.  HENT: Negative for congestion and sinus pressure.   Eyes: Negative for pain and visual disturbance.  Respiratory: Negative for cough, chest tightness and shortness of breath.   Cardiovascular: Negative for chest pain, palpitations and leg swelling.  Gastrointestinal: Negative for abdominal pain, diarrhea, nausea and vomiting.  Genitourinary: Negative for difficulty urinating and dysuria.  Musculoskeletal: Positive for back pain. Negative for joint swelling and myalgias.  Skin: Negative for color change and rash.  Neurological: Negative for dizziness, light-headedness and headaches.  Hematological: Negative for adenopathy. Does not bruise/bleed easily.  Psychiatric/Behavioral: Negative for agitation and dysphoric mood.       Objective:    Physical Exam Constitutional:      General: She is not in acute distress.    Appearance: Normal appearance. She is well-developed.  HENT:     Right Ear: External ear normal.     Left Ear: External ear normal.  Eyes:     General: No scleral icterus.       Right eye: No discharge.        Left eye: No discharge.     Conjunctiva/sclera: Conjunctivae normal.  Neck:     Musculoskeletal: Neck supple. No muscular tenderness.     Thyroid: No thyromegaly.  Cardiovascular:     Rate and Rhythm: Normal rate and regular rhythm.  Pulmonary:     Effort: No tachypnea, accessory muscle usage or respiratory distress.     Breath sounds: Normal breath sounds. No decreased breath sounds or wheezing.  Chest:     Breasts:        Right: No  inverted nipple, mass, nipple discharge or tenderness (no axillary adenopathy).        Left: No inverted nipple, mass, nipple discharge or tenderness (no axilarry adenopathy).  Abdominal:     General: Bowel sounds are normal.     Palpations: Abdomen is soft.     Tenderness: There is no abdominal tenderness.  Musculoskeletal:        General:  No swelling or tenderness.  Lymphadenopathy:     Cervical: No cervical adenopathy.  Skin:    Findings: No erythema or rash.  Neurological:     Mental Status: She is alert and oriented to person, place, and time.  Psychiatric:        Mood and Affect: Mood normal.        Behavior: Behavior normal.     BP 138/78    Pulse 78    Temp 98.2 F (36.8 C) (Temporal)    Resp 16    Wt 147 lb 12.8 oz (67 kg)    SpO2 98%    BMI 25.37 kg/m  Wt Readings from Last 3 Encounters:  08/09/19 147 lb 12.8 oz (67 kg)  03/22/19 150 lb 9.6 oz (68.3 kg)  10/05/18 141 lb 3.2 oz (64 kg)     Lab Results  Component Value Date   WBC 6.0 03/21/2019   HGB 13.6 03/21/2019   HCT 40.5 03/21/2019   PLT 278.0 03/21/2019   GLUCOSE 117 (H) 08/02/2019   CHOL 218 (H) 08/02/2019   TRIG 171.0 (H) 08/02/2019   HDL 62.60 08/02/2019   LDLDIRECT 190.4 08/30/2013   LDLCALC 121 (H) 08/02/2019   ALT 21 08/02/2019   AST 17 08/02/2019   NA 137 08/02/2019   K 3.9 08/02/2019   CL 100 08/02/2019   CREATININE 0.70 08/02/2019   BUN 24 (H) 08/02/2019   CO2 28 08/02/2019   TSH 1.34 03/21/2019   HGBA1C 6.5 08/02/2019   MICROALBUR 1.1 03/09/2015    Mm 3d Screen Breast Bilateral  Result Date: 01/06/2019 CLINICAL DATA:  Screening. EXAM: DIGITAL SCREENING BILATERAL MAMMOGRAM WITH TOMO AND CAD COMPARISON:  Previous exam(s). ACR Breast Density Category c: The breast tissue is heterogeneously dense, which may obscure small masses. FINDINGS: There are no findings suspicious for malignancy. Images were processed with CAD. IMPRESSION: No mammographic evidence of malignancy. A result letter of  this screening mammogram will be mailed directly to the patient. RECOMMENDATION: Screening mammogram in one year. (Code:SM-B-01Y) BI-RADS CATEGORY  1: Negative. Electronically Signed   By: Fidela Salisbury M.D.   On: 01/06/2019 12:30       Assessment & Plan:   Problem List Items Addressed This Visit    Back pain    Improved.  Seeing chiropractor.  Follow.        Environmental allergies    Controlled.        Health care maintenance    Physical today 08/08/19.  PAP 08/08/19.  Mammogram 01/06/19 - Birads I.  Referred to GI last visit for colonoscopy.        Hypercholesterolemia    On mevacor.  Cholesterol improved.  Low cholesterol diet and exercise.  Follow lipid panel and liver function tests.        Relevant Medications   amLODipine (NORVASC) 5 MG tablet   hydrochlorothiazide (HYDRODIURIL) 25 MG tablet   losartan (COZAAR) 100 MG tablet   lovastatin (MEVACOR) 20 MG tablet   Other Relevant Orders   Hepatic function panel   Lipid panel   Hyperglycemia    Low carb diet and exercise.  Follow met b and a1c.       Relevant Orders   Hemoglobin A1c   Hypertension    Blood pressure under good control.  Continue same medication regimen.  Follow pressures.  Follow metabolic panel.        Relevant Medications   amLODipine (NORVASC) 5 MG tablet   hydrochlorothiazide (HYDRODIURIL) 25 MG  tablet   losartan (COZAAR) 100 MG tablet   lovastatin (MEVACOR) 20 MG tablet   Other Relevant Orders   Basic metabolic panel   Paget disease of bone    On miacalcin. Followed by endocrinology.        Stress    Increased stress.  Discussed with her today.  On zoloft.  Doing well.  Follow.        Vitamin D deficiency    Follow vitamin D level.            Einar Pheasant, MD

## 2019-08-14 ENCOUNTER — Encounter: Payer: Self-pay | Admitting: Internal Medicine

## 2019-08-14 NOTE — Assessment & Plan Note (Signed)
Increased stress.  Discussed with her today.  On zoloft.  Doing well.  Follow.

## 2019-08-14 NOTE — Assessment & Plan Note (Signed)
Blood pressure under good control.  Continue same medication regimen.  Follow pressures.  Follow metabolic panel.   

## 2019-08-14 NOTE — Assessment & Plan Note (Signed)
On miacalcin. Followed by endocrinology.

## 2019-08-14 NOTE — Assessment & Plan Note (Signed)
Follow vitamin D level.  

## 2019-08-14 NOTE — Assessment & Plan Note (Signed)
Low carb diet and exercise.  Follow met b and a1c.  

## 2019-08-14 NOTE — Assessment & Plan Note (Signed)
Improved.  Seeing chiropractor.  Follow.

## 2019-08-14 NOTE — Assessment & Plan Note (Signed)
On mevacor.  Cholesterol improved.  Low cholesterol diet and exercise.  Follow lipid panel and liver function tests.

## 2019-08-14 NOTE — Assessment & Plan Note (Signed)
Controlled.  

## 2019-09-02 ENCOUNTER — Other Ambulatory Visit: Payer: Self-pay | Admitting: Internal Medicine

## 2019-09-28 DIAGNOSIS — M9903 Segmental and somatic dysfunction of lumbar region: Secondary | ICD-10-CM | POA: Diagnosis not present

## 2019-09-28 DIAGNOSIS — M4607 Spinal enthesopathy, lumbosacral region: Secondary | ICD-10-CM | POA: Diagnosis not present

## 2019-09-28 DIAGNOSIS — M4608 Spinal enthesopathy, sacral and sacrococcygeal region: Secondary | ICD-10-CM | POA: Diagnosis not present

## 2019-09-28 DIAGNOSIS — M5137 Other intervertebral disc degeneration, lumbosacral region: Secondary | ICD-10-CM | POA: Diagnosis not present

## 2019-09-29 DIAGNOSIS — M4607 Spinal enthesopathy, lumbosacral region: Secondary | ICD-10-CM | POA: Diagnosis not present

## 2019-09-29 DIAGNOSIS — M4608 Spinal enthesopathy, sacral and sacrococcygeal region: Secondary | ICD-10-CM | POA: Diagnosis not present

## 2019-09-29 DIAGNOSIS — M5137 Other intervertebral disc degeneration, lumbosacral region: Secondary | ICD-10-CM | POA: Diagnosis not present

## 2019-09-29 DIAGNOSIS — M9903 Segmental and somatic dysfunction of lumbar region: Secondary | ICD-10-CM | POA: Diagnosis not present

## 2019-10-04 ENCOUNTER — Other Ambulatory Visit: Payer: Self-pay

## 2019-10-04 ENCOUNTER — Ambulatory Visit (INDEPENDENT_AMBULATORY_CARE_PROVIDER_SITE_OTHER): Payer: 59

## 2019-10-04 DIAGNOSIS — M4607 Spinal enthesopathy, lumbosacral region: Secondary | ICD-10-CM | POA: Diagnosis not present

## 2019-10-04 DIAGNOSIS — Z23 Encounter for immunization: Secondary | ICD-10-CM | POA: Diagnosis not present

## 2019-10-04 DIAGNOSIS — M9903 Segmental and somatic dysfunction of lumbar region: Secondary | ICD-10-CM | POA: Diagnosis not present

## 2019-10-04 DIAGNOSIS — M5137 Other intervertebral disc degeneration, lumbosacral region: Secondary | ICD-10-CM | POA: Diagnosis not present

## 2019-10-04 DIAGNOSIS — M4608 Spinal enthesopathy, sacral and sacrococcygeal region: Secondary | ICD-10-CM | POA: Diagnosis not present

## 2019-10-05 DIAGNOSIS — M4608 Spinal enthesopathy, sacral and sacrococcygeal region: Secondary | ICD-10-CM | POA: Diagnosis not present

## 2019-10-05 DIAGNOSIS — M5137 Other intervertebral disc degeneration, lumbosacral region: Secondary | ICD-10-CM | POA: Diagnosis not present

## 2019-10-05 DIAGNOSIS — M4607 Spinal enthesopathy, lumbosacral region: Secondary | ICD-10-CM | POA: Diagnosis not present

## 2019-10-05 DIAGNOSIS — M9903 Segmental and somatic dysfunction of lumbar region: Secondary | ICD-10-CM | POA: Diagnosis not present

## 2019-10-17 ENCOUNTER — Other Ambulatory Visit: Payer: Self-pay | Admitting: Internal Medicine

## 2019-12-09 ENCOUNTER — Other Ambulatory Visit (INDEPENDENT_AMBULATORY_CARE_PROVIDER_SITE_OTHER): Payer: 59

## 2019-12-09 ENCOUNTER — Other Ambulatory Visit: Payer: Self-pay

## 2019-12-09 DIAGNOSIS — R739 Hyperglycemia, unspecified: Secondary | ICD-10-CM | POA: Diagnosis not present

## 2019-12-09 DIAGNOSIS — E7800 Pure hypercholesterolemia, unspecified: Secondary | ICD-10-CM | POA: Diagnosis not present

## 2019-12-09 DIAGNOSIS — I10 Essential (primary) hypertension: Secondary | ICD-10-CM | POA: Diagnosis not present

## 2019-12-09 LAB — HEPATIC FUNCTION PANEL
ALT: 20 U/L (ref 0–35)
AST: 17 U/L (ref 0–37)
Albumin: 4.8 g/dL (ref 3.5–5.2)
Alkaline Phosphatase: 61 U/L (ref 39–117)
Bilirubin, Direct: 0.1 mg/dL (ref 0.0–0.3)
Total Bilirubin: 0.4 mg/dL (ref 0.2–1.2)
Total Protein: 7.3 g/dL (ref 6.0–8.3)

## 2019-12-09 LAB — LIPID PANEL
Cholesterol: 226 mg/dL — ABNORMAL HIGH (ref 0–200)
HDL: 63.9 mg/dL (ref >39.00)
LDL Cholesterol: 130 mg/dL — ABNORMAL HIGH (ref 0–99)
NonHDL: 162.02
Total CHOL/HDL Ratio: 4
Triglycerides: 161 mg/dL — ABNORMAL HIGH (ref 0.0–149.0)
VLDL: 32.2 mg/dL (ref 0.0–40.0)

## 2019-12-09 LAB — BASIC METABOLIC PANEL
BUN: 19 mg/dL (ref 6–23)
CO2: 29 mEq/L (ref 19–32)
Calcium: 9.7 mg/dL (ref 8.4–10.5)
Chloride: 102 mEq/L (ref 96–112)
Creatinine, Ser: 0.78 mg/dL (ref 0.40–1.20)
GFR: 75.32 mL/min (ref >60.00)
Glucose, Bld: 104 mg/dL — ABNORMAL HIGH (ref 70–99)
Potassium: 3.9 mEq/L (ref 3.5–5.1)
Sodium: 138 mEq/L (ref 135–145)

## 2019-12-09 LAB — HEMOGLOBIN A1C: Hgb A1c MFr Bld: 6.3 % (ref 4.6–6.5)

## 2019-12-13 ENCOUNTER — Encounter: Payer: Self-pay | Admitting: Internal Medicine

## 2019-12-13 ENCOUNTER — Other Ambulatory Visit: Payer: Self-pay

## 2019-12-13 ENCOUNTER — Telehealth: Payer: Self-pay | Admitting: Internal Medicine

## 2019-12-13 ENCOUNTER — Ambulatory Visit (INDEPENDENT_AMBULATORY_CARE_PROVIDER_SITE_OTHER): Payer: 59 | Admitting: Internal Medicine

## 2019-12-13 DIAGNOSIS — I10 Essential (primary) hypertension: Secondary | ICD-10-CM | POA: Diagnosis not present

## 2019-12-13 DIAGNOSIS — Z20828 Contact with and (suspected) exposure to other viral communicable diseases: Secondary | ICD-10-CM | POA: Diagnosis not present

## 2019-12-13 DIAGNOSIS — R0981 Nasal congestion: Secondary | ICD-10-CM | POA: Diagnosis not present

## 2019-12-13 DIAGNOSIS — R739 Hyperglycemia, unspecified: Secondary | ICD-10-CM

## 2019-12-13 DIAGNOSIS — R69 Illness, unspecified: Secondary | ICD-10-CM | POA: Diagnosis not present

## 2019-12-13 DIAGNOSIS — E7800 Pure hypercholesterolemia, unspecified: Secondary | ICD-10-CM

## 2019-12-13 DIAGNOSIS — M889 Osteitis deformans of unspecified bone: Secondary | ICD-10-CM

## 2019-12-13 DIAGNOSIS — F439 Reaction to severe stress, unspecified: Secondary | ICD-10-CM

## 2019-12-13 MED ORDER — AZITHROMYCIN 250 MG PO TABS: ORAL_TABLET | ORAL | 0 refills | Status: DC

## 2019-12-13 NOTE — Telephone Encounter (Signed)
My chart message sent to pt for update.   

## 2019-12-13 NOTE — Progress Notes (Signed)
Patient ID: Rachel Collins, female   DOB: 20-Nov-1961, 58 y.o.   MRN: [id number]   Virtual Visit via video Note  This visit type was conducted due to national recommendations for restrictions regarding the COVID-19 pandemic (e.g. social distancing).  This format is felt to be most appropriate for this patient at this time.  All issues noted in this document were discussed and addressed.  No physical exam was performed (except for noted visual exam findings with Video Visits).   I connected with Tamy Accardo by a video enabled telemedicine application and verified that I am speaking with the correct person using two identifiers. Location patient: home Location provider: work Persons participating in the virtual visit: patient, provider  I discussed the limitations, risks, security and privacy concerns of performing an evaluation and management service by video and the availability of in person appointments. The patient expressed understanding and agreed to proceed.   Reason for visit: scheduled follow up.    HPI: She reports she has noticed over the last few days, increased drainage, cough, sore throat and sinus pressure.  Was at ER with her husband over the weekend.  Symptoms started after that - 12/10/19.  Reports increased drainage and some irritation in her throat.  Did cough up colored mucus, but feels this is coming from the drainage.  No chest congestion.  No sob.  No chest pain.  No loss of taste or smell.  No vomiting or diarrhea reported.  Has been taking mucinex and alka seltzer plus.  Also used afrin.  Does feel some better today.  No fever.  Blood pressure elevated this am.  Has been 120s/70s.  Discussed labs.     ROS: See pertinent positives and negatives per HPI.  Past Medical History:  Diagnosis Date  . Esophagitis   . Gastritis   . Gastroparesis   . History of migraine headaches   . Hypercholesterolemia   . Hyperglycemia   . Hypertension   . Paget's bone disease       Past Surgical History:  Procedure Laterality Date  . BREAST BIOPSY Left 2010   benign  . CERVICAL DISC SURGERY     C5-C7 fusion  . TUBAL LIGATION      Family History  Problem Relation Age of Onset  . Heart disease Father        myocardial infarction x 5  . Prostate cancer Father   . Uterine cancer Mother   . COPD Mother   . Diabetes Mother   . Heart disease Mother        myocardial infarction  . Breast cancer Mother 61  . Heart disease Paternal Grandmother   . Heart disease Maternal Grandfather     SOCIAL HX: reviewed.    Current Outpatient Medications:  .  amLODipine (NORVASC) 5 MG tablet, Take 1 tablet (5 mg total) by mouth daily., Disp: 30 tablet, Rfl: 5 .  hydrochlorothiazide (HYDRODIURIL) 25 MG tablet, Take 1 tablet (25 mg total) by mouth daily., Disp: 30 tablet, Rfl: 5 .  losartan (COZAAR) 100 MG tablet, TAKE 1 TABLET BY MOUTH EVERY DAY, Disp: 30 tablet, Rfl: 0 .  lovastatin (MEVACOR) 20 MG tablet, TAKE 1 TABLET BY MOUTH EVERYDAY AT BEDTIME, Disp: 30 tablet, Rfl: 5 .  sertraline (ZOLOFT) 50 MG tablet, Take 1.5 tablets (75 mg total) by mouth daily., Disp: 45 tablet, Rfl: 5 .  azithromycin (ZITHROMAX) 250 MG tablet, Take 2 tablets x 1 day and then 1 tablet per day for four  more days., Disp: 6 tablet, Rfl: 0 .  traZODone (DESYREL) 50 MG tablet, TAKE 1-2 TABLETS (50-100 MG TOTAL) BY MOUTH AT BEDTIME., Disp: 60 tablet, Rfl: 2  EXAM:  VITALS per patient if applicable: 871/959  GENERAL: alert, oriented, appears well and in no acute distress  HEENT: atraumatic, conjunttiva clear, no obvious abnormalities on inspection of external nose and ears  NECK: normal movements of the head and neck  LUNGS: on inspection no signs of respiratory distress, breathing rate appears normal, no obvious gross SOB, gasping or wheezing  CV: no obvious cyanosis  PSYCH/NEURO: pleasant and cooperative, no obvious depression or anxiety, speech and thought processing grossly  intact  ASSESSMENT AND PLAN:  Discussed the following assessment and plan:  Hypercholesterolemia On mevacor.  Low cholesterol diet and exercise.  Follow lipid panel and liver function tests.   Lab Results  Component Value Date   CHOL 226 (H) 12/09/2019   HDL 63.90 12/09/2019   LDLCALC 130 (H) 12/09/2019   LDLDIRECT 190.4 08/30/2013   TRIG 161.0 (H) 12/09/2019   CHOLHDL 4 12/09/2019    Hyperglycemia Low carb diet and exercise.  Follow met b and a1c.   Lab Results  Component Value Date   HGBA1C 6.3 12/09/2019    Hypertension Blood pressure elevated.  States has not been this high previously.  Have her recheck this pm.  Send in readings.  Can make adjustment in her medication if persistent elevation.    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    Stress On zoloft.  Has trazodone to help her sleep.  Overall stable.  Follow.    Sinus congestion Saline nasal spray as directed.  Mucinex.  Stop alka seltzer.  rx givent for zpak to have if needed.  Follow.      I discussed the assessment and treatment plan with the patient. The patient was provided an opportunity to ask questions and all were answered. The patient agreed with the plan and demonstrated an understanding of the instructions.   The patient was advised to call back or seek an in-person evaluation if the symptoms worsen or if the condition fails to improve as anticipated.   Einar Pheasant, MD

## 2019-12-14 ENCOUNTER — Encounter: Payer: Self-pay | Admitting: Internal Medicine

## 2019-12-14 MED ORDER — AZITHROMYCIN 250 MG PO TABS: ORAL_TABLET | ORAL | 0 refills | Status: DC

## 2019-12-14 NOTE — Telephone Encounter (Signed)
rx sent in for zpak.   

## 2019-12-18 ENCOUNTER — Encounter: Payer: Self-pay | Admitting: Internal Medicine

## 2019-12-18 DIAGNOSIS — R0981 Nasal congestion: Secondary | ICD-10-CM | POA: Insufficient documentation

## 2019-12-18 NOTE — Assessment & Plan Note (Signed)
On zoloft.  Has trazodone to help her sleep.  Overall stable.  Follow.

## 2019-12-18 NOTE — Assessment & Plan Note (Signed)
Evaluated by endocrinology.

## 2019-12-18 NOTE — Assessment & Plan Note (Signed)
Low carb diet and exercise.  Follow met b and a1c.   Lab Results  Component Value Date   HGBA1C 6.3 12/09/2019

## 2019-12-18 NOTE — Assessment & Plan Note (Signed)
Blood pressure elevated.  States has not been this high previously.  Have her recheck this pm.  Send in readings.  Can make adjustment in her medication if persistent elevation.

## 2019-12-18 NOTE — Assessment & Plan Note (Signed)
On mevacor.  Low cholesterol diet and exercise.  Follow lipid panel and liver function tests.   Lab Results  Component Value Date   CHOL 226 (H) 12/09/2019   HDL 63.90 12/09/2019   LDLCALC 130 (H) 12/09/2019   LDLDIRECT 190.4 08/30/2013   TRIG 161.0 (H) 12/09/2019   CHOLHDL 4 12/09/2019

## 2019-12-18 NOTE — Assessment & Plan Note (Signed)
Saline nasal spray as directed.  Mucinex.  Stop alka seltzer.  rx givent for zpak to have if needed.  Follow.

## 2019-12-27 ENCOUNTER — Other Ambulatory Visit: Payer: Self-pay | Admitting: Internal Medicine

## 2020-02-27 ENCOUNTER — Ambulatory Visit: Payer: 59 | Attending: Internal Medicine

## 2020-02-27 DIAGNOSIS — Z23 Encounter for immunization: Secondary | ICD-10-CM

## 2020-02-27 NOTE — Progress Notes (Signed)
   Covid-19 Vaccination Clinic  Name:  SMT LOKEY    MRN: [id number] DOB: 1961-04-26  02/27/2020  Ms. Cayton was observed post Covid-19 immunization for 15 minutes without incident. She was provided with Vaccine Information Sheet and instruction to access the V-Safe system.   Ms. Whitcher was instructed to call 911 with any severe reactions post vaccine: Marland Kitchen Difficulty breathing  . Swelling of face and throat  . A fast heartbeat  . A bad rash all over body  . Dizziness and weakness   Immunizations Administered    Name Date Dose VIS Date Route   Pfizer COVID-19 Vaccine 02/27/2020 10:07 AM 0.3 mL 12/02/2019 Intramuscular   Manufacturer: [employer], [employer]   Lot: [id number]   NDC: [address]-0

## 2020-02-29 ENCOUNTER — Other Ambulatory Visit: Payer: Self-pay | Admitting: Internal Medicine

## 2020-03-08 ENCOUNTER — Encounter: Payer: Self-pay | Admitting: Internal Medicine

## 2020-03-09 ENCOUNTER — Other Ambulatory Visit: Payer: Self-pay

## 2020-03-09 MED ORDER — HYDROCHLOROTHIAZIDE 25 MG PO TABS: 25.0000 mg | ORAL_TABLET | Freq: Every day | ORAL | 5 refills | Status: DC

## 2020-03-21 ENCOUNTER — Other Ambulatory Visit: Payer: Self-pay

## 2020-03-21 ENCOUNTER — Ambulatory Visit: Payer: 59 | Attending: Internal Medicine

## 2020-03-21 DIAGNOSIS — Z23 Encounter for immunization: Secondary | ICD-10-CM

## 2020-03-21 NOTE — Progress Notes (Signed)
   Covid-19 Vaccination Clinic  Name:  Rachel Collins    MRN: [id number] DOB: Apr 19, 1961  03/21/2020  [name] was observed post Covid-19 immunization for 15 minutes without incident. She was provided with Vaccine Information Sheet and instruction to access the V-Safe system.   [name] was instructed to call 911 with any severe reactions post vaccine: Marland Kitchen Difficulty breathing  . Swelling of face and throat  . A fast heartbeat  . A bad rash all over body  . Dizziness and weakness   Immunizations Administered    Name Date Dose VIS Date Route   Pfizer COVID-19 Vaccine 03/21/2020  9:16 AM 0.3 mL 12/02/2019 Intramuscular   Manufacturer: [employer], [employer]   Lot: [id number]   NDC: [address]-8

## 2020-04-01 ENCOUNTER — Other Ambulatory Visit: Payer: Self-pay | Admitting: Internal Medicine

## 2020-04-09 ENCOUNTER — Encounter: Payer: Self-pay | Admitting: Internal Medicine

## 2020-04-17 ENCOUNTER — Other Ambulatory Visit: Payer: 59

## 2020-04-20 ENCOUNTER — Ambulatory Visit: Payer: 59 | Admitting: Internal Medicine

## 2020-04-23 ENCOUNTER — Other Ambulatory Visit: Payer: Self-pay | Admitting: Internal Medicine

## 2020-04-25 ENCOUNTER — Other Ambulatory Visit: Payer: Self-pay | Admitting: Internal Medicine

## 2020-05-21 ENCOUNTER — Other Ambulatory Visit: Payer: Self-pay | Admitting: Internal Medicine

## 2020-05-23 ENCOUNTER — Other Ambulatory Visit: Payer: Self-pay

## 2020-05-23 ENCOUNTER — Other Ambulatory Visit (INDEPENDENT_AMBULATORY_CARE_PROVIDER_SITE_OTHER): Payer: 59

## 2020-05-23 DIAGNOSIS — I10 Essential (primary) hypertension: Secondary | ICD-10-CM

## 2020-05-23 DIAGNOSIS — E7800 Pure hypercholesterolemia, unspecified: Secondary | ICD-10-CM

## 2020-05-23 DIAGNOSIS — R739 Hyperglycemia, unspecified: Secondary | ICD-10-CM | POA: Diagnosis not present

## 2020-05-23 LAB — HEPATIC FUNCTION PANEL
ALT: 28 U/L (ref 0–35)
AST: 24 U/L (ref 0–37)
Albumin: 4.8 g/dL (ref 3.5–5.2)
Alkaline Phosphatase: 56 U/L (ref 39–117)
Bilirubin, Direct: 0.1 mg/dL (ref 0.0–0.3)
Total Bilirubin: 0.3 mg/dL (ref 0.2–1.2)
Total Protein: 7.2 g/dL (ref 6.0–8.3)

## 2020-05-23 LAB — CBC WITH DIFFERENTIAL/PLATELET
Basophils Absolute: 0.1 10*3/uL (ref 0.0–0.1)
Basophils Relative: 1.3 % (ref 0.0–3.0)
Eosinophils Absolute: 0.3 10*3/uL (ref 0.0–0.7)
Eosinophils Relative: 5.9 % — ABNORMAL HIGH (ref 0.0–5.0)
HCT: 42.2 % (ref 36.0–46.0)
Hemoglobin: 14.2 g/dL (ref 12.0–15.0)
Lymphocytes Relative: 24.3 % (ref 12.0–46.0)
Lymphs Abs: 1.3 10*3/uL (ref 0.7–4.0)
MCHC: 33.6 g/dL (ref 30.0–36.0)
MCV: 91.3 fl (ref 78.0–100.0)
Monocytes Absolute: 0.5 10*3/uL (ref 0.1–1.0)
Monocytes Relative: 9 % (ref 3.0–12.0)
Neutro Abs: 3.3 10*3/uL (ref 1.4–7.7)
Neutrophils Relative %: 59.5 % (ref 43.0–77.0)
Platelets: 269 10*3/uL (ref 150.0–400.0)
RBC: 4.62 Mil/uL (ref 3.87–5.11)
RDW: 13.5 % (ref 11.5–15.5)
WBC: 5.5 10*3/uL (ref 4.0–10.5)

## 2020-05-23 LAB — BASIC METABOLIC PANEL
BUN: 15 mg/dL (ref 6–23)
CO2: 29 mEq/L (ref 19–32)
Calcium: 9.7 mg/dL (ref 8.4–10.5)
Chloride: 100 mEq/L (ref 96–112)
Creatinine, Ser: 0.66 mg/dL (ref 0.40–1.20)
GFR: 91.19 mL/min (ref >60.00)
Glucose, Bld: 124 mg/dL — ABNORMAL HIGH (ref 70–99)
Potassium: 4.1 mEq/L (ref 3.5–5.1)
Sodium: 137 mEq/L (ref 135–145)

## 2020-05-23 LAB — LIPID PANEL
Cholesterol: 243 mg/dL — ABNORMAL HIGH (ref 0–200)
HDL: 56.4 mg/dL (ref >39.00)
LDL Cholesterol: 148 mg/dL — ABNORMAL HIGH (ref 0–99)
NonHDL: 186.89
Total CHOL/HDL Ratio: 4
Triglycerides: 192 mg/dL — ABNORMAL HIGH (ref 0.0–149.0)
VLDL: 38.4 mg/dL (ref 0.0–40.0)

## 2020-05-23 LAB — HEMOGLOBIN A1C: Hgb A1c MFr Bld: 6.4 % (ref 4.6–6.5)

## 2020-05-25 ENCOUNTER — Encounter: Payer: Self-pay | Admitting: Internal Medicine

## 2020-05-25 ENCOUNTER — Ambulatory Visit: Payer: 59 | Admitting: Internal Medicine

## 2020-06-01 ENCOUNTER — Encounter: Payer: Self-pay | Admitting: Internal Medicine

## 2020-06-01 ENCOUNTER — Ambulatory Visit: Payer: 59 | Admitting: Internal Medicine

## 2020-06-01 ENCOUNTER — Other Ambulatory Visit: Payer: Self-pay

## 2020-06-01 VITALS — BP 102/62 | HR 72 | Temp 97.8°F | Resp 16 | Ht 64.0 in | Wt 146.2 lb

## 2020-06-01 DIAGNOSIS — Z1211 Encounter for screening for malignant neoplasm of colon: Secondary | ICD-10-CM | POA: Diagnosis not present

## 2020-06-01 DIAGNOSIS — E7800 Pure hypercholesterolemia, unspecified: Secondary | ICD-10-CM

## 2020-06-01 DIAGNOSIS — R739 Hyperglycemia, unspecified: Secondary | ICD-10-CM

## 2020-06-01 DIAGNOSIS — R87810 Cervical high risk human papillomavirus (HPV) DNA test positive: Secondary | ICD-10-CM

## 2020-06-01 DIAGNOSIS — F439 Reaction to severe stress, unspecified: Secondary | ICD-10-CM

## 2020-06-01 DIAGNOSIS — I10 Essential (primary) hypertension: Secondary | ICD-10-CM

## 2020-06-01 DIAGNOSIS — E559 Vitamin D deficiency, unspecified: Secondary | ICD-10-CM

## 2020-06-01 DIAGNOSIS — M2550 Pain in unspecified joint: Secondary | ICD-10-CM

## 2020-06-01 DIAGNOSIS — R69 Illness, unspecified: Secondary | ICD-10-CM | POA: Diagnosis not present

## 2020-06-01 DIAGNOSIS — Z1231 Encounter for screening mammogram for malignant neoplasm of breast: Secondary | ICD-10-CM

## 2020-06-01 NOTE — Progress Notes (Signed)
Patient ID: Rachel Collins, female   DOB: 1961/09/17, 59 y.o.   MRN: [id number]   Subjective:    Patient ID: Rachel Collins, female    DOB: December 23, 1960, 59 y.o.   MRN: [id number]  HPI This visit occurred during the SARS-CoV-2 public health emergency.  Safety protocols were in place, including screening questions prior to the visit, additional usage of staff PPE, and extensive cleaning of exam room while observing appropriate contact time as indicated for disinfecting solutions.  Patient here for a scheduled follow up.  She reports she is doing relatively well.  Here to f/u regarding her blood sugar, cholesterol and blood pressure.  She had problems with plantar fasciitis. Saw chiropractor.  Also developed cramping and joint pain.  Saw chiropractor - 12 weeks. Improved.  Started to notice a little stiffness/worsening again.  Plans to f/u with chiropractor.  Increased stress at work.  Overall she feels she is handling things relatively well.  Does a lot of lifting and physical activity at work.  No chest pain or sob.  No acid reflux reported.  No abdominal pain or cramping reported.  Bowels stable.     Past Medical History:  Diagnosis Date  . Esophagitis   . Gastritis   . Gastroparesis   . History of migraine headaches   . Hypercholesterolemia   . Hyperglycemia   . Hypertension   . Paget's bone disease    Past Surgical History:  Procedure Laterality Date  . BREAST BIOPSY Left 2010   benign  . CERVICAL DISC SURGERY     C5-C7 fusion  . TUBAL LIGATION     Family History  Problem Relation Age of Onset  . Heart disease Father        myocardial infarction x 5  . Prostate cancer Father   . Uterine cancer Mother   . COPD Mother   . Diabetes Mother   . Heart disease Mother        myocardial infarction  . Breast cancer Mother 30  . Heart disease Paternal Grandmother   . Heart disease Maternal Grandfather    Social History   Socioeconomic History  . Marital status: Married    Spouse  name: Not on file  . Number of children: 1  . Years of education: Not on file  . Highest education level: Not on file  Occupational History  . Not on file  Tobacco Use  . Smoking status: Never Smoker  . Smokeless tobacco: Never Used  Vaping Use  . Vaping Use: Never used  Substance and Sexual Activity  . Alcohol use: Yes    Alcohol/week: 7.0 standard drinks    Types: 7 Glasses of wine per week    Comment: 1 glass with dinner  . Drug use: No  . Sexual activity: Not on file  Other Topics Concern  . Not on file  Social History Narrative  . Not on file   Social Determinants of Health   Financial Resource Strain:   . Difficulty of Paying Living Expenses:   Food Insecurity:   . Worried About Charity fundraiser in the Last Year:   . Arboriculturist in the Last Year:   Transportation Needs:   . Film/video editor (Medical):   Marland Kitchen Lack of Transportation (Non-Medical):   Physical Activity:   . Days of Exercise per Week:   . Minutes of Exercise per Session:   Stress:   . Feeling of Stress :   Social Connections:   .  Frequency of Communication with Friends and Family:   . Frequency of Social Gatherings with Friends and Family:   . Attends Religious Services:   . Active Member of Clubs or Organizations:   . Attends Banker Meetings:   Marland Kitchen Marital Status:     Outpatient Encounter Medications as of 06/01/2020  Medication Sig  . amLODipine (NORVASC) 5 MG tablet Take 1 tablet (5 mg total) by mouth daily.  Marland Kitchen azithromycin (ZITHROMAX) 250 MG tablet Take 2 tablets x 1 day and then 1 tablet per day for four more days.  . hydrochlorothiazide (HYDRODIURIL) 25 MG tablet Take 1 tablet (25 mg total) by mouth daily.  Marland Kitchen losartan (COZAAR) 100 MG tablet TAKE 1 TABLET BY MOUTH EVERY DAY  . lovastatin (MEVACOR) 20 MG tablet TAKE 1 TABLET BY MOUTH EVERYDAY AT BEDTIME  . sertraline (ZOLOFT) 50 MG tablet TAKE 1 AND 1/2 TABLETS BY MOUTH DAILY  . traZODone (DESYREL) 50 MG tablet TAKE 1-2  TABLETS BY MOUTH AT BEDTIME.   No facility-administered encounter medications on file as of 06/01/2020.    Review of Systems  Constitutional: Negative for appetite change and unexpected weight change.  HENT: Negative for congestion and sinus pressure.   Eyes: Negative for pain and visual disturbance.  Respiratory: Negative for cough, chest tightness and shortness of breath.   Cardiovascular: Negative for chest pain, palpitations and leg swelling.  Gastrointestinal: Negative for abdominal pain, diarrhea, nausea and vomiting.  Genitourinary: Negative for difficulty urinating and dysuria.  Musculoskeletal: Negative for joint swelling.       Reports joint pain and stiffness as outlined.    Skin: Negative for color change and rash.  Neurological: Negative for dizziness, light-headedness and headaches.  Hematological: Negative for adenopathy. Does not bruise/bleed easily.  Psychiatric/Behavioral: Negative for agitation and dysphoric mood.       Objective:    Physical Exam Vitals reviewed.  Constitutional:      General: She is not in acute distress.    Appearance: Normal appearance.  HENT:     Head: Normocephalic and atraumatic.     Right Ear: External ear normal.     Left Ear: External ear normal.  Eyes:     General: No scleral icterus.       Right eye: No discharge.        Left eye: No discharge.     Conjunctiva/sclera: Conjunctivae normal.  Neck:     Thyroid: No thyromegaly.  Cardiovascular:     Rate and Rhythm: Normal rate and regular rhythm.  Pulmonary:     Effort: No respiratory distress.     Breath sounds: Normal breath sounds. No wheezing.  Abdominal:     General: Bowel sounds are normal.     Palpations: Abdomen is soft.     Tenderness: There is no abdominal tenderness.  Musculoskeletal:        General: No swelling or tenderness.     Cervical back: Neck supple. No tenderness.  Lymphadenopathy:     Cervical: No cervical adenopathy.  Skin:    Findings: No erythema  or rash.  Neurological:     Mental Status: She is alert.  Psychiatric:        Mood and Affect: Mood normal.        Behavior: Behavior normal.     BP 102/62   Pulse 72   Temp 97.8 F (36.6 C)   Resp 16   Ht 5\' 4"  (1.626 m)   Wt 146 lb 3.2 oz (66.3  kg)   SpO2 99%   BMI 25.10 kg/m  Wt Readings from Last 3 Encounters:  06/01/20 146 lb 3.2 oz (66.3 kg)  12/13/19 147 lb (66.7 kg)  08/09/19 147 lb 12.8 oz (67 kg)     Lab Results  Component Value Date   WBC 5.5 05/23/2020   HGB 14.2 05/23/2020   HCT 42.2 05/23/2020   PLT 269.0 05/23/2020   GLUCOSE 124 (H) 05/23/2020   CHOL 243 (H) 05/23/2020   TRIG 192.0 (H) 05/23/2020   HDL 56.40 05/23/2020   LDLDIRECT 190.4 08/30/2013   LDLCALC 148 (H) 05/23/2020   ALT 28 05/23/2020   AST 24 05/23/2020   NA 137 05/23/2020   K 4.1 05/23/2020   CL 100 05/23/2020   CREATININE 0.66 05/23/2020   BUN 15 05/23/2020   CO2 29 05/23/2020   TSH 1.34 03/21/2019   HGBA1C 6.4 05/23/2020   MICROALBUR 1.1 03/09/2015    MM 3D SCREEN BREAST BILATERAL  Result Date: 01/06/2019 CLINICAL DATA:  Screening. EXAM: DIGITAL SCREENING BILATERAL MAMMOGRAM WITH TOMO AND CAD COMPARISON:  Previous exam(s). ACR Breast Density Category c: The breast tissue is heterogeneously dense, which may obscure small masses. FINDINGS: There are no findings suspicious for malignancy. Images were processed with CAD. IMPRESSION: No mammographic evidence of malignancy. A result letter of this screening mammogram will be mailed directly to the patient. RECOMMENDATION: Screening mammogram in one year. (Code:SM-B-01Y) BI-RADS CATEGORY  1: Negative. Electronically Signed   By: Fidela Salisbury M.D.   On: 01/06/2019 12:30       Assessment & Plan:   Problem List Items Addressed This Visit    Cervical high risk HPV (human papillomavirus) test positive    PAP 09/2017 - negative with negative HPV.       Colon cancer screening    Discussed.  She will call to schedule.         Hypercholesterolemia    Discussed recent labs.  Cholesterol elevated.  She has been off mevacor.  Problems with getting rx.  Has restarted.  Continue mevacor.  Low cholesterol diet and exercise.  Follow lipid panel and liver function tests.        Relevant Orders   Lipid panel   Hepatic function panel   Hyperglycemia    Low carb diet and exercise.  Follow met b and a1c.       Relevant Orders   Hemoglobin A1c   Hypertension    Blood pressure doing well.  Continue losartan, hctz and amlodipine.  Follow pressures.  Follow metabolic panel.        Relevant Orders   TSH   Basic metabolic panel   Joint pain    Joint pains and stiffness as outlined.  Previously saw rheumatology.  Plans to f/u with chiropractor.        Stress    On zoloft.  Trazodone to help with sleep.  Overall appears to be handling things relatively well.  Follow.        Vitamin D deficiency    Follow vitamin D level.        Other Visit Diagnoses    Encounter for screening mammogram for malignant neoplasm of breast    -  Primary   Relevant Orders   MM 3D SCREEN BREAST BILATERAL       Einar Pheasant, MD

## 2020-06-02 ENCOUNTER — Encounter: Payer: Self-pay | Admitting: Internal Medicine

## 2020-06-02 DIAGNOSIS — Z1211 Encounter for screening for malignant neoplasm of colon: Secondary | ICD-10-CM | POA: Insufficient documentation

## 2020-06-02 NOTE — Assessment & Plan Note (Signed)
Blood pressure doing well.  Continue losartan, hctz and amlodipine.  Follow pressures. Follow metabolic panel.  

## 2020-06-02 NOTE — Assessment & Plan Note (Signed)
On zoloft.  Trazodone to help with sleep.  Overall appears to be handling things relatively well.  Follow.

## 2020-06-02 NOTE — Addendum Note (Signed)
Addended by: Charm Barges on: 06/02/2020 04:55 PM   Modules accepted: Level of Service

## 2020-06-02 NOTE — Assessment & Plan Note (Signed)
Low carb diet and exercise.  Follow met b and a1c.  

## 2020-06-02 NOTE — Assessment & Plan Note (Signed)
Follow vitamin D level.  

## 2020-06-02 NOTE — Assessment & Plan Note (Signed)
Discussed.  She will call to schedule.

## 2020-06-02 NOTE — Assessment & Plan Note (Signed)
Discussed recent labs.  Cholesterol elevated.  She has been off mevacor.  Problems with getting rx.  Has restarted.  Continue mevacor.  Low cholesterol diet and exercise.  Follow lipid panel and liver function tests.

## 2020-06-02 NOTE — Assessment & Plan Note (Signed)
Joint pains and stiffness as outlined.  Previously saw rheumatology.  Plans to f/u with chiropractor.

## 2020-06-02 NOTE — Assessment & Plan Note (Signed)
PAP 09/2017 - negative with negative HPV.

## 2020-06-18 ENCOUNTER — Other Ambulatory Visit: Payer: Self-pay | Admitting: Internal Medicine

## 2020-07-27 ENCOUNTER — Other Ambulatory Visit: Payer: Self-pay | Admitting: Internal Medicine

## 2020-08-25 ENCOUNTER — Other Ambulatory Visit: Payer: Self-pay | Admitting: Internal Medicine

## 2020-08-29 ENCOUNTER — Encounter: Payer: Self-pay | Admitting: Internal Medicine

## 2020-09-24 ENCOUNTER — Other Ambulatory Visit: Payer: Self-pay

## 2020-09-24 ENCOUNTER — Other Ambulatory Visit: Payer: Self-pay | Admitting: Internal Medicine

## 2020-09-24 ENCOUNTER — Other Ambulatory Visit (INDEPENDENT_AMBULATORY_CARE_PROVIDER_SITE_OTHER): Payer: 59

## 2020-09-24 DIAGNOSIS — I10 Essential (primary) hypertension: Secondary | ICD-10-CM

## 2020-09-24 DIAGNOSIS — E7800 Pure hypercholesterolemia, unspecified: Secondary | ICD-10-CM

## 2020-09-24 DIAGNOSIS — R739 Hyperglycemia, unspecified: Secondary | ICD-10-CM

## 2020-09-24 LAB — LIPID PANEL
Cholesterol: 238 mg/dL — ABNORMAL HIGH (ref 0–200)
HDL: 55.1 mg/dL (ref >39.00)
NonHDL: 183.22
Total CHOL/HDL Ratio: 4
Triglycerides: 286 mg/dL — ABNORMAL HIGH (ref 0.0–149.0)
VLDL: 57.2 mg/dL — ABNORMAL HIGH (ref 0.0–40.0)

## 2020-09-24 LAB — HEPATIC FUNCTION PANEL
ALT: 20 U/L (ref 0–35)
AST: 16 U/L (ref 0–37)
Albumin: 4.5 g/dL (ref 3.5–5.2)
Alkaline Phosphatase: 53 U/L (ref 39–117)
Bilirubin, Direct: 0.1 mg/dL (ref 0.0–0.3)
Total Bilirubin: 0.3 mg/dL (ref 0.2–1.2)
Total Protein: 6.9 g/dL (ref 6.0–8.3)

## 2020-09-24 LAB — BASIC METABOLIC PANEL
BUN: 21 mg/dL (ref 6–23)
CO2: 25 mEq/L (ref 19–32)
Calcium: 9.3 mg/dL (ref 8.4–10.5)
Chloride: 103 mEq/L (ref 96–112)
Creatinine, Ser: 0.85 mg/dL (ref 0.40–1.20)
GFR: 68.03 mL/min (ref >60.00)
Glucose, Bld: 109 mg/dL — ABNORMAL HIGH (ref 70–99)
Potassium: 4.2 mEq/L (ref 3.5–5.1)
Sodium: 139 mEq/L (ref 135–145)

## 2020-09-24 LAB — TSH: TSH: 2.48 u[IU]/mL (ref 0.35–4.50)

## 2020-09-24 LAB — HEMOGLOBIN A1C: Hgb A1c MFr Bld: 6.6 % — ABNORMAL HIGH (ref 4.6–6.5)

## 2020-09-24 LAB — LDL CHOLESTEROL, DIRECT: Direct LDL: 150 mg/dL

## 2020-09-26 ENCOUNTER — Encounter: Payer: Self-pay | Admitting: Internal Medicine

## 2020-09-26 ENCOUNTER — Other Ambulatory Visit (HOSPITAL_COMMUNITY)
Admission: RE | Admit: 2020-09-26 | Discharge: 2020-09-26 | Disposition: A | Discharge status: Home / Self Care | Payer: 59 | Source: Ambulatory Visit | Attending: Internal Medicine | Admitting: Internal Medicine

## 2020-09-26 ENCOUNTER — Other Ambulatory Visit: Payer: Self-pay

## 2020-09-26 ENCOUNTER — Ambulatory Visit (INDEPENDENT_AMBULATORY_CARE_PROVIDER_SITE_OTHER): Payer: 59 | Admitting: Internal Medicine

## 2020-09-26 VITALS — BP 122/76 | HR 78 | Temp 98.3°F | Resp 16 | Ht 64.0 in | Wt 147.0 lb

## 2020-09-26 DIAGNOSIS — R739 Hyperglycemia, unspecified: Secondary | ICD-10-CM | POA: Diagnosis not present

## 2020-09-26 DIAGNOSIS — Z124 Encounter for screening for malignant neoplasm of cervix: Secondary | ICD-10-CM | POA: Insufficient documentation

## 2020-09-26 DIAGNOSIS — E7800 Pure hypercholesterolemia, unspecified: Secondary | ICD-10-CM

## 2020-09-26 DIAGNOSIS — Z0000 Encounter for general adult medical examination without abnormal findings: Secondary | ICD-10-CM

## 2020-09-26 DIAGNOSIS — M2550 Pain in unspecified joint: Secondary | ICD-10-CM | POA: Diagnosis not present

## 2020-09-26 DIAGNOSIS — Z1211 Encounter for screening for malignant neoplasm of colon: Secondary | ICD-10-CM

## 2020-09-26 DIAGNOSIS — I10 Essential (primary) hypertension: Secondary | ICD-10-CM

## 2020-09-26 DIAGNOSIS — R69 Illness, unspecified: Secondary | ICD-10-CM | POA: Diagnosis not present

## 2020-09-26 DIAGNOSIS — E559 Vitamin D deficiency, unspecified: Secondary | ICD-10-CM | POA: Diagnosis not present

## 2020-09-26 DIAGNOSIS — F439 Reaction to severe stress, unspecified: Secondary | ICD-10-CM | POA: Diagnosis not present

## 2020-09-26 NOTE — Progress Notes (Signed)
Patient ID: Rachel Collins, female   DOB: 1961-03-05, 59 y.o.   MRN: [id number]   Subjective:    Patient ID: Rachel Collins, female    DOB: [birth year], 59 y.o.   MRN: [id number]  HPI This visit occurred during the SARS-CoV-2 public health emergency.  Safety protocols were in place, including screening questions prior to the visit, additional usage of staff PPE, and extensive cleaning of exam room while observing appropriate contact time as indicated for disinfecting solutions.  Patient here for her physical exam. She reports she is doing relatively well.  Increased hours at work.  Does a lot of physical activity.  Reports increased stiffness in her hands.  Arthritis worse.  Discussed with her today.  Discussed stretches, decreased increased physical activity if possible, etc.  No chest pain or sob reported.  No abdominal pain or bowel change reported.  Discussed recent labs.  On mevacor.  Discussed diet.  Blood pressure doing well.  Due colonoscopy.     Past Medical History:  Diagnosis Date  . Esophagitis   . Gastritis   . Gastroparesis   . History of migraine headaches   . Hypercholesterolemia   . Hyperglycemia   . Hypertension   . Paget's bone disease    Past Surgical History:  Procedure Laterality Date  . BREAST BIOPSY Left 2010   benign  . CERVICAL DISC SURGERY     C5-C7 fusion  . TUBAL LIGATION     Family History  Problem Relation Age of Onset  . Heart disease Father        myocardial infarction x 5  . Prostate cancer Father   . Uterine cancer Mother   . COPD Mother   . Diabetes Mother   . Heart disease Mother        myocardial infarction  . Breast cancer Mother 22  . Heart disease Paternal Grandmother   . Heart disease Maternal Grandfather    Social History   Socioeconomic History  . Marital status: Married    Spouse name: Not on file  . Number of children: 1  . Years of education: Not on file  . Highest education level: Not on file  Occupational History  .  Not on file  Tobacco Use  . Smoking status: Never Smoker  . Smokeless tobacco: Never Used  Vaping Use  . Vaping Use: Never used  Substance and Sexual Activity  . Alcohol use: Yes    Alcohol/week: 7.0 standard drinks    Types: 7 Glasses of wine per week    Comment: 1 glass with dinner  . Drug use: No  . Sexual activity: Not on file  Other Topics Concern  . Not on file  Social History Narrative  . Not on file   Social Determinants of Health   Financial Resource Strain:   . Difficulty of Paying Living Expenses: Not on file  Food Insecurity:   . Worried About Charity fundraiser in the Last Year: Not on file  . Ran Out of Food in the Last Year: Not on file  Transportation Needs:   . Lack of Transportation (Medical): Not on file  . Lack of Transportation (Non-Medical): Not on file  Physical Activity:   . Days of Exercise per Week: Not on file  . Minutes of Exercise per Session: Not on file  Stress:   . Feeling of Stress : Not on file  Social Connections:   . Frequency of Communication with Friends and Family: Not on  file  . Frequency of Social Gatherings with Friends and Family: Not on file  . Attends Religious Services: Not on file  . Active Member of Clubs or Organizations: Not on file  . Attends Archivist Meetings: Not on file  . Marital Status: Not on file    Outpatient Encounter Medications as of 09/26/2020  Medication Sig  . amLODipine (NORVASC) 5 MG tablet TAKE 1 TABLET BY MOUTH EVERY DAY  . hydrochlorothiazide (HYDRODIURIL) 25 MG tablet Take 1 tablet (25 mg total) by mouth daily.  Marland Kitchen losartan (COZAAR) 100 MG tablet TAKE 1 TABLET BY MOUTH EVERY DAY  . lovastatin (MEVACOR) 20 MG tablet TAKE 1 TABLET BY MOUTH EVERYDAY AT BEDTIME  . sertraline (ZOLOFT) 50 MG tablet TAKE 1 AND 1/2 TABLETS BY MOUTH DAILY  . traZODone (DESYREL) 50 MG tablet TAKE 1-2 TABLETS BY MOUTH AT BEDTIME.  . [DISCONTINUED] azithromycin (ZITHROMAX) 250 MG tablet Take 2 tablets x 1 day and  then 1 tablet per day for four more days.   No facility-administered encounter medications on file as of 09/26/2020.    Review of Systems  Constitutional: Negative for appetite change and unexpected weight change.  HENT: Negative for congestion, sinus pressure and sore throat.   Eyes: Negative for pain and visual disturbance.  Respiratory: Negative for cough, chest tightness and shortness of breath.   Cardiovascular: Negative for chest pain, palpitations and leg swelling.  Gastrointestinal: Negative for abdominal pain, constipation, diarrhea and vomiting.  Genitourinary: Negative for difficulty urinating and dysuria.  Musculoskeletal: Negative for back pain and joint swelling.  Skin: Negative for color change and rash.  Neurological: Negative for dizziness, light-headedness and headaches.  Hematological: Negative for adenopathy. Does not bruise/bleed easily.  Psychiatric/Behavioral: Negative for agitation and dysphoric mood.       Objective:    Physical Exam Vitals reviewed.  Constitutional:      General: She is not in acute distress.    Appearance: Normal appearance. She is well-developed.  HENT:     Head: Normocephalic and atraumatic.     Right Ear: External ear normal.     Left Ear: External ear normal.  Eyes:     General: No scleral icterus.       Right eye: No discharge.        Left eye: No discharge.     Conjunctiva/sclera: Conjunctivae normal.  Neck:     Thyroid: No thyromegaly.  Cardiovascular:     Rate and Rhythm: Normal rate and regular rhythm.  Pulmonary:     Effort: No tachypnea, accessory muscle usage or respiratory distress.     Breath sounds: Normal breath sounds. No decreased breath sounds or wheezing.  Chest:     Breasts:        Right: No inverted nipple, mass, nipple discharge or tenderness (no axillary adenopathy).        Left: No inverted nipple, mass, nipple discharge or tenderness (no axilarry adenopathy).  Abdominal:     General: Bowel sounds are  normal.     Palpations: Abdomen is soft.     Tenderness: There is no abdominal tenderness.  Genitourinary:    Comments: Normal external genitalia.  Vaginal vault without lesions.  Cervix identified.  Pap smear performed.  Could not appreciate any adnexal masses or tenderness.   Musculoskeletal:        General: No swelling or tenderness.     Cervical back: Neck supple. No tenderness.  Lymphadenopathy:     Cervical: No cervical adenopathy.  Skin:    Findings: No erythema or rash.  Neurological:     Mental Status: She is alert and oriented to person, place, and time.  Psychiatric:        Mood and Affect: Mood normal.        Behavior: Behavior normal.     BP 122/76   Pulse 78   Temp 98.3 F (36.8 C) (Oral)   Resp 16   Ht 5' 4" (1.626 m)   Wt 147 lb (66.7 kg)   SpO2 99%   BMI 25.23 kg/m  Wt Readings from Last 3 Encounters:  09/26/20 147 lb (66.7 kg)  06/01/20 146 lb 3.2 oz (66.3 kg)  12/13/19 147 lb (66.7 kg)     Lab Results  Component Value Date   WBC 5.5 05/23/2020   HGB 14.2 05/23/2020   HCT 42.2 05/23/2020   PLT 269.0 05/23/2020   GLUCOSE 109 (H) 09/24/2020   CHOL 238 (H) 09/24/2020   TRIG 286.0 (H) 09/24/2020   HDL 55.10 09/24/2020   LDLDIRECT 150.0 09/24/2020   LDLCALC 148 (H) 05/23/2020   ALT 20 09/24/2020   AST 16 09/24/2020   NA 139 09/24/2020   K 4.2 09/24/2020   CL 103 09/24/2020   CREATININE 0.85 09/24/2020   BUN 21 09/24/2020   CO2 25 09/24/2020   TSH 2.48 09/24/2020   HGBA1C 6.6 (H) 09/24/2020   MICROALBUR 1.1 03/09/2015    MM 3D SCREEN BREAST BILATERAL  Result Date: 01/06/2019 CLINICAL DATA:  Screening. EXAM: DIGITAL SCREENING BILATERAL MAMMOGRAM WITH TOMO AND CAD COMPARISON:  Previous exam(s). ACR Breast Density Category c: The breast tissue is heterogeneously dense, which may obscure small masses. FINDINGS: There are no findings suspicious for malignancy. Images were processed with CAD. IMPRESSION: No mammographic evidence of malignancy. A  result letter of this screening mammogram will be mailed directly to the patient. RECOMMENDATION: Screening mammogram in one year. (Code:SM-B-01Y) BI-RADS CATEGORY  1: Negative. Electronically Signed   By: Fidela Salisbury M.D.   On: 01/06/2019 12:30       Assessment & Plan:   Problem List Items Addressed This Visit    Vitamin D deficiency    Follow vitamin D level.       Stress    On zoloft.  Discussed with her today.  Overall handling things relatively well.  Follow.        Joint pain    Joint stiffness as outlined. Discussed overuse, stretches, etc. Wants to follow.       Hypertension    Blood pressure doing well.  Continue losartan, hctz and amlodipine.  Follow pressures. Follow metabolic panel.       Relevant Orders   Basic metabolic panel   Hyperglycemia    Low carb diet and exercise.  Follow met b and a1c.       Relevant Orders   Hemoglobin A1c   Hypercholesterolemia    On mevacor.  Low cholesterol diet and exercise.  Follow lipid panel and liver function tests.   Lab Results  Component Value Date   CHOL 238 (H) 09/24/2020   HDL 55.10 09/24/2020   LDLCALC 148 (H) 05/23/2020   LDLDIRECT 150.0 09/24/2020   TRIG 286.0 (H) 09/24/2020   CHOLHDL 4 09/24/2020        Relevant Orders   Hepatic function panel   Lipid panel   Health care maintenance    Physical today 09/26/20.  PAP 09/26/20.  Mammogram 01/06/19 - [facility].  Schedule f/u mammogram.  Refer to GI for colonoscopy.        Colon cancer screening    Refer to GI for colonoscopy.        Relevant Orders   Ambulatory referral to Gastroenterology    Other Visit Diagnoses    Routine general medical examination at a health care facility    -  Primary   Cervical cancer screening       Relevant Orders   Cytology - PAP( [facility]) (Completed)       Einar Pheasant, MD

## 2020-09-26 NOTE — Assessment & Plan Note (Signed)
Physical today 09/26/20.  PAP 09/26/20.  Mammogram 01/06/19 - [facility].  Schedule f/u mammogram.  Refer to GI for colonoscopy.

## 2020-09-27 LAB — CYTOLOGY - PAP
Comment: NEGATIVE
Diagnosis: NEGATIVE
High risk HPV: NEGATIVE

## 2020-10-07 ENCOUNTER — Encounter: Payer: Self-pay | Admitting: Internal Medicine

## 2020-10-07 NOTE — Assessment & Plan Note (Signed)
Refer to GI for colonoscopy.

## 2020-10-07 NOTE — Assessment & Plan Note (Signed)
On zoloft.  Discussed with her today.  Overall handling things relatively well.  Follow.

## 2020-10-07 NOTE — Assessment & Plan Note (Signed)
Joint stiffness as outlined. Discussed overuse, stretches, etc. Wants to follow.

## 2020-10-07 NOTE — Assessment & Plan Note (Signed)
Follow vitamin D level.  

## 2020-10-07 NOTE — Assessment & Plan Note (Signed)
Blood pressure doing well.  Continue losartan, hctz and amlodipine.  Follow pressures. Follow metabolic panel.

## 2020-10-07 NOTE — Assessment & Plan Note (Signed)
On mevacor.  Low cholesterol diet and exercise.  Follow lipid panel and liver function tests.   Lab Results  Component Value Date   CHOL 238 (H) 09/24/2020   HDL 55.10 09/24/2020   LDLCALC 148 (H) 05/23/2020   LDLDIRECT 150.0 09/24/2020   TRIG 286.0 (H) 09/24/2020   CHOLHDL 4 09/24/2020

## 2020-10-07 NOTE — Assessment & Plan Note (Signed)
Low carb diet and exercise.  Follow met b and a1c.  

## 2020-10-14 ENCOUNTER — Other Ambulatory Visit: Payer: Self-pay | Admitting: Internal Medicine

## 2020-10-18 IMAGING — MG DIGITAL SCREENING BILATERAL MAMMOGRAM WITH TOMO AND CAD
8 series · 8 of 24 positions shown · non-contrast
Comparison: Previous exam(s).

CLINICAL DATA: Screening.

EXAM:
DIGITAL SCREENING BILATERAL MAMMOGRAM WITH TOMO AND CAD

[L MLO synth-2D]
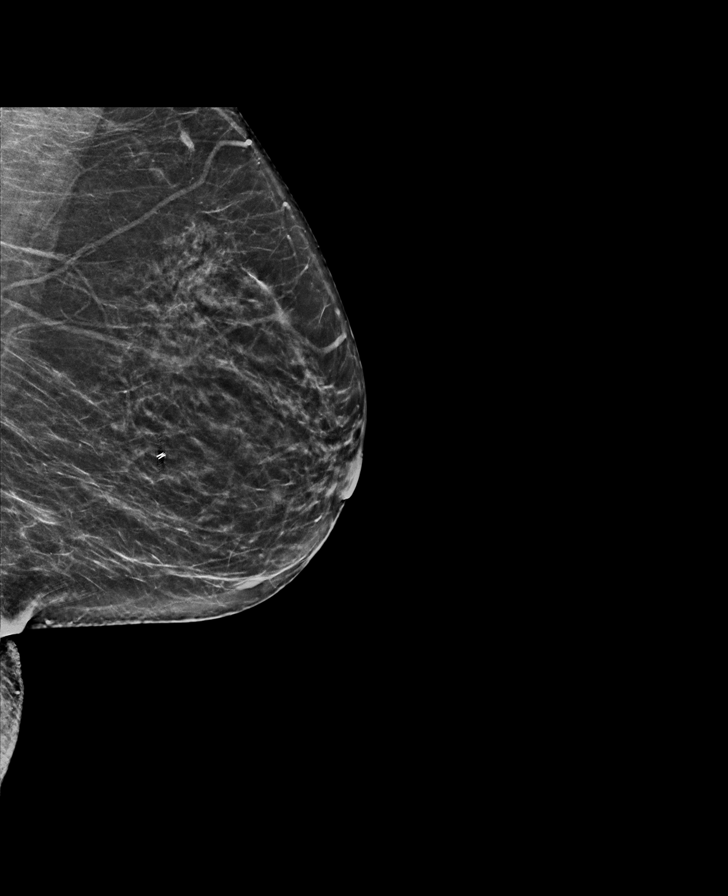

[L CC synth-2D]
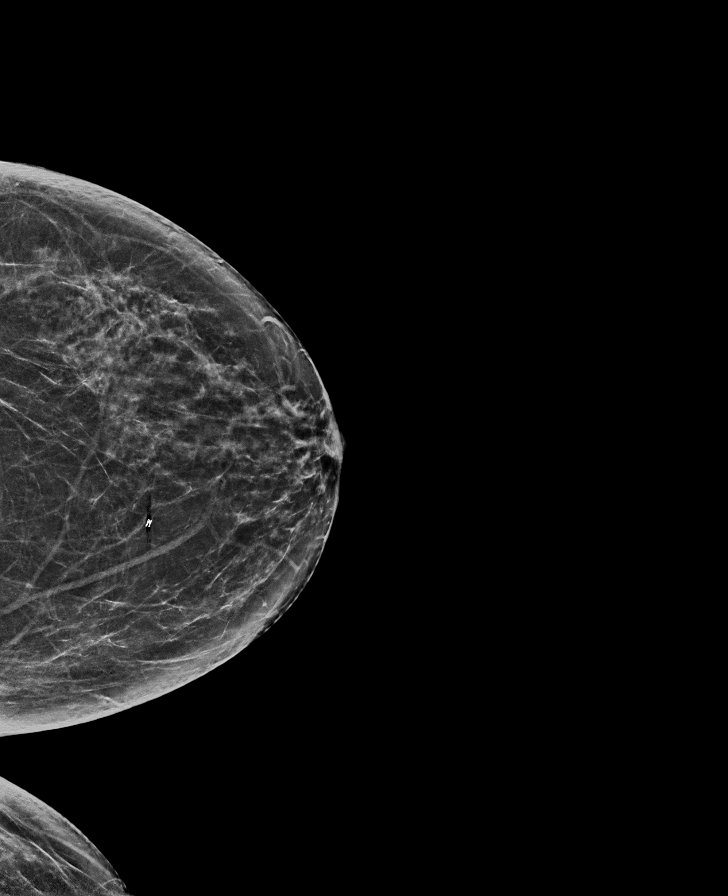

[R CC synth-2D]
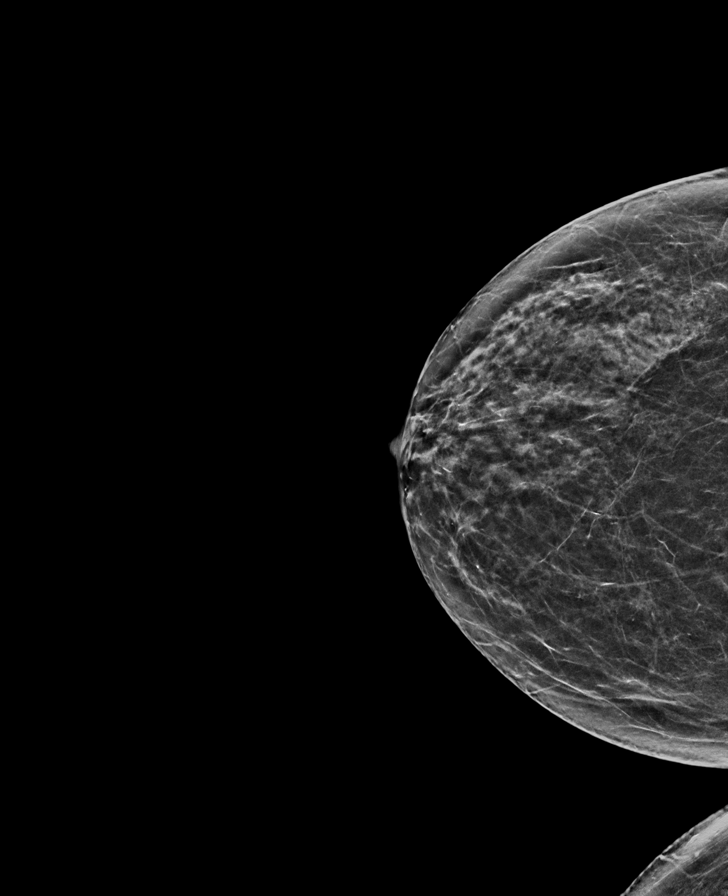

[R MLO synth-2D]
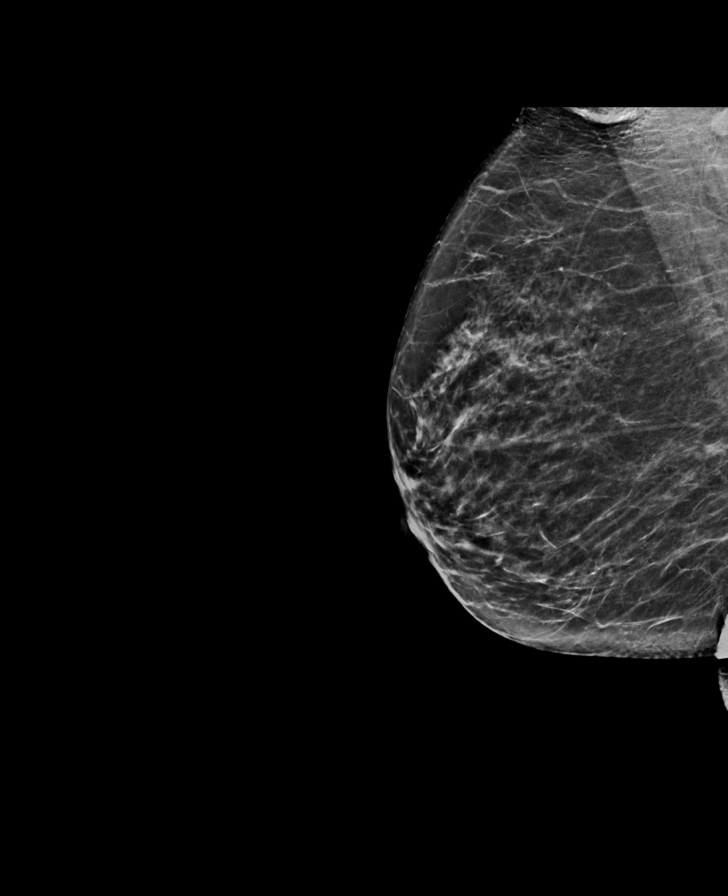

[L CC tomo · tomo slice 31/62.0]
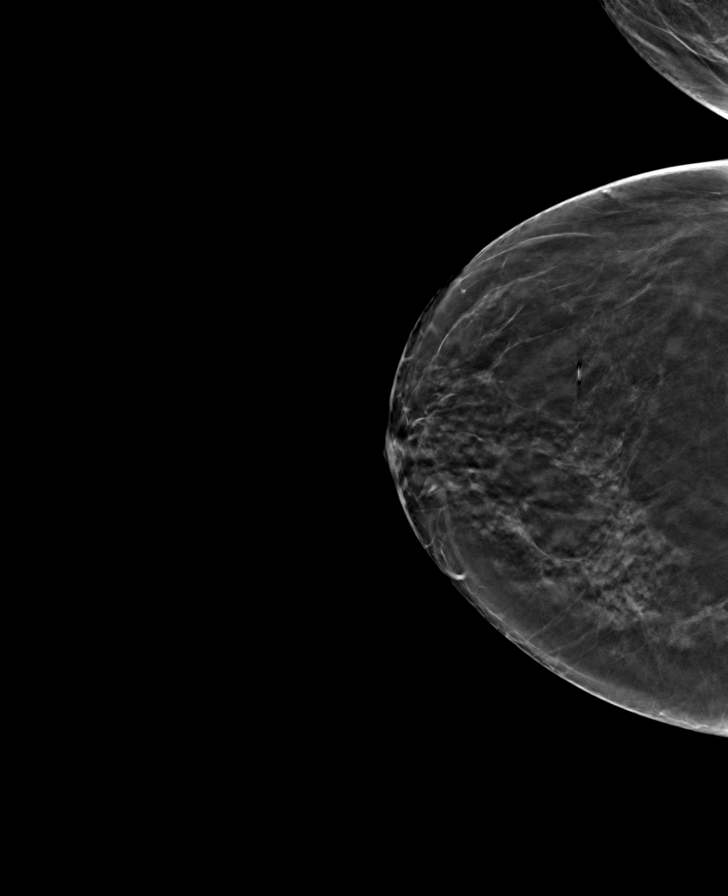

[L MLO tomo · tomo slice 31/61.0]
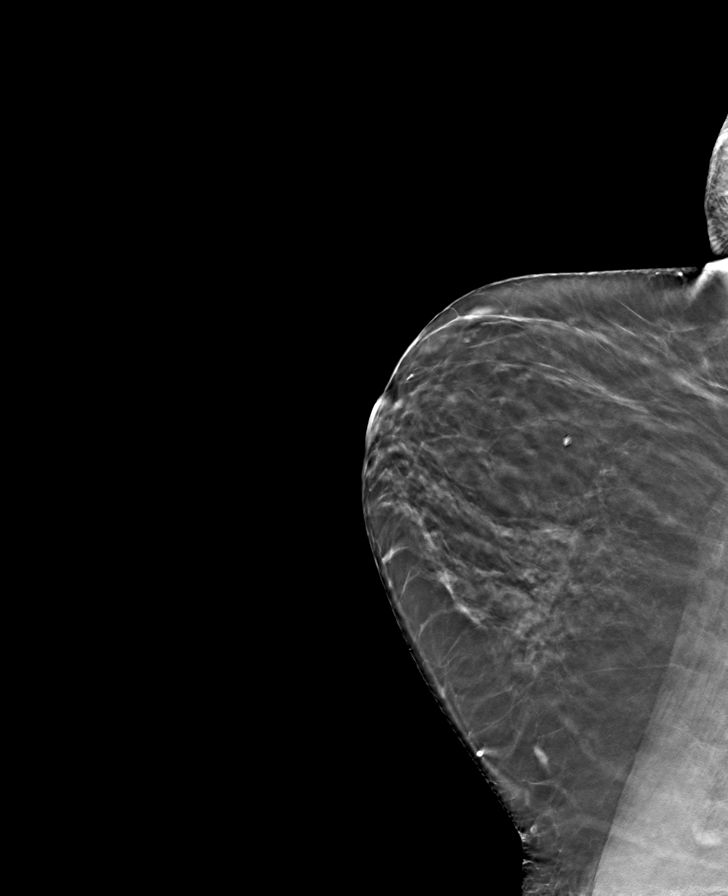

[R MLO tomo · tomo slice 31/61.0]
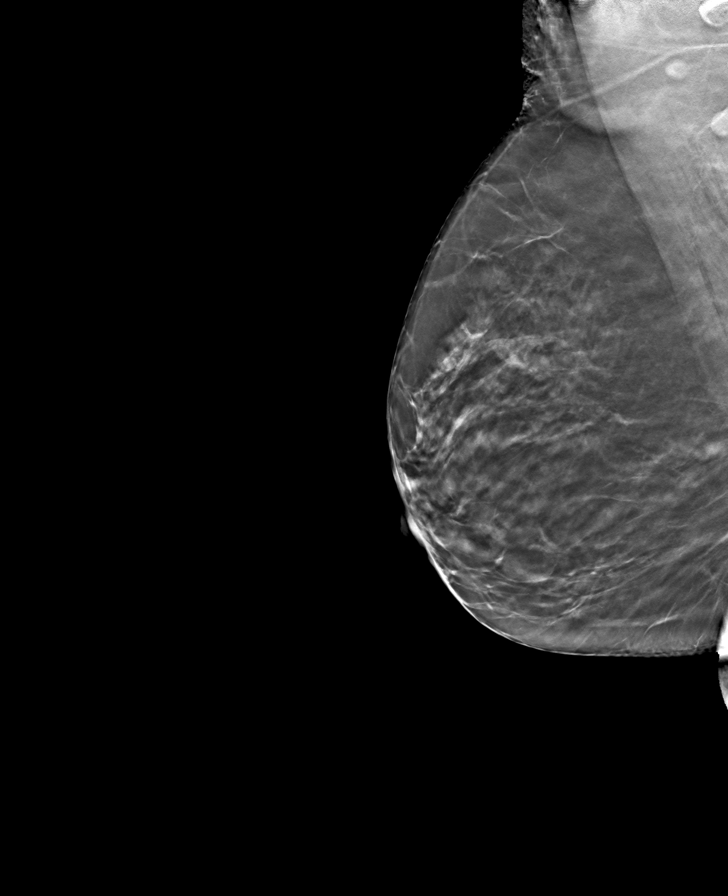

[R CC tomo · tomo slice 29/57.0]
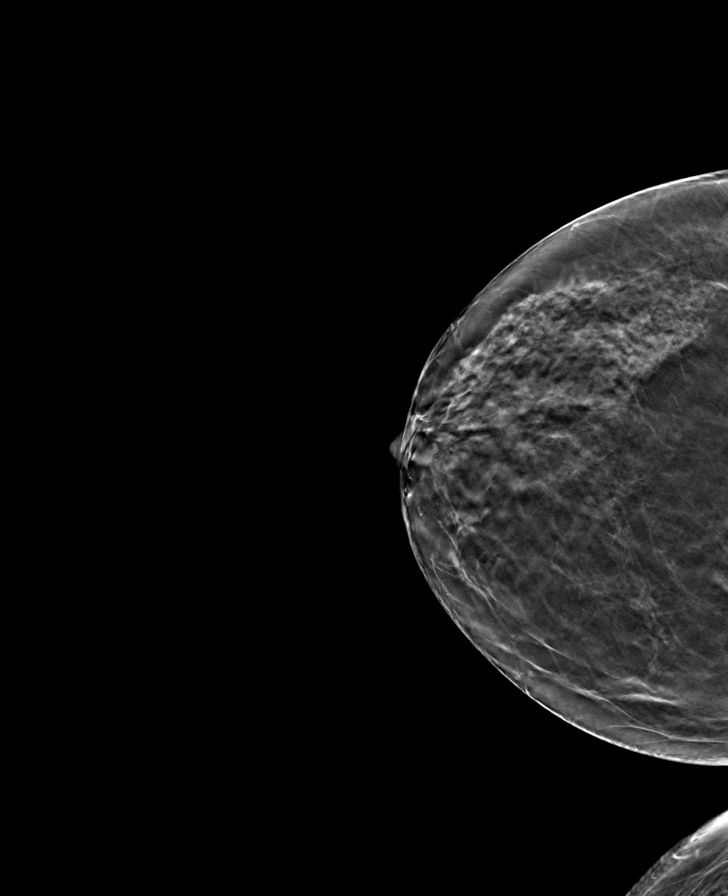

[8 of 24 positions shown; findings below may reference images not displayed]

ACR Breast Density Category c: The breast tissue is heterogeneously
dense, which may obscure small masses.
FINDINGS: There are no findings suspicious for malignancy. Images were
processed with CAD.
IMPRESSION: No mammographic evidence of malignancy. A result letter of this
screening mammogram will be mailed directly to the patient.

RECOMMENDATION:
Screening mammogram in one year. (Code:FT-[id number])

BI-RADS CATEGORY  1: Negative.

## 2020-10-24 ENCOUNTER — Ambulatory Visit
Admission: RE | Admit: 2020-10-24 | Discharge: 2020-10-24 | Disposition: A | Discharge status: Home / Self Care | Payer: 59 | Source: Ambulatory Visit | Attending: Internal Medicine | Admitting: Internal Medicine

## 2020-10-24 ENCOUNTER — Other Ambulatory Visit: Payer: Self-pay

## 2020-10-24 DIAGNOSIS — Z1231 Encounter for screening mammogram for malignant neoplasm of breast: Secondary | ICD-10-CM

## 2020-11-05 ENCOUNTER — Other Ambulatory Visit: Payer: Self-pay | Admitting: Internal Medicine

## 2020-12-05 ENCOUNTER — Other Ambulatory Visit: Payer: Self-pay | Admitting: Internal Medicine

## 2021-01-03 ENCOUNTER — Encounter: Payer: Self-pay | Admitting: Internal Medicine

## 2021-01-03 DIAGNOSIS — R300 Dysuria: Secondary | ICD-10-CM

## 2021-01-03 NOTE — Telephone Encounter (Signed)
schedule

## 2021-01-03 NOTE — Telephone Encounter (Signed)
Labs ordered for [facility]. Patient scheduled with Dr Lorin Picket to discuss

## 2021-01-04 ENCOUNTER — Other Ambulatory Visit
Admission: RE | Admit: 2021-01-04 | Discharge: 2021-01-04 | Disposition: A | Discharge status: Home / Self Care | Payer: 59 | Attending: Internal Medicine | Admitting: Internal Medicine

## 2021-01-04 ENCOUNTER — Telehealth (INDEPENDENT_AMBULATORY_CARE_PROVIDER_SITE_OTHER): Payer: 59 | Admitting: Internal Medicine

## 2021-01-04 ENCOUNTER — Telehealth: Payer: Self-pay | Admitting: Internal Medicine

## 2021-01-04 ENCOUNTER — Encounter: Payer: Self-pay | Admitting: Internal Medicine

## 2021-01-04 DIAGNOSIS — R350 Frequency of micturition: Secondary | ICD-10-CM | POA: Diagnosis not present

## 2021-01-04 DIAGNOSIS — I10 Essential (primary) hypertension: Secondary | ICD-10-CM

## 2021-01-04 DIAGNOSIS — R300 Dysuria: Secondary | ICD-10-CM

## 2021-01-04 LAB — URINALYSIS, ROUTINE W REFLEX MICROSCOPIC
Bacteria, UA: NONE SEEN
Bilirubin Urine: NEGATIVE
Glucose, UA: NEGATIVE mg/dL
Ketones, ur: NEGATIVE mg/dL
Leukocytes,Ua: NEGATIVE
Nitrite: NEGATIVE
Protein, ur: NEGATIVE mg/dL
Specific Gravity, Urine: 1.011 (ref 1.005–1.030)
pH: 6 (ref 5.0–8.0)

## 2021-01-04 MED ORDER — NITROFURANTOIN MONOHYD MACRO 100 MG PO CAPS: 100.0000 mg | ORAL_CAPSULE | Freq: Two times a day (BID) | ORAL | 0 refills | Status: DC

## 2021-01-04 MED ORDER — PHENAZOPYRIDINE HCL 200 MG PO TABS: 200.0000 mg | ORAL_TABLET | Freq: Three times a day (TID) | ORAL | 0 refills | Status: DC | PRN

## 2021-01-04 NOTE — Progress Notes (Signed)
Patient ID: Rachel Collins, female   DOB: 06-Sep-1961, 60 y.o.   MRN: [id number]   Virtual Visit via telephone Note  This visit type was conducted due to national recommendations for restrictions regarding the COVID-19 pandemic (e.g. social distancing).  This format is felt to be most appropriate for this patient at this time.  All issues noted in this document were discussed and addressed.  No physical exam was performed (except for noted visual exam findings with Video Visits).   I connected with Silvio Clayman by telephone and verified that I am speaking with the correct person using two identifiers. Location patient: home Location provider: work Persons participating in the telephone visit: patient, provider  The limitations, risks, security and privacy concerns of performing an evaluation and management service by telephone and the availability of in person appointments have been discussed.  It has also been discussed with the patient that there may be a patient responsible charge related to this service. The patient expressed understanding and agreed to proceed.   Reason for visit: work in appt  HPI: Work in for possible UTI.  Reports that starting 2.5 weeks ago, she noticed bad urinary odor and increased frequency.  Also noticed back pain (waist level).  Increased bladder pressure. Took otc test - positive.  Not comfortable sitting or lying down.  No dysuria.  No hematuria.  Increased fatigue.  No nausea or vomiting.  No fever.  Has felt hot at times.  No diarrhea.  Taking some ibuprofen. Symptoms feel similar to previous UTI.  No vaginal symptoms.     ROS: See pertinent positives and negatives per HPI.  Past Medical History:  Diagnosis Date  . Esophagitis   . Gastritis   . Gastroparesis   . History of migraine headaches   . Hypercholesterolemia   . Hyperglycemia   . Hypertension   . Paget's bone disease     Past Surgical History:  Procedure Laterality Date  . BREAST BIOPSY Left  2010   benign  . CERVICAL DISC SURGERY     C5-C7 fusion  . TUBAL LIGATION      Family History  Problem Relation Age of Onset  . Heart disease Father        myocardial infarction x 5  . Prostate cancer Father   . Uterine cancer Mother   . COPD Mother   . Diabetes Mother   . Heart disease Mother        myocardial infarction  . Breast cancer Mother 11  . Heart disease Paternal Grandmother   . Heart disease Maternal Grandfather     SOCIAL HX: reviewed.    Current Outpatient Medications:  .  amLODipine (NORVASC) 5 MG tablet, TAKE 1 TABLET BY MOUTH EVERY DAY, Disp: 30 tablet, Rfl: 5 .  hydrochlorothiazide (HYDRODIURIL) 25 MG tablet, TAKE 1 TABLET BY MOUTH EVERY DAY, Disp: 30 tablet, Rfl: 5 .  losartan (COZAAR) 100 MG tablet, TAKE 1 TABLET BY MOUTH EVERY DAY, Disp: 30 tablet, Rfl: 5 .  lovastatin (MEVACOR) 20 MG tablet, TAKE 1 TABLET BY MOUTH EVERYDAY AT BEDTIME, Disp: 30 tablet, Rfl: 5 .  nitrofurantoin, macrocrystal-monohydrate, (MACROBID) 100 MG capsule, Take 1 capsule (100 mg total) by mouth 2 (two) times daily., Disp: 10 capsule, Rfl: 0 .  phenazopyridine (PYRIDIUM) 200 MG tablet, Take 1 tablet (200 mg total) by mouth 3 (three) times daily as needed for pain., Disp: 6 tablet, Rfl: 0 .  sertraline (ZOLOFT) 50 MG tablet, TAKE 1 AND 1/2 TABLETS BY  MOUTH DAILY, Disp: 45 tablet, Rfl: 5 .  traZODone (DESYREL) 50 MG tablet, TAKE 1-2 TABLETS BY MOUTH AT BEDTIME., Disp: 180 tablet, Rfl: 1  EXAM:  GENERAL: alert.  Sounds to be in no acute distress.  Answering questions appropriately.   PSYCH/NEURO: pleasant and cooperative, no obvious depression or anxiety, speech and thought processing grossly intact  ASSESSMENT AND PLAN:  Discussed the following assessment and plan:  Problem List Items Addressed This Visit    Hypertension    Blood pressure has been under good control.  Continue losartan, hctz and amlodipine.  Follow pressures.  Follow metabolic panel.       Urinary frequency     Increased urinary frequency as outlined.  Also has noticed odor and increased back discomfort as outlined.  Increased lower abdominal pressure.  Symptoms feel similar to her previous UTIs.  Urinalysis - hgb present, but otherwise clear.  Discussed treatment with her.  Pyridium as directed.  Sent in rx for macrobid.  Await culture results.  Further w/up pending response.            I discussed the assessment and treatment plan with the patient. The patient was provided an opportunity to ask questions and all were answered. The patient agreed with the plan and demonstrated an understanding of the instructions.   The patient was advised to call back or seek an in-person evaluation if the symptoms worsen or if the condition fails to improve as anticipated.  I provided 23 minutes of non-face-to-face time during this encounter.   Dale Waukee, MD

## 2021-01-04 NOTE — Telephone Encounter (Signed)
Patient called in stated that she had to go back to work could Dr.Scott call her about the UTI

## 2021-01-05 ENCOUNTER — Encounter: Payer: Self-pay | Admitting: Internal Medicine

## 2021-01-05 DIAGNOSIS — R350 Frequency of micturition: Secondary | ICD-10-CM | POA: Insufficient documentation

## 2021-01-05 LAB — URINE CULTURE: Culture: NO GROWTH

## 2021-01-05 NOTE — Assessment & Plan Note (Signed)
Blood pressure has been under good control.  Continue losartan, hctz and amlodipine.  Follow pressures.  Follow metabolic panel.  

## 2021-01-05 NOTE — Assessment & Plan Note (Signed)
Increased urinary frequency as outlined.  Also has noticed odor and increased back discomfort as outlined.  Increased lower abdominal pressure.  Symptoms feel similar to her previous UTIs.  Urinalysis - hgb present, but otherwise clear.  Discussed treatment with her.  Pyridium as directed.  Sent in rx for macrobid.  Await culture results.  Further w/up pending response.

## 2021-01-07 NOTE — Telephone Encounter (Signed)
Pt was notified via mychart on 01/06/21 (mychart has not been read). And I left a voicemail on 01/07/21 with results below:  Your urine culture did not reveal an infection to be present.  Let me know if you are having persistent problems.     Dr Lorin Picket

## 2021-01-24 ENCOUNTER — Other Ambulatory Visit: Payer: Self-pay

## 2021-01-24 ENCOUNTER — Other Ambulatory Visit (INDEPENDENT_AMBULATORY_CARE_PROVIDER_SITE_OTHER): Payer: 59

## 2021-01-24 DIAGNOSIS — K219 Gastro-esophageal reflux disease without esophagitis: Secondary | ICD-10-CM | POA: Diagnosis not present

## 2021-01-24 DIAGNOSIS — R1013 Epigastric pain: Secondary | ICD-10-CM | POA: Diagnosis not present

## 2021-01-24 DIAGNOSIS — E7800 Pure hypercholesterolemia, unspecified: Secondary | ICD-10-CM

## 2021-01-24 DIAGNOSIS — Z8371 Family history of colonic polyps: Secondary | ICD-10-CM | POA: Diagnosis not present

## 2021-01-24 DIAGNOSIS — I10 Essential (primary) hypertension: Secondary | ICD-10-CM | POA: Diagnosis not present

## 2021-01-24 DIAGNOSIS — R739 Hyperglycemia, unspecified: Secondary | ICD-10-CM | POA: Diagnosis not present

## 2021-01-24 LAB — BASIC METABOLIC PANEL
BUN: 19 mg/dL (ref 6–23)
CO2: 30 mEq/L (ref 19–32)
Calcium: 9.4 mg/dL (ref 8.4–10.5)
Chloride: 102 mEq/L (ref 96–112)
Creatinine, Ser: 0.72 mg/dL (ref 0.40–1.20)
GFR: 90.39 mL/min (ref >60.00)
Glucose, Bld: 124 mg/dL — ABNORMAL HIGH (ref 70–99)
Potassium: 4.1 mEq/L (ref 3.5–5.1)
Sodium: 138 mEq/L (ref 135–145)

## 2021-01-24 LAB — HEPATIC FUNCTION PANEL
ALT: 25 U/L (ref 0–35)
AST: 18 U/L (ref 0–37)
Albumin: 4.6 g/dL (ref 3.5–5.2)
Alkaline Phosphatase: 55 U/L (ref 39–117)
Bilirubin, Direct: 0.1 mg/dL (ref 0.0–0.3)
Total Bilirubin: 0.4 mg/dL (ref 0.2–1.2)
Total Protein: 6.7 g/dL (ref 6.0–8.3)

## 2021-01-24 LAB — LIPID PANEL
Cholesterol: 215 mg/dL — ABNORMAL HIGH (ref 0–200)
HDL: 54 mg/dL (ref >39.00)
LDL Cholesterol: 133 mg/dL — ABNORMAL HIGH (ref 0–99)
NonHDL: 160.99
Total CHOL/HDL Ratio: 4
Triglycerides: 139 mg/dL (ref 0.0–149.0)
VLDL: 27.8 mg/dL (ref 0.0–40.0)

## 2021-01-24 LAB — HEMOGLOBIN A1C: Hgb A1c MFr Bld: 6.4 % (ref 4.6–6.5)

## 2021-01-28 ENCOUNTER — Ambulatory Visit: Payer: 59 | Admitting: Internal Medicine

## 2021-01-28 ENCOUNTER — Other Ambulatory Visit: Payer: Self-pay

## 2021-01-28 ENCOUNTER — Encounter: Payer: Self-pay | Admitting: Internal Medicine

## 2021-01-28 DIAGNOSIS — I10 Essential (primary) hypertension: Secondary | ICD-10-CM

## 2021-01-28 DIAGNOSIS — R109 Unspecified abdominal pain: Secondary | ICD-10-CM | POA: Diagnosis not present

## 2021-01-28 DIAGNOSIS — R739 Hyperglycemia, unspecified: Secondary | ICD-10-CM | POA: Diagnosis not present

## 2021-01-28 DIAGNOSIS — F439 Reaction to severe stress, unspecified: Secondary | ICD-10-CM | POA: Diagnosis not present

## 2021-01-28 DIAGNOSIS — R5383 Other fatigue: Secondary | ICD-10-CM | POA: Diagnosis not present

## 2021-01-28 DIAGNOSIS — R69 Illness, unspecified: Secondary | ICD-10-CM | POA: Diagnosis not present

## 2021-01-28 DIAGNOSIS — E7800 Pure hypercholesterolemia, unspecified: Secondary | ICD-10-CM | POA: Diagnosis not present

## 2021-01-28 DIAGNOSIS — G2581 Restless legs syndrome: Secondary | ICD-10-CM | POA: Diagnosis not present

## 2021-01-28 LAB — CBC WITH DIFFERENTIAL/PLATELET
Basophils Absolute: 0.1 10*3/uL (ref 0.0–0.1)
Basophils Relative: 1.1 % (ref 0.0–3.0)
Eosinophils Absolute: 0.2 10*3/uL (ref 0.0–0.7)
Eosinophils Relative: 2.8 % (ref 0.0–5.0)
HCT: 42.4 % (ref 36.0–46.0)
Hemoglobin: 14 g/dL (ref 12.0–15.0)
Lymphocytes Relative: 24 % (ref 12.0–46.0)
Lymphs Abs: 1.5 10*3/uL (ref 0.7–4.0)
MCHC: 33 g/dL (ref 30.0–36.0)
MCV: 91.6 fl (ref 78.0–100.0)
Monocytes Absolute: 0.6 10*3/uL (ref 0.1–1.0)
Monocytes Relative: 10.3 % (ref 3.0–12.0)
Neutro Abs: 3.8 10*3/uL (ref 1.4–7.7)
Neutrophils Relative %: 61.8 % (ref 43.0–77.0)
Platelets: 283 10*3/uL (ref 150.0–400.0)
RBC: 4.63 Mil/uL (ref 3.87–5.11)
RDW: 14.2 % (ref 11.5–15.5)
WBC: 6.1 10*3/uL (ref 4.0–10.5)

## 2021-01-28 LAB — IBC + FERRITIN
Ferritin: 190.7 ng/mL (ref 10.0–291.0)
Iron: 111 ug/dL (ref 42–145)
Saturation Ratios: 30 % (ref 20.0–50.0)
Transferrin: 264 mg/dL (ref 212.0–360.0)

## 2021-01-28 LAB — VITAMIN B12: Vitamin B-12: 342 pg/mL (ref 211–911)

## 2021-01-28 LAB — TSH: TSH: 1.67 u[IU]/mL (ref 0.35–4.50)

## 2021-01-28 MED ORDER — LOVASTATIN 40 MG PO TABS: 40.0000 mg | ORAL_TABLET | Freq: Every day | ORAL | 1 refills | Status: DC

## 2021-01-28 NOTE — Progress Notes (Signed)
Patient ID: Rachel Collins, female   DOB: November 26, 1961, 60 y.o.   MRN: [id number]   Subjective:    Patient ID: Rachel Collins, female    DOB: 03/22/1961, 60 y.o.   MRN: [id number]  HPI This visit occurred during the SARS-CoV-2 public health emergency.  Safety protocols were in place, including screening questions prior to the visit, additional usage of staff PPE, and extensive cleaning of exam room while observing appropriate contact time as indicated for disinfecting solutions.  Patient here for a scheduled follow up. Here to follow up regarding her blood pressure, blood sugar and cholesterol.  Also with increased stress.  Increased stress at work. Discussed.  Discussed counseling.  She does not feel needs at this time.  Stays active.  No chest pain or sob with increased activity or exertion.  She is having pain and emesis again.  Saw GI. Planning for EGD with colonoscopy 02/13/21.  Planning to start protonix.  Bowels moving.  Discussed labs.  Restless legs.    Past Medical History:  Diagnosis Date  . Esophagitis   . Gastritis   . Gastroparesis   . History of migraine headaches   . Hypercholesterolemia   . Hyperglycemia   . Hypertension   . Paget's bone disease    Past Surgical History:  Procedure Laterality Date  . BREAST BIOPSY Left 2010   benign  . CERVICAL DISC SURGERY     C5-C7 fusion  . TUBAL LIGATION     Family History  Problem Relation Age of Onset  . Heart disease Father        myocardial infarction x 5  . Prostate cancer Father   . Uterine cancer Mother   . COPD Mother   . Diabetes Mother   . Heart disease Mother        myocardial infarction  . Breast cancer Mother 76  . Heart disease Paternal Grandmother   . Heart disease Maternal Grandfather    Social History   Socioeconomic History  . Marital status: Married    Spouse name: Not on file  . Number of children: 1  . Years of education: Not on file  . Highest education level: Not on file  Occupational History   . Not on file  Tobacco Use  . Smoking status: Never Smoker  . Smokeless tobacco: Never Used  Vaping Use  . Vaping Use: Never used  Substance and Sexual Activity  . Alcohol use: Yes    Alcohol/week: 7.0 standard drinks    Types: 7 Glasses of wine per week    Comment: 1 glass with dinner  . Drug use: No  . Sexual activity: Not on file  Other Topics Concern  . Not on file  Social History Narrative  . Not on file   Social Determinants of Health   Financial Resource Strain: Not on file  Food Insecurity: Not on file  Transportation Needs: Not on file  Physical Activity: Not on file  Stress: Not on file  Social Connections: Not on file    Outpatient Encounter Medications as of 01/28/2021  Medication Sig  . lovastatin (MEVACOR) 40 MG tablet Take 1 tablet (40 mg total) by mouth at bedtime.  Marland Kitchen amLODipine (NORVASC) 5 MG tablet TAKE 1 TABLET BY MOUTH EVERY DAY  . hydrochlorothiazide (HYDRODIURIL) 25 MG tablet TAKE 1 TABLET BY MOUTH EVERY DAY  . nitrofurantoin, macrocrystal-monohydrate, (MACROBID) 100 MG capsule Take 1 capsule (100 mg total) by mouth 2 (two) times daily.  . phenazopyridine (PYRIDIUM)  200 MG tablet Take 1 tablet (200 mg total) by mouth 3 (three) times daily as needed for pain.  Marland Kitchen sertraline (ZOLOFT) 50 MG tablet TAKE 1 AND 1/2 TABLETS BY MOUTH DAILY  . traZODone (DESYREL) 50 MG tablet TAKE 1-2 TABLETS BY MOUTH AT BEDTIME.  . [DISCONTINUED] losartan (COZAAR) 100 MG tablet TAKE 1 TABLET BY MOUTH EVERY DAY  . [DISCONTINUED] lovastatin (MEVACOR) 20 MG tablet TAKE 1 TABLET BY MOUTH EVERYDAY AT BEDTIME   No facility-administered encounter medications on file as of 01/28/2021.    Review of Systems  Constitutional: Negative for appetite change and unexpected weight change.  HENT: Negative for congestion and sinus pressure.   Respiratory: Negative for cough, chest tightness and shortness of breath.   Cardiovascular: Negative for chest pain, palpitations and leg swelling.   Gastrointestinal: Positive for abdominal pain and vomiting. Negative for diarrhea.  Genitourinary: Negative for difficulty urinating and dysuria.  Musculoskeletal: Negative for joint swelling and myalgias.  Skin: Negative for color change and rash.  Neurological: Negative for dizziness, light-headedness and headaches.  Psychiatric/Behavioral: Negative for agitation and dysphoric mood.       Objective:    Physical Exam Vitals reviewed.  Constitutional:      General: She is not in acute distress.    Appearance: Normal appearance.  HENT:     Head: Normocephalic and atraumatic.     Right Ear: External ear normal.     Left Ear: External ear normal.     Mouth/Throat:     Mouth: Oropharynx is clear and moist.  Eyes:     General: No scleral icterus.       Right eye: No discharge.        Left eye: No discharge.     Conjunctiva/sclera: Conjunctivae normal.  Neck:     Thyroid: No thyromegaly.  Cardiovascular:     Rate and Rhythm: Normal rate and regular rhythm.  Pulmonary:     Effort: No respiratory distress.     Breath sounds: Normal breath sounds. No wheezing.  Abdominal:     General: Bowel sounds are normal.     Palpations: Abdomen is soft.     Tenderness: There is no abdominal tenderness.  Musculoskeletal:        General: No swelling, tenderness or edema.     Cervical back: Neck supple. No tenderness.  Lymphadenopathy:     Cervical: No cervical adenopathy.  Skin:    Findings: No erythema or rash.  Neurological:     Mental Status: She is alert.  Psychiatric:        Mood and Affect: Mood normal.        Behavior: Behavior normal.     BP 122/70   Pulse 74   Temp 97.9 F (36.6 C) (Oral)   Resp 16   Ht $R'5\' 4"'jy$  (1.626 m)   Wt 151 lb 3.2 oz (68.6 kg)   SpO2 98%   BMI 25.95 kg/m  Wt Readings from Last 3 Encounters:  01/28/21 151 lb 3.2 oz (68.6 kg)  01/04/21 148 lb (67.1 kg)  09/26/20 147 lb (66.7 kg)     Lab Results  Component Value Date   WBC 6.1 01/28/2021    HGB 14.0 01/28/2021   HCT 42.4 01/28/2021   PLT 283.0 01/28/2021   GLUCOSE 124 (H) 01/24/2021   CHOL 215 (H) 01/24/2021   TRIG 139.0 01/24/2021   HDL 54.00 01/24/2021   LDLDIRECT 150.0 09/24/2020   LDLCALC 133 (H) 01/24/2021   ALT 25 01/24/2021  AST 18 01/24/2021   NA 138 01/24/2021   K 4.1 01/24/2021   CL 102 01/24/2021   CREATININE 0.72 01/24/2021   BUN 19 01/24/2021   CO2 30 01/24/2021   TSH 1.67 01/28/2021   HGBA1C 6.4 01/24/2021   MICROALBUR 1.1 03/09/2015       Assessment & Plan:   Problem List Items Addressed This Visit    Abdominal pain    Abdominal discomfort and emesis as outlined.  Discussed stress.  Check labs as outlined.  Has seen GI.  Planning for EGD and colonoscopy.        Fatigue    Increased fatigue.  May be multifactorial.  Increased stress.  Check cbc, iron studies, B12 and tsh.        Relevant Orders   TSH (Completed)   Hypercholesterolemia    On mevacor.  Low cholesterol diet and exercise.  Follow lipid panel and liver function tests.        Relevant Medications   lovastatin (MEVACOR) 40 MG tablet   Hyperglycemia    Low carb diet and exercise.  Follow met b and a1c.        Hypertension    Continue losartan, hctz and amlodipine.  Blood pressure as outlined.  Follow pressures.  Follow metabolic panel.       Relevant Medications   lovastatin (MEVACOR) 40 MG tablet   Restless legs    Describes restless legs as outlined.  Check cbc and iron studies.        Relevant Orders   CBC with Differential/Platelet (Completed)   Vitamin B12 (Completed)   IBC + Ferritin (Completed)   Stress    Increased stress as outlined.  Discussed. Does not feel needs any further intervention at this time.  Follow.            Einar Pheasant, MD

## 2021-01-31 ENCOUNTER — Other Ambulatory Visit: Payer: Self-pay | Admitting: Internal Medicine

## 2021-02-06 DIAGNOSIS — Z23 Encounter for immunization: Secondary | ICD-10-CM | POA: Diagnosis not present

## 2021-02-10 ENCOUNTER — Encounter: Payer: Self-pay | Admitting: Internal Medicine

## 2021-02-10 DIAGNOSIS — R109 Unspecified abdominal pain: Secondary | ICD-10-CM | POA: Insufficient documentation

## 2021-02-10 NOTE — Assessment & Plan Note (Signed)
Increased stress as outlined.  Discussed.  Does not feel needs any further intervention at this time.  Follow.  

## 2021-02-10 NOTE — Assessment & Plan Note (Signed)
On mevacor.  Low cholesterol diet and exercise.  Follow lipid panel and liver function tests.  

## 2021-02-10 NOTE — Assessment & Plan Note (Signed)
Low carb diet and exercise.  Follow met b and a1c.   

## 2021-02-10 NOTE — Assessment & Plan Note (Signed)
Abdominal discomfort and emesis as outlined.  Discussed stress.  Check labs as outlined.  Has seen GI.  Planning for EGD and colonoscopy.

## 2021-02-10 NOTE — Assessment & Plan Note (Signed)
Increased fatigue.  May be multifactorial.  Increased stress.  Check cbc, iron studies, B12 and tsh.

## 2021-02-10 NOTE — Assessment & Plan Note (Signed)
Continue losartan, hctz and amlodipine.  Blood pressure as outlined.  Follow pressures.  Follow metabolic panel.

## 2021-02-10 NOTE — Assessment & Plan Note (Signed)
Describes restless legs as outlined.  Check cbc and iron studies.

## 2021-02-11 ENCOUNTER — Other Ambulatory Visit: Payer: Self-pay

## 2021-02-11 ENCOUNTER — Other Ambulatory Visit
Admission: RE | Admit: 2021-02-11 | Discharge: 2021-02-11 | Disposition: A | Discharge status: Home / Self Care | Payer: 59 | Source: Ambulatory Visit | Attending: General Surgery | Admitting: General Surgery

## 2021-02-11 DIAGNOSIS — Z01812 Encounter for preprocedural laboratory examination: Secondary | ICD-10-CM | POA: Insufficient documentation

## 2021-02-11 DIAGNOSIS — Z20822 Contact with and (suspected) exposure to covid-19: Secondary | ICD-10-CM | POA: Insufficient documentation

## 2021-02-11 LAB — SARS CORONAVIRUS 2 (TAT 6-24 HRS): SARS Coronavirus 2: NEGATIVE

## 2021-02-12 ENCOUNTER — Encounter: Payer: Self-pay | Admitting: General Surgery

## 2021-02-13 ENCOUNTER — Ambulatory Visit
Admission: RE | Admit: 2021-02-13 | Discharge: 2021-02-13 | Disposition: A | Discharge status: Home / Self Care | Payer: 59 | Attending: General Surgery | Admitting: General Surgery

## 2021-02-13 ENCOUNTER — Ambulatory Visit: Payer: 59 | Admitting: Registered Nurse

## 2021-02-13 ENCOUNTER — Other Ambulatory Visit: Payer: Self-pay

## 2021-02-13 ENCOUNTER — Encounter
Admission: RE | Disposition: A | Discharge status: Home / Self Care | Payer: Self-pay | Source: Home / Self Care | Attending: General Surgery

## 2021-02-13 DIAGNOSIS — Z8371 Family history of colonic polyps: Secondary | ICD-10-CM | POA: Diagnosis not present

## 2021-02-13 DIAGNOSIS — Z79899 Other long term (current) drug therapy: Secondary | ICD-10-CM | POA: Diagnosis not present

## 2021-02-13 DIAGNOSIS — K219 Gastro-esophageal reflux disease without esophagitis: Secondary | ICD-10-CM | POA: Diagnosis not present

## 2021-02-13 DIAGNOSIS — E7800 Pure hypercholesterolemia, unspecified: Secondary | ICD-10-CM | POA: Diagnosis not present

## 2021-02-13 DIAGNOSIS — E1143 Type 2 diabetes mellitus with diabetic autonomic (poly)neuropathy: Secondary | ICD-10-CM | POA: Insufficient documentation

## 2021-02-13 DIAGNOSIS — K3184 Gastroparesis: Secondary | ICD-10-CM | POA: Diagnosis not present

## 2021-02-13 DIAGNOSIS — Z1211 Encounter for screening for malignant neoplasm of colon: Secondary | ICD-10-CM | POA: Diagnosis not present

## 2021-02-13 DIAGNOSIS — Z880 Allergy status to penicillin: Secondary | ICD-10-CM | POA: Insufficient documentation

## 2021-02-13 DIAGNOSIS — R1013 Epigastric pain: Secondary | ICD-10-CM | POA: Insufficient documentation

## 2021-02-13 DIAGNOSIS — I10 Essential (primary) hypertension: Secondary | ICD-10-CM | POA: Diagnosis not present

## 2021-02-13 HISTORY — PX: ESOPHAGOGASTRODUODENOSCOPY (EGD) WITH PROPOFOL: SHX5813

## 2021-02-13 HISTORY — DX: Type 2 diabetes mellitus without complications: E11.9

## 2021-02-13 HISTORY — PX: COLONOSCOPY WITH PROPOFOL: SHX5780

## 2021-02-13 LAB — GLUCOSE, CAPILLARY: Glucose-Capillary: 129 mg/dL — ABNORMAL HIGH (ref 70–99)

## 2021-02-13 SURGERY — COLONOSCOPY WITH PROPOFOL
Anesthesia: General

## 2021-02-13 MED ORDER — GLYCOPYRROLATE 0.2 MG/ML IJ SOLN
INTRAMUSCULAR | Status: DC | PRN
  Administered 2021-02-13: .2 mg via INTRAVENOUS

## 2021-02-13 MED ORDER — PROPOFOL 10 MG/ML IV BOLUS
INTRAVENOUS | Status: DC | PRN
  Administered 2021-02-13: 30 mg via INTRAVENOUS
  Administered 2021-02-13: 60 mg via INTRAVENOUS

## 2021-02-13 MED ORDER — SODIUM CHLORIDE 0.9 % IV SOLN
INTRAVENOUS | Status: DC
  Administered 2021-02-13: 20 mL/h via INTRAVENOUS

## 2021-02-13 MED ORDER — LIDOCAINE HCL (CARDIAC) PF 100 MG/5ML IV SOSY
PREFILLED_SYRINGE | INTRAVENOUS | Status: DC | PRN
  Administered 2021-02-13: 100 mg via INTRAVENOUS

## 2021-02-13 MED ORDER — GLYCOPYRROLATE 0.2 MG/ML IJ SOLN
INTRAMUSCULAR | Status: AC
  Filled 2021-02-13: qty 1

## 2021-02-13 MED ORDER — PROPOFOL 500 MG/50ML IV EMUL
INTRAVENOUS | Status: DC | PRN
  Administered 2021-02-13: 150 ug/kg/min via INTRAVENOUS

## 2021-02-13 MED ORDER — DEXMEDETOMIDINE (PRECEDEX) IN NS 20 MCG/5ML (4 MCG/ML) IV SYRINGE
PREFILLED_SYRINGE | INTRAVENOUS | Status: AC
  Filled 2021-02-13: qty 5

## 2021-02-13 MED ORDER — DEXMEDETOMIDINE HCL 200 MCG/2ML IV SOLN
INTRAVENOUS | Status: DC | PRN
  Administered 2021-02-13: 16 ug via INTRAVENOUS

## 2021-02-13 NOTE — Anesthesia Preprocedure Evaluation (Signed)
Anesthesia Evaluation  Patient identified by MRN, date of birth, ID band Patient awake    Reviewed: Allergy & Precautions, H&P , NPO status , Patient's Chart, lab work & pertinent test results  Airway Mallampati: II  TM Distance: >3 FB Neck ROM: Full    Dental no notable dental hx.    Pulmonary neg pulmonary ROS,    Pulmonary exam normal        Cardiovascular hypertension, Pt. on medications negative cardio ROS Normal cardiovascular exam     Neuro/Psych negative neurological ROS  negative psych ROS   GI/Hepatic negative GI ROS, Neg liver ROS, Bowel prep,GERD  Controlled,  Endo/Other  negative endocrine ROSdiabetes, Well Controlled  Renal/GU negative Renal ROS  negative genitourinary   Musculoskeletal negative musculoskeletal ROS (+)   Abdominal   Peds negative pediatric ROS (+)  Hematology negative hematology ROS (+)   Anesthesia Other Findings Abdominal pain     Abdominal discomfort and emesis as outlined.  Discussed stress.  Check labs as outlined.  Has seen GI.  Planning for EGD and colonoscopy.      Fatigue    Increased fatigue.  May be multifactorial.  Increased stress.  Check cbc, iron studies, B12 and tsh.      Relevant Orders  TSH (Completed)  Hypercholesterolemia    On mevacor.  Low cholesterol diet and exercise.  Follow lipid panel and liver function tests.      Relevant Medications  lovastatin (MEVACOR) 40 MG tablet  Hyperglycemia    Low carb diet and exercise.  Follow met b and a1c.      Hypertension    Continue losartan, hctz and amlodipine.  Blood pressure as outlined.  Follow pressures.  Follow metabolic panel.     Relevant Medications  lovastatin (MEVACOR) 40 MG tablet Restless legs  Reproductive/Obstetrics negative OB ROS                             Anesthesia Physical Anesthesia Plan  ASA: II  Anesthesia Plan: General    Post-op Pain Management:    Induction: Intravenous  PONV Risk Score and Plan: 2 and Propofol infusion and TIVA  Airway Management Planned: Natural Airway and Nasal Cannula  Additional Equipment:   Intra-op Plan:   Post-operative Plan:   Informed Consent: I have reviewed the patients History and Physical, chart, labs and discussed the procedure including the risks, benefits and alternatives for the proposed anesthesia with the patient or authorized representative who has indicated his/her understanding and acceptance.       Plan Discussed with: CRNA, Anesthesiologist and Surgeon  Anesthesia Plan Comments:         Anesthesia Quick Evaluation

## 2021-02-13 NOTE — Transfer of Care (Signed)
Immediate Anesthesia Transfer of Care Note  Patient: PECOLA HAXTON  Procedure(s) Performed: COLONOSCOPY WITH PROPOFOL (N/A ) ESOPHAGOGASTRODUODENOSCOPY (EGD) WITH PROPOFOL (N/A )  Patient Location: Endoscopy Unit  Anesthesia Type:General  Level of Consciousness: drowsy  Airway & Oxygen Therapy: Patient Spontanous Breathing  Post-op Assessment: Report given to RN and Post -op Vital signs reviewed and stable  Post vital signs: Reviewed and stable  Last Vitals:  Vitals Value Taken Time  BP 120/89 02/13/21 0850  Temp 36.6 C 02/13/21 0849  Pulse 78 02/13/21 0850  Resp 13 02/13/21 0850  SpO2 100 % 02/13/21 0850  Vitals shown include unvalidated device data.  Last Pain:  Vitals:   02/13/21 0849  TempSrc: Temporal  PainSc:          Complications: No complications documented.

## 2021-02-13 NOTE — Op Note (Addendum)
[facility] Gastroenterology Patient Name: Rachel Collins Procedure Date: 02/13/2021 8:11 AM MRN: [id number] Account #: [id number] Date of Birth: 01-15-1961 Admit Type: Outpatient Age: 60 Room: [facility] ENDO ROOM 1 Gender: Female Note Status: Supervisor Override Procedure:             Colonoscopy Indications:           Screening for colorectal malignant neoplasm, Family                         history of colon polyps Providers:             Earline Mayotte, MD Referring MD:          Dale Durant, MD (Referring MD) Medicines:             Monitored Anesthesia Care Complications:         No immediate complications. Procedure:             Pre-Anesthesia Assessment:                        - Prior to the procedure, a History and Physical was                         performed, and patient medications, allergies and                         sensitivities were reviewed. The patient's tolerance                         of previous anesthesia was reviewed.                        - The risks and benefits of the procedure and the                         sedation options and risks were discussed with the                         patient. All questions were answered and informed                         consent was obtained.                        After obtaining informed consent, the colonoscope was                         passed under direct vision. Throughout the procedure,                         the patient's blood pressure, pulse, and oxygen                         saturations were monitored continuously. The                         Colonoscope was introduced through the anus and                         advanced to the the cecum,  identified by appendiceal                         orifice and ileocecal valve. The colonoscopy was                         performed without difficulty. The patient tolerated                         the procedure well. The quality of the bowel                          preparation was excellent. Findings:      The entire examined colon appeared normal on direct and retroflexion       views. Impression:            - The entire examined colon is normal on direct and                         retroflexion views.                        - No specimens collected. Recommendation:        - Repeat colonoscopy in 10 years for screening                         purposes. Procedure Code(s):     --- Professional ---                        [phone], Colonoscopy, flexible; diagnostic, including                         collection of specimen(s) by brushing or washing, when                         performed (separate procedure) Diagnosis Code(s):     --- Professional ---                        Z12.11, Encounter for screening for malignant neoplasm                         of colon CPT copyright 2019 American Medical Association. All rights reserved. The codes documented in this report are preliminary and upon coder review may  be revised to meet current compliance requirements. Earline Mayotte, MD 02/13/2021 8:46:45 AM This report has been signed electronically. Number of Addenda: 0 Note Initiated On: 02/13/2021 8:11 AM Scope Withdrawal Time: 0 hours 6 minutes 52 seconds  Total Procedure Duration: 0 hours 10 minutes 24 seconds       [facility]

## 2021-02-13 NOTE — H&P (Signed)
Rachel Collins [id number] 1961-08-24     HPI:  Healthy 60 y/o woman with a family history of colon polyps (father) for screening colonoscopy. Recent epigastric pain with some radiation to the back. Improvement with PPI.  For EGD.  Non-smoker, occasional white wine.   Medications Prior to Admission  Medication Sig Dispense Refill Last Dose  . amLODipine (NORVASC) 5 MG tablet TAKE 1 TABLET BY MOUTH EVERY DAY 30 tablet 5 02/13/2021 at 0600  . hydrochlorothiazide (HYDRODIURIL) 25 MG tablet TAKE 1 TABLET BY MOUTH EVERY DAY 30 tablet 5 02/13/2021 at 0600  . losartan (COZAAR) 100 MG tablet TAKE 1 TABLET BY MOUTH EVERY DAY 30 tablet 5 02/13/2021 at 0600  . lovastatin (MEVACOR) 40 MG tablet Take 1 tablet (40 mg total) by mouth at bedtime. 90 tablet 1 02/13/2021 at 0600  . nitrofurantoin, macrocrystal-monohydrate, (MACROBID) 100 MG capsule Take 1 capsule (100 mg total) by mouth 2 (two) times daily. 10 capsule 0 Past Week at Unknown time  . phenazopyridine (PYRIDIUM) 200 MG tablet Take 1 tablet (200 mg total) by mouth 3 (three) times daily as needed for pain. 6 tablet 0 Past Week at Unknown time  . sertraline (ZOLOFT) 50 MG tablet TAKE 1 AND 1/2 TABLETS BY MOUTH DAILY 45 tablet 5 Past Week at Unknown time  . traZODone (DESYREL) 50 MG tablet TAKE 1-2 TABLETS BY MOUTH AT BEDTIME. 180 tablet 1 Past Week at Unknown time   Allergies  Allergen Reactions  . Penicillins Rash   Past Medical History:  Diagnosis Date  . Diabetes mellitus without complication (HCC)   . Esophagitis   . Gastritis   . Gastroparesis   . History of migraine headaches   . Hypercholesterolemia   . Hyperglycemia   . Hypertension   . Paget's bone disease    Past Surgical History:  Procedure Laterality Date  . BREAST BIOPSY Left 2010   benign  . CERVICAL DISC SURGERY     C5-C7 fusion  . TUBAL LIGATION     Social History   Socioeconomic History  . Marital status: Married    Spouse name: Not on file  . Number of children: 1   . Years of education: Not on file  . Highest education level: Not on file  Occupational History  . Not on file  Tobacco Use  . Smoking status: Never Smoker  . Smokeless tobacco: Never Used  Vaping Use  . Vaping Use: Never used  Substance and Sexual Activity  . Alcohol use: Yes    Alcohol/week: 7.0 standard drinks    Types: 7 Glasses of wine per week    Comment: 1 glass with dinner  . Drug use: No  . Sexual activity: Not on file  Other Topics Concern  . Not on file  Social History Narrative  . Not on file   Social Determinants of Health   Financial Resource Strain: Not on file  Food Insecurity: Not on file  Transportation Needs: Not on file  Physical Activity: Not on file  Stress: Not on file  Social Connections: Not on file  Intimate Partner Violence: Not on file   Social History   Social History Narrative  . Not on file     ROS: Negative.     PE: HEENT: Negative. Lungs: Clear. Cardio: RR.    Assessment/Plan:  Proceed with planned upper and lower endoscopy.   [facility] 02/13/2021

## 2021-02-13 NOTE — Op Note (Signed)
[facility] Gastroenterology Patient Name: Rachel Collins Procedure Date: 02/13/2021 8:11 AM MRN: [id number] Account #: [id number] Date of Birth: March 10, 1961 Admit Type: Outpatient Age: 60 Room: [facility] ENDO ROOM 1 Gender: Female Note Status: Finalized Procedure:             Upper GI endoscopy Indications:           Epigastric abdominal pain Providers:             Earline Mayotte, MD Referring MD:          Dale Marvell, MD (Referring MD) Medicines:             Monitored Anesthesia Care Complications:         No immediate complications. Procedure:             Pre-Anesthesia Assessment:                        - Prior to the procedure, a History and Physical was                         performed, and patient medications, allergies and                         sensitivities were reviewed. The patient's tolerance                         of previous anesthesia was reviewed.                        - The risks and benefits of the procedure and the                         sedation options and risks were discussed with the                         patient. All questions were answered and informed                         consent was obtained.                        After obtaining informed consent, the endoscope was                         passed under direct vision. Throughout the procedure,                         the patient's blood pressure, pulse, and oxygen                         saturations were monitored continuously. The Endoscope                         was introduced through the mouth, and advanced to the                         second part of duodenum. The upper GI endoscopy was  accomplished without difficulty. The patient tolerated                         the procedure well. Findings:      The esophagus was normal.      The stomach was normal.      The examined duodenum was normal. Impression:            - Normal esophagus.                         - Normal stomach.                        - Normal examined duodenum.                        - No specimens collected. Recommendation:        - Perform a colonoscopy today. Procedure Code(s):     --- Professional ---                        [phone], Esophagogastroduodenoscopy, flexible,                         transoral; diagnostic, including collection of                         specimen(s) by brushing or washing, when performed                         (separate procedure) Diagnosis Code(s):     --- Professional ---                        R10.13, Epigastric pain CPT copyright 2019 American Medical Association. All rights reserved. The codes documented in this report are preliminary and upon coder review may  be revised to meet current compliance requirements. Earline Mayotte, MD 02/13/2021 8:32:42 AM This report has been signed electronically. Number of Addenda: 0 Note Initiated On: 02/13/2021 8:11 AM Estimated Blood Loss:  Estimated blood loss: none.      [facility]

## 2021-02-13 NOTE — Anesthesia Postprocedure Evaluation (Signed)
Anesthesia Post Note  Patient: Rachel Collins  Procedure(s) Performed: COLONOSCOPY WITH PROPOFOL (N/A ) ESOPHAGOGASTRODUODENOSCOPY (EGD) WITH PROPOFOL (N/A )  Patient location during evaluation: Phase II Anesthesia Type: General Level of consciousness: awake and alert, awake and oriented Pain management: pain level controlled Vital Signs Assessment: post-procedure vital signs reviewed and stable Respiratory status: spontaneous breathing, nonlabored ventilation and respiratory function stable Cardiovascular status: blood pressure returned to baseline and stable Postop Assessment: no apparent nausea or vomiting Anesthetic complications: no   No complications documented.   Last Vitals:  Vitals:   02/13/21 0849 02/13/21 0909  BP:  (!) 152/81  Pulse:    Resp:    Temp: 36.6 C   SpO2:      Last Pain:  Vitals:   02/13/21 0909  TempSrc:   PainSc: 0-No pain                 Manfred Arch

## 2021-02-14 ENCOUNTER — Encounter: Payer: Self-pay | Admitting: General Surgery

## 2021-03-02 ENCOUNTER — Other Ambulatory Visit: Payer: Self-pay | Admitting: Internal Medicine

## 2021-04-15 ENCOUNTER — Ambulatory Visit: Payer: 59 | Admitting: Internal Medicine

## 2021-04-15 DIAGNOSIS — Z0289 Encounter for other administrative examinations: Secondary | ICD-10-CM

## 2021-05-02 ENCOUNTER — Other Ambulatory Visit: Payer: Self-pay | Admitting: Internal Medicine

## 2021-05-15 ENCOUNTER — Other Ambulatory Visit: Payer: Self-pay | Admitting: Internal Medicine

## 2021-05-21 ENCOUNTER — Encounter: Payer: Self-pay | Admitting: Internal Medicine

## 2021-06-14 ENCOUNTER — Other Ambulatory Visit: Payer: Self-pay

## 2021-06-14 ENCOUNTER — Ambulatory Visit: Payer: Managed Care, Other (non HMO) | Admitting: Internal Medicine

## 2021-06-14 ENCOUNTER — Encounter: Payer: Self-pay | Admitting: Internal Medicine

## 2021-06-14 DIAGNOSIS — F439 Reaction to severe stress, unspecified: Secondary | ICD-10-CM | POA: Diagnosis not present

## 2021-06-14 DIAGNOSIS — G479 Sleep disorder, unspecified: Secondary | ICD-10-CM

## 2021-06-14 DIAGNOSIS — R400 Somnolence: Secondary | ICD-10-CM

## 2021-06-14 DIAGNOSIS — R739 Hyperglycemia, unspecified: Secondary | ICD-10-CM

## 2021-06-14 DIAGNOSIS — I10 Essential (primary) hypertension: Secondary | ICD-10-CM

## 2021-06-14 DIAGNOSIS — R5383 Other fatigue: Secondary | ICD-10-CM

## 2021-06-14 DIAGNOSIS — M889 Osteitis deformans of unspecified bone: Secondary | ICD-10-CM

## 2021-06-14 DIAGNOSIS — J3489 Other specified disorders of nose and nasal sinuses: Secondary | ICD-10-CM

## 2021-06-14 DIAGNOSIS — E7800 Pure hypercholesterolemia, unspecified: Secondary | ICD-10-CM

## 2021-06-14 DIAGNOSIS — R002 Palpitations: Secondary | ICD-10-CM

## 2021-06-14 NOTE — Progress Notes (Signed)
Patient ID: Rachel Collins, female   DOB: 01-13-1961, 60 y.o.   MRN: [id number]   Subjective:    Patient ID: Rachel Collins, female    DOB: 1961/01/26, 60 y.o.   MRN: [id number]  HPI This visit occurred during the SARS-CoV-2 public health emergency.  Safety protocols were in place, including screening questions prior to the visit, additional usage of staff PPE, and extensive cleaning of exam room while observing appropriate contact time as indicated for disinfecting solutions.   Patient here for a scheduled follow up. Here to follow up regarding her cholesterol and blood pressure.  She report increased fatigue.  Does not feel rested when wakes up.  Takes trazodone.  Describes crazy dreams.  Does snore.  Increased snoring.  Left eye watering.  Left - nasal congestion - when lying on left side.  Drains when rolls over.  No chest pain or sob reported.  No increased cough or congestion.  No abdominal pain.  Bowels moving. Increased stress with work.   Past Medical History:  Diagnosis Date   Diabetes mellitus without complication (HCC)    Esophagitis    Gastritis    Gastroparesis    History of migraine headaches    Hypercholesterolemia    Hyperglycemia    Hypertension    Paget's bone disease    Past Surgical History:  Procedure Laterality Date   BREAST BIOPSY Left 2010   benign   CERVICAL DISC SURGERY     C5-C7 fusion   COLONOSCOPY WITH PROPOFOL N/A 02/13/2021   Procedure: COLONOSCOPY WITH PROPOFOL;  Surgeon: Robert Bellow, MD;  Location: [place];  Service: Endoscopy;  Laterality: N/A;   ESOPHAGOGASTRODUODENOSCOPY (EGD) WITH PROPOFOL N/A 02/13/2021   Procedure: ESOPHAGOGASTRODUODENOSCOPY (EGD) WITH PROPOFOL;  Surgeon: Robert Bellow, MD;  Location: [facility] ENDOSCOPY;  Service: Endoscopy;  Laterality: N/A;   TUBAL LIGATION     Family History  Problem Relation Age of Onset   Heart disease Father        myocardial infarction x 5   Prostate cancer Father    Uterine cancer  Mother    COPD Mother    Diabetes Mother    Heart disease Mother        myocardial infarction   Breast cancer Mother 36   Heart disease Paternal Grandmother    Heart disease Maternal Grandfather    Social History   Socioeconomic History   Marital status: Married    Spouse name: Not on file   Number of children: 1   Years of education: Not on file   Highest education level: Not on file  Occupational History   Not on file  Tobacco Use   Smoking status: Never   Smokeless tobacco: Never  Vaping Use   Vaping Use: Never used  Substance and Sexual Activity   Alcohol use: Yes    Alcohol/week: 7.0 standard drinks    Types: 7 Glasses of wine per week    Comment: 1 glass with dinner   Drug use: No   Sexual activity: Not on file  Other Topics Concern   Not on file  Social History Narrative   Not on file   Social Determinants of Health   Financial Resource Strain: Not on file  Food Insecurity: Not on file  Transportation Needs: Not on file  Physical Activity: Not on file  Stress: Not on file  Social Connections: Not on file    Review of Systems  Constitutional:  Negative for appetite change and unexpected  weight change.  HENT:  Negative for sinus pressure.        Left eye watering.  No sinus pressure.    Respiratory:  Negative for cough, chest tightness and shortness of breath.   Cardiovascular:  Negative for chest pain and palpitations.  Gastrointestinal:  Negative for abdominal pain, diarrhea, nausea and vomiting.  Genitourinary:  Negative for difficulty urinating and dysuria.  Musculoskeletal:  Negative for joint swelling and myalgias.  Skin:  Negative for color change and rash.  Neurological:  Negative for dizziness, light-headedness and headaches.  Psychiatric/Behavioral:  Negative for agitation and dysphoric mood.       Objective:    Physical Exam Vitals reviewed.  Constitutional:      General: She is not in acute distress.    Appearance: Normal appearance.   HENT:     Head: Normocephalic and atraumatic.     Right Ear: External ear normal.     Left Ear: External ear normal.  Eyes:     General: No scleral icterus.       Right eye: No discharge.        Left eye: No discharge.     Conjunctiva/sclera: Conjunctivae normal.  Neck:     Thyroid: No thyromegaly.  Cardiovascular:     Rate and Rhythm: Normal rate and regular rhythm.  Pulmonary:     Effort: No respiratory distress.     Breath sounds: Normal breath sounds. No wheezing.  Abdominal:     General: Bowel sounds are normal.     Palpations: Abdomen is soft.     Tenderness: There is no abdominal tenderness.  Musculoskeletal:        General: No swelling or tenderness.     Cervical back: Neck supple. No tenderness.  Lymphadenopathy:     Cervical: No cervical adenopathy.  Skin:    Findings: No erythema or rash.  Neurological:     Mental Status: She is alert.  Psychiatric:        Mood and Affect: Mood normal.        Behavior: Behavior normal.    BP 118/60 (BP Location: Left Arm, Patient Position: Sitting, Cuff Size: Normal)   Pulse 80   Temp 98.2 F (36.8 C) (Oral)   Ht $R'5\' 4"'UF$  (1.626 m)   Wt 151 lb 3.2 oz (68.6 kg)   LMP  (LMP Unknown)   SpO2 97%   BMI 25.95 kg/m  Wt Readings from Last 3 Encounters:  06/14/21 151 lb 3.2 oz (68.6 kg)  02/13/21 148 lb (67.1 kg)  01/28/21 151 lb 3.2 oz (68.6 kg)    Outpatient Encounter Medications as of 06/14/2021  Medication Sig   amLODipine (NORVASC) 5 MG tablet TAKE 1 TABLET BY MOUTH EVERY DAY   hydrochlorothiazide (HYDRODIURIL) 25 MG tablet TAKE 1 TABLET BY MOUTH EVERY DAY   losartan (COZAAR) 100 MG tablet TAKE 1 TABLET BY MOUTH EVERY DAY   lovastatin (MEVACOR) 40 MG tablet TAKE 1 TABLET BY MOUTH EVERYDAY AT BEDTIME   sertraline (ZOLOFT) 50 MG tablet TAKE 1 AND 1/2 TABLETS BY MOUTH DAILY   traZODone (DESYREL) 50 MG tablet TAKE 1 TO 2 TABLETS BY MOUTH AT BEDTIME   [DISCONTINUED] nitrofurantoin, macrocrystal-monohydrate, (MACROBID) 100 MG  capsule Take 1 capsule (100 mg total) by mouth 2 (two) times daily. (Patient not taking: Reported on 06/14/2021)   [DISCONTINUED] phenazopyridine (PYRIDIUM) 200 MG tablet Take 1 tablet (200 mg total) by mouth 3 (three) times daily as needed for pain. (Patient not taking: Reported on  06/14/2021)   No facility-administered encounter medications on file as of 06/14/2021.     Lab Results  Component Value Date   WBC 6.1 01/28/2021   HGB 14.0 01/28/2021   HCT 42.4 01/28/2021   PLT 283.0 01/28/2021   GLUCOSE 124 (H) 01/24/2021   CHOL 215 (H) 01/24/2021   TRIG 139.0 01/24/2021   HDL 54.00 01/24/2021   LDLDIRECT 150.0 09/24/2020   LDLCALC 133 (H) 01/24/2021   ALT 25 01/24/2021   AST 18 01/24/2021   NA 138 01/24/2021   K 4.1 01/24/2021   CL 102 01/24/2021   CREATININE 0.72 01/24/2021   BUN 19 01/24/2021   CO2 30 01/24/2021   TSH 1.67 01/28/2021   HGBA1C 6.4 01/24/2021   MICROALBUR 1.1 03/09/2015       Assessment & Plan:   Problem List Items Addressed This Visit     Daytime somnolence    See above.  Refer to pulmonary for further evaluation and question of need for home sleep study.        Relevant Orders   Ambulatory referral to Pulmonology   Difficulty sleeping    Has been on trazodone.  Discussed.  Does not feel rested when wakes up in am.  Daytime somnolence and fatigue.  Snoring.  Refer to pulmonary for further evaluation.       Relevant Orders   Ambulatory referral to Pulmonology   Fatigue    Increased fatigue and daytime somnolence.  Does not feel rested when wakes up in am.  Discussed possible sleep apnea.  Refer to pulmonary for further w/up and evaluation.        Hypercholesterolemia    On mevacor.  Low cholesterol diet and exercise.  Follow lipid panel and liver function tests.         Relevant Orders   Hepatic function panel   Lipid panel   Hyperglycemia    Low carb diet and exercise.  Follow met b and a1c.        Relevant Orders   Hemoglobin A1c    Hypertension    Blood pressure has been under good control.  Continue losartan, hctz and amlodipine.  Follow pressures.  Follow metabolic panel.        Relevant Orders   Basic metabolic panel   Nasal drainage    flonase prior to bed.  Follow.        Paget disease of bone    Has been evaluated by endocrinology.  Recommended caclium and vitamin D.        Palpitations   Relevant Orders   EKG 12-Lead (Completed)   Stress    Increased stress as outlined.  Discussed. Does not feel needs any further intervention at this time.  Follow.           Einar Pheasant, MD

## 2021-06-15 ENCOUNTER — Encounter: Payer: Self-pay | Admitting: Internal Medicine

## 2021-06-15 NOTE — Assessment & Plan Note (Signed)
On mevacor.  Low cholesterol diet and exercise.  Follow lipid panel and liver function tests.  

## 2021-06-15 NOTE — Assessment & Plan Note (Signed)
Blood pressure has been under good control.  Continue losartan, hctz and amlodipine.  Follow pressures.  Follow metabolic panel.  

## 2021-06-15 NOTE — Assessment & Plan Note (Signed)
Has been on trazodone.  Discussed.  Does not feel rested when wakes up in am.  Daytime somnolence and fatigue.  Snoring.  Refer to pulmonary for further evaluation.

## 2021-06-15 NOTE — Assessment & Plan Note (Signed)
flonase prior to bed.  Follow.

## 2021-06-15 NOTE — Assessment & Plan Note (Signed)
Increased fatigue and daytime somnolence.  Does not feel rested when wakes up in am.  Discussed possible sleep apnea.  Refer to pulmonary for further w/up and evaluation.

## 2021-06-15 NOTE — Assessment & Plan Note (Signed)
See above.  Refer to pulmonary for further evaluation and question of need for home sleep study.

## 2021-06-15 NOTE — Assessment & Plan Note (Signed)
Low carb diet and exercise.  Follow met b and a1c.  

## 2021-06-15 NOTE — Assessment & Plan Note (Signed)
Has been evaluated by endocrinology.  Recommended caclium and vitamin D.  

## 2021-06-15 NOTE — Assessment & Plan Note (Signed)
Increased stress as outlined.  Discussed.  Does not feel needs any further intervention at this time.  Follow.  

## 2021-07-01 ENCOUNTER — Other Ambulatory Visit: Payer: Managed Care, Other (non HMO)

## 2021-07-16 ENCOUNTER — Other Ambulatory Visit: Payer: Managed Care, Other (non HMO)

## 2021-08-29 ENCOUNTER — Other Ambulatory Visit: Payer: Self-pay | Admitting: Internal Medicine

## 2021-09-29 ENCOUNTER — Other Ambulatory Visit: Payer: Self-pay | Admitting: Internal Medicine

## 2021-10-11 ENCOUNTER — Encounter: Payer: Managed Care, Other (non HMO) | Admitting: Internal Medicine

## 2021-10-14 ENCOUNTER — Other Ambulatory Visit: Payer: Self-pay

## 2021-10-14 ENCOUNTER — Ambulatory Visit (INDEPENDENT_AMBULATORY_CARE_PROVIDER_SITE_OTHER): Payer: Managed Care, Other (non HMO)

## 2021-10-14 DIAGNOSIS — Z23 Encounter for immunization: Secondary | ICD-10-CM | POA: Diagnosis not present

## 2021-10-23 ENCOUNTER — Other Ambulatory Visit: Payer: Self-pay | Admitting: Internal Medicine

## 2021-12-04 ENCOUNTER — Encounter: Payer: Self-pay | Admitting: Internal Medicine

## 2021-12-13 ENCOUNTER — Encounter: Payer: Managed Care, Other (non HMO) | Admitting: Internal Medicine

## 2021-12-19 ENCOUNTER — Other Ambulatory Visit: Payer: Self-pay | Admitting: Internal Medicine

## 2021-12-26 ENCOUNTER — Other Ambulatory Visit: Payer: Self-pay | Admitting: Internal Medicine

## 2022-02-06 ENCOUNTER — Emergency Department: Payer: Managed Care, Other (non HMO)

## 2022-02-06 ENCOUNTER — Emergency Department
Admission: EM | Admit: 2022-02-06 | Discharge: 2022-02-06 | Disposition: A | Discharge status: Home / Self Care | Payer: Managed Care, Other (non HMO) | Attending: Emergency Medicine | Admitting: Emergency Medicine

## 2022-02-06 ENCOUNTER — Telehealth: Payer: Self-pay | Admitting: Internal Medicine

## 2022-02-06 ENCOUNTER — Encounter: Payer: Self-pay | Admitting: Emergency Medicine

## 2022-02-06 ENCOUNTER — Other Ambulatory Visit: Payer: Self-pay

## 2022-02-06 DIAGNOSIS — R109 Unspecified abdominal pain: Secondary | ICD-10-CM | POA: Insufficient documentation

## 2022-02-06 DIAGNOSIS — I10 Essential (primary) hypertension: Secondary | ICD-10-CM | POA: Diagnosis not present

## 2022-02-06 DIAGNOSIS — F419 Anxiety disorder, unspecified: Secondary | ICD-10-CM | POA: Insufficient documentation

## 2022-02-06 DIAGNOSIS — E119 Type 2 diabetes mellitus without complications: Secondary | ICD-10-CM | POA: Insufficient documentation

## 2022-02-06 DIAGNOSIS — D72829 Elevated white blood cell count, unspecified: Secondary | ICD-10-CM | POA: Insufficient documentation

## 2022-02-06 DIAGNOSIS — R42 Dizziness and giddiness: Secondary | ICD-10-CM | POA: Insufficient documentation

## 2022-02-06 LAB — URINALYSIS, ROUTINE W REFLEX MICROSCOPIC
Bacteria, UA: NONE SEEN
Bilirubin Urine: NEGATIVE
Glucose, UA: NEGATIVE mg/dL
Ketones, ur: NEGATIVE mg/dL
Nitrite: NEGATIVE
Protein, ur: NEGATIVE mg/dL
Specific Gravity, Urine: 1.011 (ref 1.005–1.030)
pH: 6 (ref 5.0–8.0)

## 2022-02-06 LAB — CBC
HCT: 45.8 % (ref 36.0–46.0)
Hemoglobin: 14.7 g/dL (ref 12.0–15.0)
MCH: 29.3 pg (ref 26.0–34.0)
MCHC: 32.1 g/dL (ref 30.0–36.0)
MCV: 91.4 fL (ref 80.0–100.0)
Platelets: 319 10*3/uL (ref 150–400)
RBC: 5.01 MIL/uL (ref 3.87–5.11)
RDW: 13.3 % (ref 11.5–15.5)
WBC: 10.8 10*3/uL — ABNORMAL HIGH (ref 4.0–10.5)
nRBC: 0 % (ref 0.0–0.2)

## 2022-02-06 LAB — BASIC METABOLIC PANEL
Anion gap: 10 (ref 5–15)
BUN: 22 mg/dL (ref 8–23)
CO2: 27 mmol/L (ref 22–32)
Calcium: 9.7 mg/dL (ref 8.9–10.3)
Chloride: 99 mmol/L (ref 98–111)
Creatinine, Ser: 0.75 mg/dL (ref 0.44–1.00)
GFR, Estimated: >60 mL/min (ref >60)
Glucose, Bld: 148 mg/dL — ABNORMAL HIGH (ref 70–99)
Potassium: 3.5 mmol/L (ref 3.5–5.1)
Sodium: 136 mmol/L (ref 135–145)

## 2022-02-06 LAB — TROPONIN I (HIGH SENSITIVITY): Troponin I (High Sensitivity): 5 ng/L (ref <18)

## 2022-02-06 MED ORDER — SODIUM CHLORIDE 0.9 % IV BOLUS: 1000.0000 mL | Freq: Once | INTRAVENOUS | Status: DC

## 2022-02-06 NOTE — ED Triage Notes (Signed)
Pt comes into the ED via POV c/o HTN, dizziness, and blurred vision.  Pt had a BP today of 190/110.  Pt currently able to answer all questions appropriately.  Pt states he symptoms have been intermittent.  Pt does admit she had some chest discomfort this week as well.  Pt in NAD currently with even and unlabored respirations.

## 2022-02-06 NOTE — Discharge Instructions (Addendum)
Contact your primary care doctor further about further work-up of this but at this time your work-up was reassuring there is no evidence of heart attack or arrhythmia

## 2022-02-06 NOTE — Telephone Encounter (Signed)
Pt called in st.sating that she think her blood pressure is high. Pt stated that her blood pressure is reading 190/110. Pt stated that she get light headed and dizziness. Pt stated that the light head and dizziness come and goes. Pt stated that she thought it was her sugar but she doesn't have anything to test her sugar level with. No avail appt. Transfer Pt to Access Nurse

## 2022-02-06 NOTE — Telephone Encounter (Signed)
FYI- Patient has arrived at ED. Triage note attached below. Will follow up with patient.

## 2022-02-06 NOTE — ED Provider Notes (Signed)
[facility] Provider Note    Event Date/Time   First MD Initiated Contact with Patient 02/06/22 1657     (approximate)   History   Hypertension and Dizziness   HPI  Rachel Collins is a 61 y.o. female with hypertension , diabetes who comes in with concerns for elevated blood pressure.  Patient reports that she would have these episodes where she started to feel lightheaded and anxious and then she would check her blood pressure and be elevated. patient reports that she thought it was her sugar but this was normal. She has had some of these intermittent episodes where her blood pressure goes up. Episodes typically happen at work and then she feels heat on her face, blurred vision.  No SOB maybe a twinge in her chest.  The last time that she had any chest discomfort was on Monday or Tuesday.  Denies any recent chest discomfort.  She does report some anxiety and stressful things at home.  She is on amlodopine, hctz and zoloft.  She denies any SI.  She does report a little bit of dysuria  She denies any symptoms at this time   Physical Exam   Triage Vital Signs: ED Triage Vitals  Enc Vitals Group     BP 02/06/22 1415 132/90     Pulse Rate 02/06/22 1415 93     Resp 02/06/22 1415 18     Temp 02/06/22 1415 98.5 F (36.9 C)     Temp Source 02/06/22 1415 Oral     SpO2 02/06/22 1415 95 %     Weight 02/06/22 1413 151 lb 3.8 oz (68.6 kg)     Height 02/06/22 1413 5\' 4"  (1.626 m)     Head Circumference --      Peak Flow --      Pain Score 02/06/22 1412 0     Pain Loc --      Pain Edu? --      Excl. in GC? --     Most recent vital signs: Vitals:   02/06/22 1415  BP: 132/90  Pulse: 93  Resp: 18  Temp: 98.5 F (36.9 C)  SpO2: 95%     General: Awake, no distress.  CV:  Good peripheral perfusion.  Resp:  Normal effort.  Clear lung Abd:  No distention.  Other:  Clear nerves are intact.  Equal strength in arms and legs.  Sensation intact.  Finger-nose  intact bilaterally.  Normal gait  ED Results / Procedures / Treatments   Labs (all labs ordered are listed, but only abnormal results are displayed) Labs Reviewed  BASIC METABOLIC PANEL - Abnormal; Notable for the following components:      Result Value   Glucose, Bld 148 (*)    All other components within normal limits  CBC - Abnormal; Notable for the following components:   WBC 10.8 (*)    All other components within normal limits  TROPONIN I (HIGH SENSITIVITY)  TROPONIN I (HIGH SENSITIVITY)     EKG  My interpretation of EKG: Normal sinus rate of 87 without any ST elevation or T wave inversions, left posterior fascicular block   RADIOLOGY I have reviewed the xray personally and there is no pneumonia    MEDICATIONS ORDERED IN ED: Medications - No data to display   IMPRESSION / MDM / ASSESSMENT AND PLAN / ED COURSE  I reviewed the triage vital signs and the nursing notes.  Patient with diabetes who comes in with  episodes of dizziness. Differential diagnosis includes, but is not limited to, arrhythmia, ACS, Electra abnormalities, anemia, UTI.  I suspect that patient is getting these episodes and then when she checks her blood pressure it is slightly elevated but at this time she is denying any symptoms and her neuro exam is intact and her blood pressure is reassuring.  I have a low suspicion for stroke, hemorrhage.  We discussed her anxiety and stress at home and she does report a ton of it recently.  Her doctor recently told her to go up on her Zoloft and she is in the chart try not tomorrow.  She denies any SI.   BMP is normal Cbc slightly elevated white count Trop negative symptoms of been going on for greater than 3 hours  We will get UA and discharge patient afterwards   The patient is on the cardiac monitor to evaluate for evidence of arrhythmia and/or significant heart rate changes.   FINAL CLINICAL IMPRESSION(S) / ED DIAGNOSES   Final diagnoses:   Dizziness  Anxiety     Rx / DC Orders   ED Discharge Orders     None        Note:  This document was prepared using Dragon voice recognition software and may include unintentional dictation errors.   Concha Se, MD 02/06/22 [phone]

## 2022-02-08 LAB — URINE CULTURE: Culture: NO GROWTH

## 2022-03-14 ENCOUNTER — Other Ambulatory Visit (INDEPENDENT_AMBULATORY_CARE_PROVIDER_SITE_OTHER): Payer: Managed Care, Other (non HMO)

## 2022-03-14 ENCOUNTER — Other Ambulatory Visit: Payer: Self-pay

## 2022-03-14 DIAGNOSIS — E7800 Pure hypercholesterolemia, unspecified: Secondary | ICD-10-CM

## 2022-03-14 DIAGNOSIS — I10 Essential (primary) hypertension: Secondary | ICD-10-CM

## 2022-03-14 DIAGNOSIS — R739 Hyperglycemia, unspecified: Secondary | ICD-10-CM

## 2022-03-14 LAB — HEPATIC FUNCTION PANEL
ALT: 23 U/L (ref 0–35)
AST: 18 U/L (ref 0–37)
Albumin: 4.5 g/dL (ref 3.5–5.2)
Alkaline Phosphatase: 53 U/L (ref 39–117)
Bilirubin, Direct: 0 mg/dL (ref 0.0–0.3)
Total Bilirubin: 0.4 mg/dL (ref 0.2–1.2)
Total Protein: 6.4 g/dL (ref 6.0–8.3)

## 2022-03-14 LAB — LIPID PANEL
Cholesterol: 271 mg/dL — ABNORMAL HIGH (ref 0–200)
HDL: 55.9 mg/dL (ref >39.00)
LDL Cholesterol: 190 mg/dL — ABNORMAL HIGH (ref 0–99)
NonHDL: 214.75
Total CHOL/HDL Ratio: 5
Triglycerides: 122 mg/dL (ref 0.0–149.0)
VLDL: 24.4 mg/dL (ref 0.0–40.0)

## 2022-03-14 LAB — BASIC METABOLIC PANEL
BUN: 24 mg/dL — ABNORMAL HIGH (ref 6–23)
CO2: 25 mEq/L (ref 19–32)
Calcium: 9 mg/dL (ref 8.4–10.5)
Chloride: 104 mEq/L (ref 96–112)
Creatinine, Ser: 0.72 mg/dL (ref 0.40–1.20)
GFR: 89.67 mL/min (ref >60.00)
Glucose, Bld: 146 mg/dL — ABNORMAL HIGH (ref 70–99)
Potassium: 3.8 mEq/L (ref 3.5–5.1)
Sodium: 138 mEq/L (ref 135–145)

## 2022-03-14 LAB — HEMOGLOBIN A1C: Hgb A1c MFr Bld: 6.6 % — ABNORMAL HIGH (ref 4.6–6.5)

## 2022-03-17 ENCOUNTER — Ambulatory Visit (INDEPENDENT_AMBULATORY_CARE_PROVIDER_SITE_OTHER): Payer: Managed Care, Other (non HMO) | Admitting: Internal Medicine

## 2022-03-17 ENCOUNTER — Other Ambulatory Visit: Payer: Self-pay

## 2022-03-17 ENCOUNTER — Encounter: Payer: Self-pay | Admitting: Internal Medicine

## 2022-03-17 VITALS — BP 124/78 | HR 86 | Temp 98.5°F | Resp 14 | Ht 64.0 in | Wt 151.6 lb

## 2022-03-17 DIAGNOSIS — M889 Osteitis deformans of unspecified bone: Secondary | ICD-10-CM | POA: Diagnosis not present

## 2022-03-17 DIAGNOSIS — F439 Reaction to severe stress, unspecified: Secondary | ICD-10-CM

## 2022-03-17 DIAGNOSIS — Z0000 Encounter for general adult medical examination without abnormal findings: Secondary | ICD-10-CM

## 2022-03-17 DIAGNOSIS — E7800 Pure hypercholesterolemia, unspecified: Secondary | ICD-10-CM

## 2022-03-17 DIAGNOSIS — Z1231 Encounter for screening mammogram for malignant neoplasm of breast: Secondary | ICD-10-CM | POA: Diagnosis not present

## 2022-03-17 DIAGNOSIS — I10 Essential (primary) hypertension: Secondary | ICD-10-CM | POA: Diagnosis not present

## 2022-03-17 DIAGNOSIS — E1165 Type 2 diabetes mellitus with hyperglycemia: Secondary | ICD-10-CM | POA: Diagnosis not present

## 2022-03-17 MED ORDER — SERTRALINE HCL 100 MG PO TABS: 100.0000 mg | ORAL_TABLET | Freq: Every day | ORAL | 0 refills | Status: DC

## 2022-03-17 NOTE — Assessment & Plan Note (Addendum)
Physical today 03/17/22.  PAP 09/2020 - negative with negative HPV.  Mammogram overdue.  Colonoscopy 02/13/21 - normal.  Recommended f/u in 10 years.   ?

## 2022-03-17 NOTE — Patient Instructions (Signed)
Increase zoloft to 100mg  per day.  (Can take 50mg  - two per day)  ?

## 2022-03-17 NOTE — Progress Notes (Signed)
Patient ID: Rachel Collins, female   DOB: 02-27-1961, 61 y.o.   MRN: [id number] ? ? ?Subjective:  ? ? Patient ID: Rachel Collins, female    DOB: [birth year], 61 y.o.   MRN: [id number] ? ?This visit occurred during the SARS-CoV-2 public health emergency.  Safety protocols were in place, including screening questions prior to the visit, additional usage of staff PPE, and extensive cleaning of exam room while observing appropriate contact time as indicated for disinfecting solutions.  ? ?Patient here for her physical exam.  ? ?Chief Complaint  ?Patient presents with  ? Annual Exam  ?  Discuss labs, no other concerns. Scheduled for Mammogram in April   ? .  ? ?HPI ?Was evaluated in ER 02/06/22 - dizziness and elevated blood pressure.  W/up unrevealing.  States when occurred - increased blood pressure, dizziness, blurring of vision and increased stress.  Has noticed increased thirst.  Decreased energy.  Feels nervous. Reviewed labs.  A1c increased 6.6.  cholesterol elevated.  No chest pain or sob reported.  No abdominal pain or bowel change reported.  Overdue mammogram.  Increased stress.  Discussed.   ? ? ?Past Medical History:  ?Diagnosis Date  ? Diabetes mellitus without complication (HCC)   ? Esophagitis   ? Gastritis   ? Gastroparesis   ? History of migraine headaches   ? Hypercholesterolemia   ? Hyperglycemia   ? Hypertension   ? Paget's bone disease   ? ?Past Surgical History:  ?Procedure Laterality Date  ? BREAST BIOPSY Left 2010  ? benign  ? CERVICAL DISC SURGERY    ? C5-C7 fusion  ? COLONOSCOPY WITH PROPOFOL N/A 02/13/2021  ? Procedure: COLONOSCOPY WITH PROPOFOL;  Surgeon: Earline Mayotte, MD;  Location: [facility] ENDOSCOPY;  Service: Endoscopy;  Laterality: N/A;  ? ESOPHAGOGASTRODUODENOSCOPY (EGD) WITH PROPOFOL N/A 02/13/2021  ? Procedure: ESOPHAGOGASTRODUODENOSCOPY (EGD) WITH PROPOFOL;  Surgeon: Earline Mayotte, MD;  Location: [facility] ENDOSCOPY;  Service: Endoscopy;  Laterality: N/A;  ? TUBAL LIGATION    ? ?Family  History  ?Problem Relation Age of Onset  ? Heart disease Father   ?     myocardial infarction x 5  ? Prostate cancer Father   ? Uterine cancer Mother   ? COPD Mother   ? Diabetes Mother   ? Heart disease Mother   ?     myocardial infarction  ? Breast cancer Mother 73  ? Heart disease Paternal Grandmother   ? Heart disease Maternal Grandfather   ? ?Social History  ? ?Socioeconomic History  ? Marital status: Married  ?  Spouse name: Not on file  ? Number of children: 1  ? Years of education: Not on file  ? Highest education level: Not on file  ?Occupational History  ? Not on file  ?Tobacco Use  ? Smoking status: Never  ? Smokeless tobacco: Never  ?Vaping Use  ? Vaping Use: Never used  ?Substance and Sexual Activity  ? Alcohol use: Yes  ?  Alcohol/week: 7.0 standard drinks  ?  Types: 7 Glasses of wine per week  ?  Comment: 1 glass with dinner  ? Drug use: No  ? Sexual activity: Not on file  ?Other Topics Concern  ? Not on file  ?Social History Narrative  ? Not on file  ? ?Social Determinants of Health  ? ?Financial Resource Strain: Not on file  ?Food Insecurity: Not on file  ?Transportation Needs: Not on file  ?Physical Activity: Not on file  ?Stress:  Not on file  ?Social Connections: Not on file  ? ? ? ?Review of Systems  ?Constitutional:  Negative for fatigue and unexpected weight change.  ?HENT:  Negative for congestion, sinus pressure and sore throat.   ?Eyes:  Negative for pain and redness.  ?Respiratory:  Negative for cough, chest tightness and shortness of breath.   ?Cardiovascular:  Negative for chest pain, palpitations and leg swelling.  ?Gastrointestinal:  Negative for abdominal pain, diarrhea, nausea and vomiting.  ?Genitourinary:  Negative for difficulty urinating and dysuria.  ?Musculoskeletal:  Negative for joint swelling and myalgias.  ?Skin:  Negative for color change and rash.  ?Neurological:  Negative for dizziness, light-headedness and headaches.  ?Hematological:  Negative for adenopathy. Does not  bruise/bleed easily.  ?Psychiatric/Behavioral:  Negative for agitation and dysphoric mood.   ?     Increased stress as outlined.   ? ?   ?Objective:  ?  ? ?BP 124/78 (BP Location: Left Arm, Patient Position: Sitting, Cuff Size: Normal)   Pulse 86   Temp 98.5 ?F (36.9 ?C) (Oral)   Resp 14   Ht 5\' 4"  (1.626 m)   Wt 151 lb 9.6 oz (68.8 kg)   LMP  (LMP Unknown)   SpO2 96%   BMI 26.02 kg/m?  ?Wt Readings from Last 3 Encounters:  ?03/17/22 151 lb 9.6 oz (68.8 kg)  ?02/06/22 151 lb 3.8 oz (68.6 kg)  ?06/14/21 151 lb 3.2 oz (68.6 kg)  ? ? ?Physical Exam ?Vitals reviewed.  ?Constitutional:   ?   General: She is not in acute distress. ?   Appearance: Normal appearance. She is well-developed.  ?HENT:  ?   Head: Normocephalic and atraumatic.  ?   Right Ear: External ear normal.  ?   Left Ear: External ear normal.  ?Eyes:  ?   General: No scleral icterus.    ?   Right eye: No discharge.     ?   Left eye: No discharge.  ?   Conjunctiva/sclera: Conjunctivae normal.  ?Neck:  ?   Thyroid: No thyromegaly.  ?Cardiovascular:  ?   Rate and Rhythm: Normal rate and regular rhythm.  ?Pulmonary:  ?   Effort: No tachypnea, accessory muscle usage or respiratory distress.  ?   Breath sounds: Normal breath sounds. No decreased breath sounds or wheezing.  ?Chest:  ?Breasts: ?   Right: No inverted nipple, mass, nipple discharge or tenderness (no axillary adenopathy).  ?   Left: No inverted nipple, mass, nipple discharge or tenderness (no axilarry adenopathy).  ?Abdominal:  ?   General: Bowel sounds are normal.  ?   Palpations: Abdomen is soft.  ?   Tenderness: There is no abdominal tenderness.  ?Musculoskeletal:     ?   General: No swelling or tenderness.  ?   Cervical back: Neck supple.  ?Lymphadenopathy:  ?   Cervical: No cervical adenopathy.  ?Skin: ?   Findings: No erythema or rash.  ?Neurological:  ?   Mental Status: She is alert and oriented to person, place, and time.  ?Psychiatric:     ?   Mood and Affect: Mood normal.     ?    Behavior: Behavior normal.  ? ? ? ?Outpatient Encounter Medications as of 03/17/2022  ?Medication Sig  ? amLODipine (NORVASC) 5 MG tablet TAKE 1 TABLET BY MOUTH EVERY DAY  ? meloxicam (MOBIC) 15 MG tablet SMARTSIG:1 Tablet(s) By Mouth Daily  ? sertraline (ZOLOFT) 100 MG tablet Take 1 tablet (100 mg total) by  mouth daily.  ? traZODone (DESYREL) 50 MG tablet TAKE 1 TO 2 TABLETS BY MOUTH AT BEDTIME  ? [DISCONTINUED] hydrochlorothiazide (HYDRODIURIL) 25 MG tablet TAKE 1 TABLET BY MOUTH EVERY DAY  ? [DISCONTINUED] losartan (COZAAR) 100 MG tablet TAKE 1 TABLET BY MOUTH EVERY DAY  ? [DISCONTINUED] lovastatin (MEVACOR) 40 MG tablet TAKE 1 TABLET BY MOUTH EVERYDAY AT BEDTIME  ? [DISCONTINUED] sertraline (ZOLOFT) 50 MG tablet TAKE 1 AND 1/2 TABLETS DAILY BY MOUTH  ? ?No facility-administered encounter medications on file as of 03/17/2022.  ?  ? ?Lab Results  ?Component Value Date  ? WBC 10.8 (H) 02/06/2022  ? HGB 14.7 02/06/2022  ? HCT 45.8 02/06/2022  ? PLT 319 02/06/2022  ? GLUCOSE 146 (H) 03/14/2022  ? CHOL 271 (H) 03/14/2022  ? TRIG 122.0 03/14/2022  ? HDL 55.90 03/14/2022  ? LDLDIRECT 150.0 09/24/2020  ? LDLCALC 190 (H) 03/14/2022  ? ALT 23 03/14/2022  ? AST 18 03/14/2022  ? NA 138 03/14/2022  ? K 3.8 03/14/2022  ? CL 104 03/14/2022  ? CREATININE 0.72 03/14/2022  ? BUN 24 (H) 03/14/2022  ? CO2 25 03/14/2022  ? TSH 1.67 01/28/2021  ? HGBA1C 6.6 (H) 03/14/2022  ? MICROALBUR 1.1 03/09/2015  ? ? ?DG Chest 2 View ? ?Result Date: 02/06/2022 ?CLINICAL DATA:  Chest pain, dizziness, blurred vision, elevated blood pressure, history hypertension EXAM: CHEST - 2 VIEW COMPARISON:  12/06/2010 FINDINGS: Normal heart size, mediastinal contours, and pulmonary vascularity. Lungs clear. No pulmonary infiltrate, pleural effusion, or pneumothorax. Prior cervical spine fusion. No acute osseous findings. IMPRESSION: No acute abnormalities. Electronically Signed   By: Ulyses SouthwardMark  Boles M.D.   On: 02/06/2022 14:34  ? ? ?   ?Assessment & Plan:  ? ?Problem  List Items Addressed This Visit   ? ? Health care maintenance  ?  Physical today 03/17/22.  PAP 09/2020 - negative with negative HPV.  Mammogram overdue.  Colonoscopy 02/13/21 - normal.  Recommended f/u in 10 years.

## 2022-03-18 ENCOUNTER — Other Ambulatory Visit: Payer: Self-pay | Admitting: Internal Medicine

## 2022-03-20 ENCOUNTER — Other Ambulatory Visit: Payer: Self-pay | Admitting: Internal Medicine

## 2022-03-20 NOTE — Telephone Encounter (Signed)
Pt called in requesting refill on medication (losartan (COZAAR) 100 MG tablet)... Pt stated that pharmacy have sent over request but haven't heard anything from our office.. Pt requesting callback...  ?

## 2022-03-21 ENCOUNTER — Encounter: Payer: Self-pay | Admitting: Internal Medicine

## 2022-03-21 ENCOUNTER — Other Ambulatory Visit: Payer: Self-pay

## 2022-03-21 MED ORDER — LOVASTATIN 40 MG PO TABS: 40.0000 mg | ORAL_TABLET | Freq: Every day | ORAL | 0 refills | Status: DC

## 2022-03-21 MED ORDER — HYDROCHLOROTHIAZIDE 25 MG PO TABS: 25.0000 mg | ORAL_TABLET | Freq: Every day | ORAL | 1 refills | Status: DC

## 2022-03-23 ENCOUNTER — Encounter: Payer: Self-pay | Admitting: Internal Medicine

## 2022-03-23 NOTE — Assessment & Plan Note (Addendum)
Low carb diet and exercise.  Follow met b and a1c. Discussed labs with her today. Discussed diabetes and diet and exercise.  ?

## 2022-03-23 NOTE — Assessment & Plan Note (Signed)
Blood pressure has been under good control.  Continue losartan, hctz and amlodipine.  Follow pressures.  Follow metabolic panel.  

## 2022-03-23 NOTE — Assessment & Plan Note (Signed)
On mevacor.  Low cholesterol diet and exercise.  Follow lipid panel and liver function tests.  

## 2022-03-23 NOTE — Assessment & Plan Note (Signed)
Has been evaluated by endocrinology.  Recommended caclium and vitamin D.  

## 2022-03-23 NOTE — Assessment & Plan Note (Addendum)
Increased stress.  On zoloft.  Discussed increase dose - 100mg  zoloft.  Follow.  She feels this is contributing to her previous symptoms.  Discussed further w/up and evaluation.  Wants to monitor.  ?

## 2022-05-05 ENCOUNTER — Ambulatory Visit: Payer: Managed Care, Other (non HMO) | Admitting: Internal Medicine

## 2022-05-08 ENCOUNTER — Other Ambulatory Visit: Payer: Self-pay | Admitting: Internal Medicine

## 2022-05-20 ENCOUNTER — Encounter: Payer: Self-pay | Admitting: Internal Medicine

## 2022-05-20 ENCOUNTER — Ambulatory Visit: Payer: BC Managed Care – PPO | Admitting: Internal Medicine

## 2022-05-20 DIAGNOSIS — M25542 Pain in joints of left hand: Secondary | ICD-10-CM

## 2022-05-20 DIAGNOSIS — E1165 Type 2 diabetes mellitus with hyperglycemia: Secondary | ICD-10-CM | POA: Diagnosis not present

## 2022-05-20 DIAGNOSIS — M889 Osteitis deformans of unspecified bone: Secondary | ICD-10-CM

## 2022-05-20 DIAGNOSIS — F439 Reaction to severe stress, unspecified: Secondary | ICD-10-CM

## 2022-05-20 DIAGNOSIS — R87810 Cervical high risk human papillomavirus (HPV) DNA test positive: Secondary | ICD-10-CM | POA: Diagnosis not present

## 2022-05-20 DIAGNOSIS — E7800 Pure hypercholesterolemia, unspecified: Secondary | ICD-10-CM

## 2022-05-20 DIAGNOSIS — I10 Essential (primary) hypertension: Secondary | ICD-10-CM | POA: Diagnosis not present

## 2022-05-20 DIAGNOSIS — M25541 Pain in joints of right hand: Secondary | ICD-10-CM

## 2022-05-20 MED ORDER — SERTRALINE HCL 50 MG PO TABS: 50.0000 mg | ORAL_TABLET | Freq: Every day | ORAL | 0 refills | Status: DC

## 2022-05-20 NOTE — Progress Notes (Signed)
Patient ID: Rachel Collins, female   DOB: December 08, 1961, 61 y.o.   MRN: [id number]   Subjective:    Patient ID: Rachel Collins, female    DOB: [birth year], 61 y.o.   MRN: [id number]   Patient here for a scheduled follow up.   Chief Complaint  Patient presents with   Follow-up    7wk follow up   Stress   .   HPI Here to follow up regarding increased stress.  Also f/u regarding her blood pressure and cholesterol.  Last visit, discussed increased stress and increased zoloft to $RemoveB'100mg'VylmSpTU$  q day.  She did not like the way this made her feel.  Only taking $RemoveBef'50mg'HjCtnmPFcU$  q day now.  Overall handling stress.  Does not feel needs any further intervention.  She is having problems with increased hand pain and stiffness.  Trigger finger.  Taking meloxicam.  Still with increased pain.  Discussed f/u with rheumatology.  No chest pain or sob reported.  No abdominal pain.  Bowels moving.   Past Medical History:  Diagnosis Date   Diabetes mellitus without complication (HCC)    Esophagitis    Gastritis    Gastroparesis    History of migraine headaches    Hypercholesterolemia    Hyperglycemia    Hypertension    Paget's bone disease    Past Surgical History:  Procedure Laterality Date   BREAST BIOPSY Left 2010   benign   CERVICAL DISC SURGERY     C5-C7 fusion   COLONOSCOPY WITH PROPOFOL N/A 02/13/2021   Procedure: COLONOSCOPY WITH PROPOFOL;  Surgeon: Robert Bellow, MD;  Location: [place];  Service: Endoscopy;  Laterality: N/A;   ESOPHAGOGASTRODUODENOSCOPY (EGD) WITH PROPOFOL N/A 02/13/2021   Procedure: ESOPHAGOGASTRODUODENOSCOPY (EGD) WITH PROPOFOL;  Surgeon: Robert Bellow, MD;  Location: [facility];  Service: Endoscopy;  Laterality: N/A;   TUBAL LIGATION     Family History  Problem Relation Age of Onset   Heart disease Father        myocardial infarction x 5   Prostate cancer Father    Uterine cancer Mother    COPD Mother    Diabetes Mother    Heart disease Mother        myocardial  infarction   Breast cancer Mother 54   Heart disease Paternal Grandmother    Heart disease Maternal Grandfather    Social History   Socioeconomic History   Marital status: Married    Spouse name: Not on file   Number of children: 1   Years of education: Not on file   Highest education level: Not on file  Occupational History   Not on file  Tobacco Use   Smoking status: Never   Smokeless tobacco: Never  Vaping Use   Vaping Use: Never used  Substance and Sexual Activity   Alcohol use: Yes    Alcohol/week: 7.0 standard drinks    Types: 7 Glasses of wine per week    Comment: 1 glass with dinner   Drug use: No   Sexual activity: Not on file  Other Topics Concern   Not on file  Social History Narrative   Not on file   Social Determinants of Health   Financial Resource Strain: Not on file  Food Insecurity: Not on file  Transportation Needs: Not on file  Physical Activity: Not on file  Stress: Not on file  Social Connections: Not on file     Review of Systems  Constitutional:  Negative for appetite change  and unexpected weight change.  HENT:  Negative for congestion and sinus pressure.   Respiratory:  Negative for cough, chest tightness and shortness of breath.   Cardiovascular:  Negative for chest pain, palpitations and leg swelling.  Gastrointestinal:  Negative for abdominal pain, diarrhea, nausea and vomiting.  Genitourinary:  Negative for difficulty urinating and dysuria.  Musculoskeletal:  Negative for myalgias.       Increased joint stiffness and pain - hands.  Trigger finger.   Skin:  Negative for color change and rash.  Neurological:  Negative for dizziness, light-headedness and headaches.  Psychiatric/Behavioral:  Negative for agitation and dysphoric mood.       Objective:     BP 132/90 (BP Location: Left Arm, Patient Position: Sitting, Cuff Size: Small)   Pulse 72   Temp 98.3 F (36.8 C) (Temporal)   Resp 14   Ht $R'5\' 4"'Le$  (1.626 m)   Wt 149 lb 12.8 oz  (67.9 kg)   LMP  (LMP Unknown)   SpO2 98%   BMI 25.71 kg/m  Wt Readings from Last 3 Encounters:  05/20/22 149 lb 12.8 oz (67.9 kg)  03/17/22 151 lb 9.6 oz (68.8 kg)  02/06/22 151 lb 3.8 oz (68.6 kg)    Physical Exam Vitals reviewed.  Constitutional:      General: She is not in acute distress.    Appearance: Normal appearance.  HENT:     Head: Normocephalic and atraumatic.     Right Ear: External ear normal.     Left Ear: External ear normal.  Eyes:     General: No scleral icterus.       Right eye: No discharge.        Left eye: No discharge.     Conjunctiva/sclera: Conjunctivae normal.  Neck:     Thyroid: No thyromegaly.  Cardiovascular:     Rate and Rhythm: Normal rate and regular rhythm.  Pulmonary:     Effort: No respiratory distress.     Breath sounds: Normal breath sounds. No wheezing.  Abdominal:     General: Bowel sounds are normal.     Palpations: Abdomen is soft.     Tenderness: There is no abdominal tenderness.  Musculoskeletal:        General: No tenderness.     Cervical back: Neck supple. No tenderness.     Comments: Increased pain with attempts at making a fist.  No increased erythema.    Lymphadenopathy:     Cervical: No cervical adenopathy.  Skin:    Findings: No erythema or rash.  Neurological:     Mental Status: She is alert.  Psychiatric:        Mood and Affect: Mood normal.        Behavior: Behavior normal.     Outpatient Encounter Medications as of 05/20/2022  Medication Sig   sertraline (ZOLOFT) 50 MG tablet Take 1 tablet (50 mg total) by mouth daily.   amLODipine (NORVASC) 5 MG tablet TAKE 1 TABLET BY MOUTH EVERY DAY   hydrochlorothiazide (HYDRODIURIL) 25 MG tablet Take 1 tablet (25 mg total) by mouth daily.   losartan (COZAAR) 100 MG tablet TAKE 1 TABLET BY MOUTH EVERY DAY   lovastatin (MEVACOR) 40 MG tablet Take 1 tablet (40 mg total) by mouth at bedtime.   meloxicam (MOBIC) 15 MG tablet SMARTSIG:1 Tablet(s) By Mouth Daily   traZODone  (DESYREL) 50 MG tablet TAKE 1 TO 2 TABLETS BY MOUTH AT BEDTIME   [DISCONTINUED] sertraline (ZOLOFT) 100 MG tablet Take  1 tablet (100 mg total) by mouth daily.   No facility-administered encounter medications on file as of 05/20/2022.     Lab Results  Component Value Date   WBC 10.8 (H) 02/06/2022   HGB 14.7 02/06/2022   HCT 45.8 02/06/2022   PLT 319 02/06/2022   GLUCOSE 146 (H) 03/14/2022   CHOL 271 (H) 03/14/2022   TRIG 122.0 03/14/2022   HDL 55.90 03/14/2022   LDLDIRECT 150.0 09/24/2020   LDLCALC 190 (H) 03/14/2022   ALT 23 03/14/2022   AST 18 03/14/2022   NA 138 03/14/2022   K 3.8 03/14/2022   CL 104 03/14/2022   CREATININE 0.72 03/14/2022   BUN 24 (H) 03/14/2022   CO2 25 03/14/2022   TSH 1.67 01/28/2021   HGBA1C 6.6 (H) 03/14/2022   MICROALBUR 1.1 03/09/2015    DG Chest 2 View  Result Date: 02/06/2022 CLINICAL DATA:  Chest pain, dizziness, blurred vision, elevated blood pressure, history hypertension EXAM: CHEST - 2 VIEW COMPARISON:  12/06/2010 FINDINGS: Normal heart size, mediastinal contours, and pulmonary vascularity. Lungs clear. No pulmonary infiltrate, pleural effusion, or pneumothorax. Prior cervical spine fusion. No acute osseous findings. IMPRESSION: No acute abnormalities. Electronically Signed   By: Lavonia Dana M.D.   On: 02/06/2022 14:34       Assessment & Plan:   Problem List Items Addressed This Visit     Cervical high risk HPV (human papillomavirus) test positive    PAP 09/26/20 - negative with negative HPV.        Hypercholesterolemia    On mevacor.  Low cholesterol diet and exercise.  Follow lipid panel and liver function tests.         Relevant Orders   CBC with Differential/Platelet   Hepatic function panel   TSH   Lipid panel   Hypertension    Blood pressure has been under good control.  Continue losartan, hctz and amlodipine.  Follow pressures.  Follow metabolic panel.        Joint pain    Hand pain and stiffness as outlined.   Trigger finger.  Is s/p release.  On meloxicam.  Increased issues.  Discussed.  Refer back to rheumatology.         Relevant Orders   Ambulatory referral to Rheumatology   Paget disease of bone    Has been evaluated by endocrinology.  Recommended caclium and vitamin D.        Stress    Increased stress.  On zoloft.  Did not tolerate higher dose of zoloft.  Doing well on current regimen.  Follow.        Type 2 diabetes mellitus with hyperglycemia (HCC)    Low carb diet and exercise.  Follow met b and a1c.        Relevant Orders   Basic metabolic panel   Hemoglobin A1c     Einar Pheasant, MD

## 2022-05-25 ENCOUNTER — Encounter: Payer: Self-pay | Admitting: Internal Medicine

## 2022-05-25 NOTE — Assessment & Plan Note (Signed)
Has been evaluated by endocrinology.  Recommended caclium and vitamin D.  

## 2022-05-25 NOTE — Assessment & Plan Note (Signed)
Increased stress.  On zoloft.  Did not tolerate higher dose of zoloft.  Doing well on current regimen.  Follow.

## 2022-05-25 NOTE — Assessment & Plan Note (Signed)
Blood pressure has been under good control.  Continue losartan, hctz and amlodipine.  Follow pressures.  Follow metabolic panel.  

## 2022-05-25 NOTE — Assessment & Plan Note (Addendum)
Low carb diet and exercise.  Follow met b and a1c.  

## 2022-05-25 NOTE — Assessment & Plan Note (Signed)
PAP 09/26/20 - negative with negative HPV.  

## 2022-05-25 NOTE — Assessment & Plan Note (Signed)
On mevacor.  Low cholesterol diet and exercise.  Follow lipid panel and liver function tests.  

## 2022-05-25 NOTE — Assessment & Plan Note (Signed)
Hand pain and stiffness as outlined.  Trigger finger.  Is s/p release.  On meloxicam.  Increased issues.  Discussed.  Refer back to rheumatology.

## 2022-06-30 ENCOUNTER — Other Ambulatory Visit: Payer: BC Managed Care – PPO

## 2022-07-06 ENCOUNTER — Other Ambulatory Visit: Payer: Self-pay | Admitting: Internal Medicine

## 2022-07-07 ENCOUNTER — Other Ambulatory Visit: Payer: BC Managed Care – PPO

## 2022-07-10 ENCOUNTER — Other Ambulatory Visit: Payer: Self-pay | Admitting: Internal Medicine

## 2022-07-11 ENCOUNTER — Ambulatory Visit: Payer: BC Managed Care – PPO | Admitting: Internal Medicine

## 2022-08-01 ENCOUNTER — Other Ambulatory Visit: Payer: BC Managed Care – PPO

## 2022-08-05 ENCOUNTER — Ambulatory Visit: Payer: BC Managed Care – PPO | Admitting: Internal Medicine

## 2022-08-09 ENCOUNTER — Other Ambulatory Visit: Payer: Self-pay | Admitting: Internal Medicine

## 2022-09-11 ENCOUNTER — Other Ambulatory Visit: Payer: Self-pay | Admitting: Internal Medicine

## 2022-09-19 ENCOUNTER — Other Ambulatory Visit (INDEPENDENT_AMBULATORY_CARE_PROVIDER_SITE_OTHER): Payer: BC Managed Care – PPO

## 2022-09-19 DIAGNOSIS — E7800 Pure hypercholesterolemia, unspecified: Secondary | ICD-10-CM

## 2022-09-19 DIAGNOSIS — E1165 Type 2 diabetes mellitus with hyperglycemia: Secondary | ICD-10-CM

## 2022-09-19 LAB — CBC WITH DIFFERENTIAL/PLATELET
Basophils Absolute: 0.1 10*3/uL (ref 0.0–0.1)
Basophils Relative: 1.1 % (ref 0.0–3.0)
Eosinophils Absolute: 0.2 10*3/uL (ref 0.0–0.7)
Eosinophils Relative: 4.1 % (ref 0.0–5.0)
HCT: 42.9 % (ref 36.0–46.0)
Hemoglobin: 14.2 g/dL (ref 12.0–15.0)
Lymphocytes Relative: 31.3 % (ref 12.0–46.0)
Lymphs Abs: 1.8 10*3/uL (ref 0.7–4.0)
MCHC: 33.2 g/dL (ref 30.0–36.0)
MCV: 90 fl (ref 78.0–100.0)
Monocytes Absolute: 0.5 10*3/uL (ref 0.1–1.0)
Monocytes Relative: 8.3 % (ref 3.0–12.0)
Neutro Abs: 3.1 10*3/uL (ref 1.4–7.7)
Neutrophils Relative %: 55.2 % (ref 43.0–77.0)
Platelets: 268 10*3/uL (ref 150.0–400.0)
RBC: 4.77 Mil/uL (ref 3.87–5.11)
RDW: 13.5 % (ref 11.5–15.5)
WBC: 5.7 10*3/uL (ref 4.0–10.5)

## 2022-09-19 LAB — HEPATIC FUNCTION PANEL
ALT: 32 U/L (ref 0–35)
AST: 21 U/L (ref 0–37)
Albumin: 4.8 g/dL (ref 3.5–5.2)
Alkaline Phosphatase: 53 U/L (ref 39–117)
Bilirubin, Direct: 0.1 mg/dL (ref 0.0–0.3)
Total Bilirubin: 0.4 mg/dL (ref 0.2–1.2)
Total Protein: 7.2 g/dL (ref 6.0–8.3)

## 2022-09-19 LAB — LIPID PANEL
Cholesterol: 220 mg/dL — ABNORMAL HIGH (ref 0–200)
HDL: 60.3 mg/dL (ref >39.00)
NonHDL: 160.07
Total CHOL/HDL Ratio: 4
Triglycerides: 211 mg/dL — ABNORMAL HIGH (ref 0.0–149.0)
VLDL: 42.2 mg/dL — ABNORMAL HIGH (ref 0.0–40.0)

## 2022-09-19 LAB — BASIC METABOLIC PANEL
BUN: 24 mg/dL — ABNORMAL HIGH (ref 6–23)
CO2: 25 mEq/L (ref 19–32)
Calcium: 9.8 mg/dL (ref 8.4–10.5)
Chloride: 100 mEq/L (ref 96–112)
Creatinine, Ser: 0.83 mg/dL (ref 0.40–1.20)
GFR: 75.33 mL/min (ref >60.00)
Glucose, Bld: 122 mg/dL — ABNORMAL HIGH (ref 70–99)
Potassium: 4.2 mEq/L (ref 3.5–5.1)
Sodium: 137 mEq/L (ref 135–145)

## 2022-09-19 LAB — LDL CHOLESTEROL, DIRECT: Direct LDL: 138 mg/dL

## 2022-09-19 LAB — HEMOGLOBIN A1C: Hgb A1c MFr Bld: 6.7 % — ABNORMAL HIGH (ref 4.6–6.5)

## 2022-09-19 LAB — TSH: TSH: 2.4 u[IU]/mL (ref 0.35–5.50)

## 2022-09-23 ENCOUNTER — Encounter: Payer: Self-pay | Admitting: Internal Medicine

## 2022-09-23 ENCOUNTER — Ambulatory Visit: Payer: BC Managed Care – PPO | Admitting: Internal Medicine

## 2022-09-23 VITALS — BP 110/60 | HR 87 | Temp 99.2°F | Ht 64.0 in | Wt 150.0 lb

## 2022-09-23 DIAGNOSIS — E559 Vitamin D deficiency, unspecified: Secondary | ICD-10-CM

## 2022-09-23 DIAGNOSIS — Z23 Encounter for immunization: Secondary | ICD-10-CM

## 2022-09-23 DIAGNOSIS — I10 Essential (primary) hypertension: Secondary | ICD-10-CM

## 2022-09-23 DIAGNOSIS — Z1211 Encounter for screening for malignant neoplasm of colon: Secondary | ICD-10-CM

## 2022-09-23 DIAGNOSIS — M889 Osteitis deformans of unspecified bone: Secondary | ICD-10-CM

## 2022-09-23 DIAGNOSIS — E1165 Type 2 diabetes mellitus with hyperglycemia: Secondary | ICD-10-CM | POA: Diagnosis not present

## 2022-09-23 DIAGNOSIS — M25512 Pain in left shoulder: Secondary | ICD-10-CM

## 2022-09-23 DIAGNOSIS — F439 Reaction to severe stress, unspecified: Secondary | ICD-10-CM

## 2022-09-23 DIAGNOSIS — E119 Type 2 diabetes mellitus without complications: Secondary | ICD-10-CM | POA: Diagnosis not present

## 2022-09-23 DIAGNOSIS — E7800 Pure hypercholesterolemia, unspecified: Secondary | ICD-10-CM

## 2022-09-23 DIAGNOSIS — M7592 Shoulder lesion, unspecified, left shoulder: Secondary | ICD-10-CM | POA: Diagnosis not present

## 2022-09-23 LAB — HM DIABETES FOOT EXAM

## 2022-09-23 MED ORDER — ESCITALOPRAM OXALATE 5 MG PO TABS: 5.0000 mg | ORAL_TABLET | Freq: Every day | ORAL | 2 refills | Status: DC

## 2022-09-23 MED ORDER — EZETIMIBE 10 MG PO TABS: 10.0000 mg | ORAL_TABLET | Freq: Every day | ORAL | 3 refills | Status: DC

## 2022-09-23 NOTE — Progress Notes (Signed)
Patient ID: [name], female   DOB: [birth year], 61 y.o.   MRN: [id number]   Subjective:    Patient ID: Rachel Collins, female    DOB: Nov 23, 1961, 61 y.o.   MRN: [id number]   Patient here for  Chief Complaint  Patient presents with   Follow-up    7 week follow up   .   HPI Here to follow up regarding her blood pressure and cholesterol.  Also increased stress.  Discussed.  Work stress.  Anxiety attacks. Was on zoloft.  Off now.  Would like to try something else.  Feels needs something to help level things out.  Stays active.  Breathing stable.  4 weeks ago, left shoulder popped.  Now with limited rom.  Arm has felt numb.  Using salonpas.  Planning to see [facility] today. No abdominal pain or bowel change reported.  Discussed labs.     Past Medical History:  Diagnosis Date   Diabetes mellitus without complication (HCC)    Esophagitis    Gastritis    Gastroparesis    History of migraine headaches    Hypercholesterolemia    Hyperglycemia    Hypertension    Paget's bone disease    Past Surgical History:  Procedure Laterality Date   BREAST BIOPSY Left 2010   benign   CERVICAL DISC SURGERY     C5-C7 fusion   COLONOSCOPY WITH PROPOFOL N/A 02/13/2021   Procedure: COLONOSCOPY WITH PROPOFOL;  Surgeon: Robert Bellow, MD;  Location: [place];  Service: Endoscopy;  Laterality: N/A;   ESOPHAGOGASTRODUODENOSCOPY (EGD) WITH PROPOFOL N/A 02/13/2021   Procedure: ESOPHAGOGASTRODUODENOSCOPY (EGD) WITH PROPOFOL;  Surgeon: Robert Bellow, MD;  Location: [facility];  Service: Endoscopy;  Laterality: N/A;   TUBAL LIGATION     Family History  Problem Relation Age of Onset   Heart disease Father        myocardial infarction x 5   Prostate cancer Father    Uterine cancer Mother    COPD Mother    Diabetes Mother    Heart disease Mother        myocardial infarction   Breast cancer Mother 51   Heart disease Paternal Grandmother    Heart disease Maternal Grandfather     Social History   Socioeconomic History   Marital status: Married    Spouse name: Not on file   Number of children: 1   Years of education: Not on file   Highest education level: Not on file  Occupational History   Not on file  Tobacco Use   Smoking status: Never   Smokeless tobacco: Never  Vaping Use   Vaping Use: Never used  Substance and Sexual Activity   Alcohol use: Yes    Alcohol/week: 7.0 standard drinks of alcohol    Types: 7 Glasses of wine per week    Comment: 1 glass with dinner   Drug use: No   Sexual activity: Not on file  Other Topics Concern   Not on file  Social History Narrative   Not on file   Social Determinants of Health   Financial Resource Strain: Not on file  Food Insecurity: Not on file  Transportation Needs: Not on file  Physical Activity: Not on file  Stress: Not on file  Social Connections: Not on file     Review of Systems  Constitutional:  Negative for appetite change and unexpected weight change.  HENT:  Negative for congestion and sinus pressure.   Respiratory:  Negative for cough, chest tightness and shortness of breath.   Cardiovascular:  Negative for chest pain, palpitations and leg swelling.  Gastrointestinal:  Negative for abdominal pain, diarrhea, nausea and vomiting.  Genitourinary:  Negative for difficulty urinating and dysuria.  Musculoskeletal:  Negative for myalgias.       Left shoulder pain as outlined.  Limited rom.   Skin:  Negative for color change and rash.  Neurological:  Negative for dizziness, light-headedness and headaches.  Psychiatric/Behavioral:  Negative for agitation and dysphoric mood.        Increased stress as outlined.        Objective:     BP 110/60 (BP Location: Right Arm, Patient Position: Sitting, Cuff Size: Normal)   Pulse 87   Temp 99.2 F (37.3 C) (Oral)   Ht _0  (1.626 m)   Wt 150 lb (68 kg)   LMP  (LMP Unknown)   SpO2 96%   BMI 25.75 kg/m  Wt Readings from Last 3 Encounters:   09/23/22 150 lb (68 kg)  05/20/22 149 lb 12.8 oz (67.9 kg)  03/17/22 151 lb 9.6 oz (68.8 kg)    Physical Exam Vitals reviewed.  Constitutional:      General: She is not in acute distress.    Appearance: Normal appearance.  HENT:     Head: Normocephalic and atraumatic.     Right Ear: External ear normal.     Left Ear: External ear normal.  Eyes:     General: No scleral icterus.       Right eye: No discharge.        Left eye: No discharge.     Conjunctiva/sclera: Conjunctivae normal.  Neck:     Thyroid: No thyromegaly.  Cardiovascular:     Rate and Rhythm: Normal rate and regular rhythm.  Pulmonary:     Effort: No respiratory distress.     Breath sounds: Normal breath sounds. No wheezing.  Abdominal:     General: Bowel sounds are normal.     Palpations: Abdomen is soft.     Tenderness: There is no abdominal tenderness.  Musculoskeletal:        General: No swelling.     Cervical back: Neck supple. No tenderness.     Comments: Limited rom - left shoulder  Lymphadenopathy:     Cervical: No cervical adenopathy.  Skin:    Findings: No erythema or rash.  Neurological:     Mental Status: She is alert.  Psychiatric:        Mood and Affect: Mood normal.        Behavior: Behavior normal.      Outpatient Encounter Medications as of 09/23/2022  Medication Sig   amLODipine (NORVASC) 5 MG tablet TAKE 1 TABLET BY MOUTH EVERY DAY   escitalopram (LEXAPRO) 5 MG tablet Take 1 tablet (5 mg total) by mouth daily.   ezetimibe (ZETIA) 10 MG tablet Take 1 tablet (10 mg total) by mouth daily.   hydrochlorothiazide (HYDRODIURIL) 25 MG tablet Take 1 tablet (25 mg total) by mouth daily.   losartan (COZAAR) 100 MG tablet TAKE 1 TABLET BY MOUTH EVERY DAY   lovastatin (MEVACOR) 40 MG tablet TAKE 1 TABLET BY MOUTH EVERYDAY AT BEDTIME   meloxicam (MOBIC) 15 MG tablet SMARTSIG:1 Tablet(s) By Mouth Daily   [DISCONTINUED] sertraline (ZOLOFT) 50 MG tablet TAKE 1 AND 1/2 TABLETS BY MOUTH DAILY  (Patient not taking: Reported on 09/23/2022)   [DISCONTINUED] traZODone (DESYREL) 50 MG tablet TAKE 1 TO 2  TABLETS BY MOUTH AT BEDTIME (Patient not taking: Reported on 09/23/2022)   No facility-administered encounter medications on file as of 09/23/2022.     Lab Results  Component Value Date   WBC 5.7 09/19/2022   HGB 14.2 09/19/2022   HCT 42.9 09/19/2022   PLT 268.0 09/19/2022   GLUCOSE 122 (H) 09/19/2022   CHOL 220 (H) 09/19/2022   TRIG 211.0 (H) 09/19/2022   HDL 60.30 09/19/2022   LDLDIRECT 138.0 09/19/2022   LDLCALC 190 (H) 03/14/2022   ALT 32 09/19/2022   AST 21 09/19/2022   NA 137 09/19/2022   K 4.2 09/19/2022   CL 100 09/19/2022   CREATININE 0.83 09/19/2022   BUN 24 (H) 09/19/2022   CO2 25 09/19/2022   TSH 2.40 09/19/2022   HGBA1C 6.7 (H) 09/19/2022   MICROALBUR <0.7 09/23/2022    DG Chest 2 View  Result Date: 02/06/2022 CLINICAL DATA:  Chest pain, dizziness, blurred vision, elevated blood pressure, history hypertension EXAM: CHEST - 2 VIEW COMPARISON:  12/06/2010 FINDINGS: Normal heart size, mediastinal contours, and pulmonary vascularity. Lungs clear. No pulmonary infiltrate, pleural effusion, or pneumothorax. Prior cervical spine fusion. No acute osseous findings. IMPRESSION: No acute abnormalities. Electronically Signed   By: Lavonia Dana M.D.   On: 02/06/2022 14:34       Assessment & Plan:   Problem List Items Addressed This Visit     Colon cancer screening    Colonoscopy 02/13/21 - normal.  Recommend f/u colonoscopy in 10 years.        Hypercholesterolemia    On mevacor. Had problems with other statin medication. Discussed other treatment options.  Add zetia.  Low cholesterol diet and exercise.  Follow lipid panel and liver function tests.        Relevant Medications   ezetimibe (ZETIA) 10 MG tablet   Hypertension    Blood pressure has been under good control.  Continue losartan, hctz and amlodipine.  Follow pressures.  Follow metabolic panel.        Relevant Medications   ezetimibe (ZETIA) 10 MG tablet   Left shoulder pain    Pain and limited rom as outlined.  Planning to see [facility] today.        Paget disease of bone    Has been evaluated by endocrinology.  Recommended caclium and vitamin D.       Stress    Increased stress as outlined.  Discussed.  Off zoloft.  Start lexapro 39m q day.  Follow.  Get her back in soon to reassess.        Type 2 diabetes mellitus with hyperglycemia (HCC) - Primary    Low carb diet and exercise.  Follow met b and a1c.       Relevant Orders   Microalbumin / creatinine urine ratio (Completed)   Ambulatory referral to diabetic education   Vitamin D deficiency    Continue vitamin D supplements.       Other Visit Diagnoses     Need for immunization against influenza       Relevant Orders   Flu Vaccine QUAD 648moM (Fluarix, Fluzone & Alfiuria Quad PF) (Completed)        ChEinar PheasantMD

## 2022-09-24 LAB — MICROALBUMIN / CREATININE URINE RATIO
Creatinine,U: 50.3 mg/dL
Microalb Creat Ratio: 1.4 mg/g (ref 0.0–30.0)
Microalb, Ur: <0.7 mg/dL (ref 0.0–1.9)

## 2022-09-28 ENCOUNTER — Encounter: Payer: Self-pay | Admitting: Internal Medicine

## 2022-09-28 DIAGNOSIS — M25512 Pain in left shoulder: Secondary | ICD-10-CM | POA: Insufficient documentation

## 2022-09-28 NOTE — Assessment & Plan Note (Signed)
Blood pressure has been under good control.  Continue losartan, hctz and amlodipine.  Follow pressures.  Follow metabolic panel.

## 2022-09-28 NOTE — Assessment & Plan Note (Signed)
Pain and limited rom as outlined.  Planning to see [facility] today.

## 2022-09-28 NOTE — Assessment & Plan Note (Signed)
On mevacor. Had problems with other statin medication. Discussed other treatment options.  Add zetia.  Low cholesterol diet and exercise.  Follow lipid panel and liver function tests.

## 2022-09-28 NOTE — Assessment & Plan Note (Signed)
Low carb diet and exercise.  Follow met b and a1c.

## 2022-09-28 NOTE — Assessment & Plan Note (Signed)
Increased stress as outlined.  Discussed.  Off zoloft.  Start lexapro 5mg  q day.  Follow.  Get her back in soon to reassess.

## 2022-09-28 NOTE — Assessment & Plan Note (Signed)
Continue vitamin D supplements.  

## 2022-09-28 NOTE — Assessment & Plan Note (Signed)
Colonoscopy 02/13/21 - normal.  Recommend f/u colonoscopy in 10 years.   

## 2022-09-28 NOTE — Assessment & Plan Note (Signed)
Has been evaluated by endocrinology.  Recommended caclium and vitamin D.  

## 2022-10-03 ENCOUNTER — Other Ambulatory Visit: Payer: Self-pay | Admitting: Internal Medicine

## 2022-10-15 ENCOUNTER — Other Ambulatory Visit: Payer: Self-pay | Admitting: Internal Medicine

## 2022-10-22 ENCOUNTER — Other Ambulatory Visit: Payer: Self-pay | Admitting: Internal Medicine

## 2022-11-01 ENCOUNTER — Other Ambulatory Visit: Payer: Self-pay | Admitting: Internal Medicine

## 2022-11-04 ENCOUNTER — Encounter: Payer: Self-pay | Admitting: Internal Medicine

## 2022-11-04 ENCOUNTER — Ambulatory Visit: Payer: BC Managed Care – PPO | Admitting: Internal Medicine

## 2022-11-04 VITALS — BP 122/68 | HR 76 | Temp 98.3°F | Resp 17 | Ht 64.0 in | Wt 149.4 lb

## 2022-11-04 DIAGNOSIS — M889 Osteitis deformans of unspecified bone: Secondary | ICD-10-CM

## 2022-11-04 DIAGNOSIS — G479 Sleep disorder, unspecified: Secondary | ICD-10-CM

## 2022-11-04 DIAGNOSIS — E1165 Type 2 diabetes mellitus with hyperglycemia: Secondary | ICD-10-CM

## 2022-11-04 DIAGNOSIS — F439 Reaction to severe stress, unspecified: Secondary | ICD-10-CM

## 2022-11-04 DIAGNOSIS — Z1211 Encounter for screening for malignant neoplasm of colon: Secondary | ICD-10-CM | POA: Diagnosis not present

## 2022-11-04 DIAGNOSIS — M25512 Pain in left shoulder: Secondary | ICD-10-CM

## 2022-11-04 DIAGNOSIS — E7800 Pure hypercholesterolemia, unspecified: Secondary | ICD-10-CM | POA: Diagnosis not present

## 2022-11-04 DIAGNOSIS — I10 Essential (primary) hypertension: Secondary | ICD-10-CM | POA: Diagnosis not present

## 2022-11-04 MED ORDER — HYDROXYZINE HCL 10 MG PO TABS: 10.0000 mg | ORAL_TABLET | Freq: Every evening | ORAL | 0 refills | Status: DC | PRN

## 2022-11-04 NOTE — Progress Notes (Signed)
Patient ID: Rachel Collins, female   DOB: 1961/08/15, 61 y.o.   MRN: [id number]   Subjective:    Patient ID: Rachel Collins, female    DOB: March 02, 1961, 61 y.o.   MRN: [id number]   Patient here for  Chief Complaint  Patient presents with   Follow-up   .   HPI Here to follow up regarding blood pressure, cholesterol and increased stress.  Work stress.  Discussed.  Not sleeping.  Discussed treatment options.  Previously on zoloft.  Last visit - discussed trial of low dose lexapro. Discussed trial of hydroxyzine - to help with sleep.  Tries to stay active.  No chest pain or sob reported.  No abdominal pain or increased bowel issues reported.  Saw ortho - shoulder.     Past Medical History:  Diagnosis Date   Diabetes mellitus without complication (HCC)    Esophagitis    Gastritis    Gastroparesis    History of migraine headaches    Hypercholesterolemia    Hyperglycemia    Hypertension    Paget's bone disease    Past Surgical History:  Procedure Laterality Date   BREAST BIOPSY Left 2010   benign   CERVICAL DISC SURGERY     C5-C7 fusion   COLONOSCOPY WITH PROPOFOL N/A 02/13/2021   Procedure: COLONOSCOPY WITH PROPOFOL;  Surgeon: Robert Bellow, MD;  Location: [place];  Service: Endoscopy;  Laterality: N/A;   ESOPHAGOGASTRODUODENOSCOPY (EGD) WITH PROPOFOL N/A 02/13/2021   Procedure: ESOPHAGOGASTRODUODENOSCOPY (EGD) WITH PROPOFOL;  Surgeon: Robert Bellow, MD;  Location: [facility];  Service: Endoscopy;  Laterality: N/A;   TUBAL LIGATION     Family History  Problem Relation Age of Onset   Heart disease Father        myocardial infarction x 5   Prostate cancer Father    Uterine cancer Mother    COPD Mother    Diabetes Mother    Heart disease Mother        myocardial infarction   Breast cancer Mother 97   Heart disease Paternal Grandmother    Heart disease Maternal Grandfather    Social History   Socioeconomic History   Marital status: Married    Spouse  name: Not on file   Number of children: 1   Years of education: Not on file   Highest education level: Not on file  Occupational History   Not on file  Tobacco Use   Smoking status: Never   Smokeless tobacco: Never  Vaping Use   Vaping Use: Never used  Substance and Sexual Activity   Alcohol use: Yes    Alcohol/week: 7.0 standard drinks of alcohol    Types: 7 Glasses of wine per week    Comment: 1 glass with dinner   Drug use: No   Sexual activity: Not on file  Other Topics Concern   Not on file  Social History Narrative   Not on file   Social Determinants of Health   Financial Resource Strain: Not on file  Food Insecurity: Not on file  Transportation Needs: Not on file  Physical Activity: Not on file  Stress: Not on file  Social Connections: Not on file     Review of Systems  Constitutional:  Negative for appetite change and unexpected weight change.  HENT:  Negative for congestion and sinus pressure.   Respiratory:  Negative for cough, chest tightness and shortness of breath.   Cardiovascular:  Negative for chest pain, palpitations and leg swelling.  Gastrointestinal:  Negative for abdominal pain, diarrhea, nausea and vomiting.  Genitourinary:  Negative for difficulty urinating and dysuria.  Musculoskeletal:  Negative for joint swelling and myalgias.  Skin:  Negative for color change and rash.  Neurological:  Negative for dizziness and headaches.  Psychiatric/Behavioral:  Positive for sleep disturbance.        Increased stress        Objective:     BP 122/68 (BP Location: Left Arm, Patient Position: Sitting, Cuff Size: Small)   Pulse 76   Temp 98.3 F (36.8 C) (Temporal)   Resp 17   Ht 5' 4" (1.626 m)   Wt 149 lb 6.4 oz (67.8 kg)   LMP  (LMP Unknown)   SpO2 98%   BMI 25.64 kg/m  Wt Readings from Last 3 Encounters:  11/04/22 149 lb 6.4 oz (67.8 kg)  09/23/22 150 lb (68 kg)  05/20/22 149 lb 12.8 oz (67.9 kg)    Physical Exam Vitals reviewed.   Constitutional:      General: She is not in acute distress.    Appearance: Normal appearance.  HENT:     Head: Normocephalic and atraumatic.     Right Ear: External ear normal.     Left Ear: External ear normal.  Eyes:     General: No scleral icterus.       Right eye: No discharge.        Left eye: No discharge.     Conjunctiva/sclera: Conjunctivae normal.  Neck:     Thyroid: No thyromegaly.  Cardiovascular:     Rate and Rhythm: Normal rate and regular rhythm.  Pulmonary:     Effort: No respiratory distress.     Breath sounds: Normal breath sounds. No wheezing.  Abdominal:     General: Bowel sounds are normal.     Palpations: Abdomen is soft.     Tenderness: There is no abdominal tenderness.  Musculoskeletal:        General: No swelling or tenderness.     Cervical back: Neck supple. No tenderness.  Lymphadenopathy:     Cervical: No cervical adenopathy.  Skin:    Findings: No erythema or rash.  Neurological:     Mental Status: She is alert.  Psychiatric:        Mood and Affect: Mood normal.        Behavior: Behavior normal.      Outpatient Encounter Medications as of 11/04/2022  Medication Sig   amLODipine (NORVASC) 5 MG tablet TAKE 1 TABLET BY MOUTH EVERY DAY   ezetimibe (ZETIA) 10 MG tablet Take 1 tablet (10 mg total) by mouth daily.   hydrochlorothiazide (HYDRODIURIL) 25 MG tablet TAKE 1 TABLET (25 MG TOTAL) BY MOUTH DAILY.   hydrOXYzine (ATARAX) 10 MG tablet Take 1 tablet (10 mg total) by mouth at bedtime as needed.   losartan (COZAAR) 100 MG tablet TAKE 1 TABLET BY MOUTH EVERY DAY   lovastatin (MEVACOR) 40 MG tablet TAKE 1 TABLET BY MOUTH EVERYDAY AT BEDTIME   meloxicam (MOBIC) 15 MG tablet SMARTSIG:1 Tablet(s) By Mouth Daily   [DISCONTINUED] escitalopram (LEXAPRO) 5 MG tablet TAKE 1 TABLET (5 MG TOTAL) BY MOUTH DAILY. (Patient not taking: Reported on 11/04/2022)   No facility-administered encounter medications on file as of 11/04/2022.     Lab Results   Component Value Date   WBC 5.7 09/19/2022   HGB 14.2 09/19/2022   HCT 42.9 09/19/2022   PLT 268.0 09/19/2022   GLUCOSE 122 (H) 09/19/2022  CHOL 220 (H) 09/19/2022   TRIG 211.0 (H) 09/19/2022   HDL 60.30 09/19/2022   LDLDIRECT 138.0 09/19/2022   LDLCALC 190 (H) 03/14/2022   ALT 32 09/19/2022   AST 21 09/19/2022   NA 137 09/19/2022   K 4.2 09/19/2022   CL 100 09/19/2022   CREATININE 0.83 09/19/2022   BUN 24 (H) 09/19/2022   CO2 25 09/19/2022   TSH 2.40 09/19/2022   HGBA1C 6.7 (H) 09/19/2022   MICROALBUR <0.7 09/23/2022    DG Chest 2 View  Result Date: 02/06/2022 CLINICAL DATA:  Chest pain, dizziness, blurred vision, elevated blood pressure, history hypertension EXAM: CHEST - 2 VIEW COMPARISON:  12/06/2010 FINDINGS: Normal heart size, mediastinal contours, and pulmonary vascularity. Lungs clear. No pulmonary infiltrate, pleural effusion, or pneumothorax. Prior cervical spine fusion. No acute osseous findings. IMPRESSION: No acute abnormalities. Electronically Signed   By: Lavonia Dana M.D.   On: 02/06/2022 14:34       Assessment & Plan:   Problem List Items Addressed This Visit     Colon cancer screening    Colonoscopy 02/13/21 - normal.  Recommend f/u colonoscopy in 10 years.        Difficulty sleeping    Has tried trazodone.  Discussed.  Has been on zoloft.  Last visit, recommended trial of lexapro.  Feels needs something to help break cycle - not sleeping.  Trial of hydroxyzine.  Low dose.  Follow.       Hypercholesterolemia    On mevacor. Had problems with other statin medication. Have discussed other treatment options.  Discussed adding zetia last visit.  Reported - not taking.   Low cholesterol diet and exercise.  Follow lipid panel and liver function tests.        Relevant Orders   Hepatic function panel   Lipid panel   Hypertension - Primary    Blood pressure has been under good control.  Continue losartan, hctz and amlodipine.  Follow pressures.  Follow  metabolic panel.       Relevant Orders   Basic metabolic panel   Left shoulder pain    Saw ortho.       Paget disease of bone    Has been evaluated by endocrinology.  Recommended caclium and vitamin D.       Stress    Increased stress as outlined.  Discussed.  Off zoloft.  Not taking trazodone.  Feels needs something to help sleep.  Trial of hydroxyzine. Follow.        Type 2 diabetes mellitus with hyperglycemia (HCC)    Low carb diet and exercise.  Follow met b and a1c.       Relevant Orders   Hemoglobin A1c     Einar Pheasant, MD

## 2022-11-16 ENCOUNTER — Encounter: Payer: Self-pay | Admitting: Internal Medicine

## 2022-11-16 NOTE — Assessment & Plan Note (Signed)
Increased stress as outlined.  Discussed.  Off zoloft.  Not taking trazodone.  Feels needs something to help sleep.  Trial of hydroxyzine. Follow.

## 2022-11-16 NOTE — Assessment & Plan Note (Signed)
Has been evaluated by endocrinology.  Recommended caclium and vitamin D.  

## 2022-11-16 NOTE — Assessment & Plan Note (Signed)
Low carb diet and exercise.  Follow met b and a1c.  

## 2022-11-16 NOTE — Assessment & Plan Note (Signed)
Blood pressure has been under good control.  Continue losartan, hctz and amlodipine.  Follow pressures.  Follow metabolic panel.

## 2022-11-16 NOTE — Assessment & Plan Note (Signed)
On mevacor. Had problems with other statin medication. Have discussed other treatment options.  Discussed adding zetia last visit.  Reported - not taking.   Low cholesterol diet and exercise.  Follow lipid panel and liver function tests.

## 2022-11-16 NOTE — Assessment & Plan Note (Signed)
Colonoscopy 02/13/21 - normal.  Recommend f/u colonoscopy in 10 years.   

## 2022-11-16 NOTE — Assessment & Plan Note (Signed)
Saw ortho.  

## 2022-11-16 NOTE — Assessment & Plan Note (Signed)
Has tried trazodone.  Discussed.  Has been on zoloft.  Last visit, recommended trial of lexapro.  Feels needs something to help break cycle - not sleeping.  Trial of hydroxyzine.  Low dose.  Follow.

## 2022-11-26 ENCOUNTER — Other Ambulatory Visit: Payer: Self-pay | Admitting: Internal Medicine

## 2022-11-26 NOTE — Telephone Encounter (Signed)
Last ordered 30 day supply are you okay with 90 day supply requested?

## 2022-11-27 NOTE — Telephone Encounter (Signed)
Rx ok'd for hydroxyzine #90 with no refills.

## 2022-12-11 ENCOUNTER — Other Ambulatory Visit: Payer: Self-pay | Admitting: Internal Medicine

## 2022-12-19 ENCOUNTER — Other Ambulatory Visit: Payer: Self-pay | Admitting: Internal Medicine

## 2023-01-01 ENCOUNTER — Other Ambulatory Visit (INDEPENDENT_AMBULATORY_CARE_PROVIDER_SITE_OTHER): Payer: BC Managed Care – PPO

## 2023-01-01 DIAGNOSIS — I10 Essential (primary) hypertension: Secondary | ICD-10-CM

## 2023-01-01 DIAGNOSIS — E7800 Pure hypercholesterolemia, unspecified: Secondary | ICD-10-CM | POA: Diagnosis not present

## 2023-01-01 DIAGNOSIS — E1165 Type 2 diabetes mellitus with hyperglycemia: Secondary | ICD-10-CM | POA: Diagnosis not present

## 2023-01-01 LAB — BASIC METABOLIC PANEL
BUN: 22 mg/dL (ref 6–23)
CO2: 25 mEq/L (ref 19–32)
Calcium: 9.3 mg/dL (ref 8.4–10.5)
Chloride: 103 mEq/L (ref 96–112)
Creatinine, Ser: 0.79 mg/dL (ref 0.40–1.20)
GFR: 79.77 mL/min (ref >60.00)
Glucose, Bld: 170 mg/dL — ABNORMAL HIGH (ref 70–99)
Potassium: 4.2 mEq/L (ref 3.5–5.1)
Sodium: 137 mEq/L (ref 135–145)

## 2023-01-01 LAB — HEPATIC FUNCTION PANEL
ALT: 34 U/L (ref 0–35)
AST: 27 U/L (ref 0–37)
Albumin: 4.7 g/dL (ref 3.5–5.2)
Alkaline Phosphatase: 53 U/L (ref 39–117)
Bilirubin, Direct: 0.1 mg/dL (ref 0.0–0.3)
Total Bilirubin: 0.6 mg/dL (ref 0.2–1.2)
Total Protein: 6.6 g/dL (ref 6.0–8.3)

## 2023-01-01 LAB — LIPID PANEL
Cholesterol: 172 mg/dL (ref 0–200)
HDL: 52 mg/dL (ref >39.00)
LDL Cholesterol: 89 mg/dL (ref 0–99)
NonHDL: 120.15
Total CHOL/HDL Ratio: 3
Triglycerides: 154 mg/dL — ABNORMAL HIGH (ref 0.0–149.0)
VLDL: 30.8 mg/dL (ref 0.0–40.0)

## 2023-01-01 LAB — HEMOGLOBIN A1C: Hgb A1c MFr Bld: 6.6 % — ABNORMAL HIGH (ref 4.6–6.5)

## 2023-01-03 DIAGNOSIS — J018 Other acute sinusitis: Secondary | ICD-10-CM | POA: Diagnosis not present

## 2023-01-03 DIAGNOSIS — Z6825 Body mass index (BMI) 25.0-25.9, adult: Secondary | ICD-10-CM | POA: Diagnosis not present

## 2023-01-03 DIAGNOSIS — I10 Essential (primary) hypertension: Secondary | ICD-10-CM | POA: Diagnosis not present

## 2023-01-05 ENCOUNTER — Ambulatory Visit: Payer: BC Managed Care – PPO | Admitting: Internal Medicine

## 2023-01-05 VITALS — BP 138/80 | HR 88 | Temp 98.4°F | Resp 16 | Ht 64.0 in | Wt 151.0 lb

## 2023-01-05 DIAGNOSIS — E7800 Pure hypercholesterolemia, unspecified: Secondary | ICD-10-CM | POA: Diagnosis not present

## 2023-01-05 DIAGNOSIS — Z1211 Encounter for screening for malignant neoplasm of colon: Secondary | ICD-10-CM | POA: Diagnosis not present

## 2023-01-05 DIAGNOSIS — I10 Essential (primary) hypertension: Secondary | ICD-10-CM | POA: Diagnosis not present

## 2023-01-05 DIAGNOSIS — R87810 Cervical high risk human papillomavirus (HPV) DNA test positive: Secondary | ICD-10-CM

## 2023-01-05 DIAGNOSIS — M889 Osteitis deformans of unspecified bone: Secondary | ICD-10-CM

## 2023-01-05 DIAGNOSIS — F439 Reaction to severe stress, unspecified: Secondary | ICD-10-CM

## 2023-01-05 DIAGNOSIS — E1165 Type 2 diabetes mellitus with hyperglycemia: Secondary | ICD-10-CM

## 2023-01-05 MED ORDER — LOVASTATIN 40 MG PO TABS: ORAL_TABLET | ORAL | 1 refills | Status: DC

## 2023-01-05 MED ORDER — HYDROCHLOROTHIAZIDE 25 MG PO TABS: 25.0000 mg | ORAL_TABLET | Freq: Every day | ORAL | 1 refills | Status: DC

## 2023-01-05 MED ORDER — LOSARTAN POTASSIUM 100 MG PO TABS: 100.0000 mg | ORAL_TABLET | Freq: Every day | ORAL | 1 refills | Status: DC

## 2023-01-05 MED ORDER — EZETIMIBE 10 MG PO TABS: 10.0000 mg | ORAL_TABLET | Freq: Every day | ORAL | 1 refills | Status: AC

## 2023-01-05 MED ORDER — AMLODIPINE BESYLATE 5 MG PO TABS: 5.0000 mg | ORAL_TABLET | Freq: Every day | ORAL | 1 refills | Status: DC

## 2023-01-05 NOTE — Progress Notes (Signed)
Patient ID: Rachel Collins, female   DOB: 19-May-1961, 62 y.o.   MRN: [id number]   Subjective:    Patient ID: Rachel Collins, female    DOB: 08-30-1961, 62 y.o.   MRN: [id number]   Patient here for  Here for a scheduled follow up.   Marland Kitchen   HPI Here to follow up regarding blood pressure, cholesterol and increased stress.  Work stress.  Discussed.  Was having issues with not sleeping.  Discussed treatment options.  Previously on zoloft.   Off not. Last visit -discussed trial of hydroxyzine - to help with sleep.  Tries to stay active.  No chest pain or sob reported.  No abdominal pain or increased bowel issues reported.  Recently (12/16/22) evaluated [facility].  Sinus congestion/head pressure.  Treated with coricidin and doxycycline.  Is better.  Discussed labs.  A1c 6.6.     Past Medical History:  Diagnosis Date   Diabetes mellitus without complication (HCC)    Esophagitis    Gastritis    Gastroparesis    History of migraine headaches    Hypercholesterolemia    Hyperglycemia    Hypertension    Paget's bone disease    Past Surgical History:  Procedure Laterality Date   BREAST BIOPSY Left 2010   benign   CERVICAL DISC SURGERY     C5-C7 fusion   COLONOSCOPY WITH PROPOFOL N/A 02/13/2021   Procedure: COLONOSCOPY WITH PROPOFOL;  Surgeon: Robert Bellow, MD;  Location: [place];  Service: Endoscopy;  Laterality: N/A;   ESOPHAGOGASTRODUODENOSCOPY (EGD) WITH PROPOFOL N/A 02/13/2021   Procedure: ESOPHAGOGASTRODUODENOSCOPY (EGD) WITH PROPOFOL;  Surgeon: Robert Bellow, MD;  Location: [facility];  Service: Endoscopy;  Laterality: N/A;   TUBAL LIGATION     Family History  Problem Relation Age of Onset   Heart disease Father        myocardial infarction x 5   Prostate cancer Father    Uterine cancer Mother    COPD Mother    Diabetes Mother    Heart disease Mother        myocardial infarction   Breast cancer Mother 75   Heart disease Paternal Grandmother    Heart disease  Maternal Grandfather    Social History   Socioeconomic History   Marital status: Married    Spouse name: Not on file   Number of children: 1   Years of education: Not on file   Highest education level: Not on file  Occupational History   Not on file  Tobacco Use   Smoking status: Never   Smokeless tobacco: Never  Vaping Use   Vaping Use: Never used  Substance and Sexual Activity   Alcohol use: Yes    Alcohol/week: 7.0 standard drinks of alcohol    Types: 7 Glasses of wine per week    Comment: 1 glass with dinner   Drug use: No   Sexual activity: Not on file  Other Topics Concern   Not on file  Social History Narrative   Not on file   Social Determinants of Health   Financial Resource Strain: Not on file  Food Insecurity: Not on file  Transportation Needs: Not on file  Physical Activity: Not on file  Stress: Not on file  Social Connections: Not on file     Review of Systems  Constitutional:  Negative for appetite change and unexpected weight change.  HENT:  Negative for congestion and sinus pressure.   Respiratory:  Negative for cough, chest  tightness and shortness of breath.   Cardiovascular:  Negative for chest pain, palpitations and leg swelling.  Gastrointestinal:  Negative for abdominal pain, diarrhea, nausea and vomiting.  Genitourinary:  Negative for difficulty urinating and dysuria.  Musculoskeletal:  Negative for joint swelling and myalgias.  Skin:  Negative for color change and rash.  Neurological:  Negative for dizziness.       No increased headache currently.   Psychiatric/Behavioral:  Negative for agitation, dysphoric mood and sleep disturbance.        Increased stress        Objective:     BP 138/80 (BP Location: Left Arm, Patient Position: Sitting, Cuff Size: Normal)   Pulse 88   Temp 98.4 F (36.9 C) (Oral)   Resp 16   Wt 151 lb (68.5 kg)   LMP  (LMP Unknown)   SpO2 98%   BMI 25.92 kg/m  Wt Readings from Last 3 Encounters:  01/05/23  151 lb (68.5 kg)  11/04/22 149 lb 6.4 oz (67.8 kg)  09/23/22 150 lb (68 kg)    Physical Exam Vitals reviewed.  Constitutional:      General: She is not in acute distress.    Appearance: Normal appearance.  HENT:     Head: Normocephalic and atraumatic.     Right Ear: External ear normal.     Left Ear: External ear normal.  Eyes:     General: No scleral icterus.       Right eye: No discharge.        Left eye: No discharge.     Conjunctiva/sclera: Conjunctivae normal.  Neck:     Thyroid: No thyromegaly.  Cardiovascular:     Rate and Rhythm: Normal rate and regular rhythm.  Pulmonary:     Effort: No respiratory distress.     Breath sounds: Normal breath sounds. No wheezing.  Abdominal:     General: Bowel sounds are normal.     Palpations: Abdomen is soft.     Tenderness: There is no abdominal tenderness.  Musculoskeletal:        General: No swelling or tenderness.     Cervical back: Neck supple. No tenderness.  Lymphadenopathy:     Cervical: No cervical adenopathy.  Skin:    Findings: No erythema or rash.  Neurological:     Mental Status: She is alert.  Psychiatric:        Mood and Affect: Mood normal.        Behavior: Behavior normal.      Outpatient Encounter Medications as of 01/05/2023  Medication Sig   meloxicam (MOBIC) 15 MG tablet SMARTSIG:1 Tablet(s) By Mouth Daily   [DISCONTINUED] amLODipine (NORVASC) 5 MG tablet TAKE 1 TABLET BY MOUTH EVERY DAY   [DISCONTINUED] ezetimibe (ZETIA) 10 MG tablet TAKE 1 TABLET BY MOUTH EVERY DAY   [DISCONTINUED] hydrochlorothiazide (HYDRODIURIL) 25 MG tablet TAKE 1 TABLET (25 MG TOTAL) BY MOUTH DAILY.   [DISCONTINUED] losartan (COZAAR) 100 MG tablet TAKE 1 TABLET BY MOUTH EVERY DAY   [DISCONTINUED] lovastatin (MEVACOR) 40 MG tablet TAKE 1 TABLET BY MOUTH EVERYDAY AT BEDTIME   amLODipine (NORVASC) 5 MG tablet Take 1 tablet (5 mg total) by mouth daily.   ezetimibe (ZETIA) 10 MG tablet Take 1 tablet (10 mg total) by mouth daily.    hydrochlorothiazide (HYDRODIURIL) 25 MG tablet Take 1 tablet (25 mg total) by mouth daily.   losartan (COZAAR) 100 MG tablet Take 1 tablet (100 mg total) by mouth daily.   lovastatin (MEVACOR)  40 MG tablet TAKE 1 TABLET BY MOUTH EVERYDAY AT BEDTIME   [DISCONTINUED] hydrOXYzine (ATARAX) 10 MG tablet TAKE 1 TABLET BY MOUTH AT BEDTIME AS NEEDED. (Patient not taking: Reported on 01/05/2023)   No facility-administered encounter medications on file as of 01/05/2023.     Lab Results  Component Value Date   WBC 5.7 09/19/2022   HGB 14.2 09/19/2022   HCT 42.9 09/19/2022   PLT 268.0 09/19/2022   GLUCOSE 170 (H) 01/01/2023   CHOL 172 01/01/2023   TRIG 154.0 (H) 01/01/2023   HDL 52.00 01/01/2023   LDLDIRECT 138.0 09/19/2022   LDLCALC 89 01/01/2023   ALT 34 01/01/2023   AST 27 01/01/2023   NA 137 01/01/2023   K 4.2 01/01/2023   CL 103 01/01/2023   CREATININE 0.79 01/01/2023   BUN 22 01/01/2023   CO2 25 01/01/2023   TSH 2.40 09/19/2022   HGBA1C 6.6 (H) 01/01/2023   MICROALBUR <0.7 09/23/2022    DG Chest 2 View  Result Date: 02/06/2022 CLINICAL DATA:  Chest pain, dizziness, blurred vision, elevated blood pressure, history hypertension EXAM: CHEST - 2 VIEW COMPARISON:  12/06/2010 FINDINGS: Normal heart size, mediastinal contours, and pulmonary vascularity. Lungs clear. No pulmonary infiltrate, pleural effusion, or pneumothorax. Prior cervical spine fusion. No acute osseous findings. IMPRESSION: No acute abnormalities. Electronically Signed   By: Lavonia Dana M.D.   On: 02/06/2022 14:34       Assessment & Plan:   Problem List Items Addressed This Visit     Cervical high risk HPV (human papillomavirus) test positive - Primary    PAP 09/26/20 - negative with negative HPV.       Colon cancer screening    Colonoscopy 02/13/21 - normal.  Recommend f/u colonoscopy in 10 years.        Hypercholesterolemia    On mevacor. Had problems with other statin medication. Have discussed other treatment  options.  Discussed zetia. Low cholesterol diet and exercise.  Follow lipid panel and liver function tests.        Relevant Medications   amLODipine (NORVASC) 5 MG tablet   ezetimibe (ZETIA) 10 MG tablet   hydrochlorothiazide (HYDRODIURIL) 25 MG tablet   losartan (COZAAR) 100 MG tablet   lovastatin (MEVACOR) 40 MG tablet   Other Relevant Orders   Lipid panel   Hepatic function panel   Hypertension    Blood pressure as outlined. Continue losartan, hctz and amlodipine.  Follow pressures.  Follow metabolic panel.       Relevant Medications   amLODipine (NORVASC) 5 MG tablet   ezetimibe (ZETIA) 10 MG tablet   hydrochlorothiazide (HYDRODIURIL) 25 MG tablet   losartan (COZAAR) 100 MG tablet   lovastatin (MEVACOR) 40 MG tablet   Paget disease of bone    Has been evaluated by endocrinology.  Recommended caclium and vitamin D.       Stress    Increased stress as outlined.  Discussed.  Off zoloft.  Not taking trazodone. Discussed.  Follow.  Notify me if feels needs further intervention.       Type 2 diabetes mellitus with hyperglycemia (HCC)    Low carb diet and exercise.  Follow met b and a1c.       Relevant Medications   losartan (COZAAR) 100 MG tablet   lovastatin (MEVACOR) 40 MG tablet   Other Relevant Orders   Hemoglobin N3I   Basic metabolic panel    Einar Pheasant, MD

## 2023-01-10 ENCOUNTER — Encounter: Payer: Self-pay | Admitting: Internal Medicine

## 2023-01-10 NOTE — Assessment & Plan Note (Signed)
Low carb diet and exercise.  Follow met b and a1c.  

## 2023-01-10 NOTE — Assessment & Plan Note (Signed)
Has been evaluated by endocrinology.  Recommended caclium and vitamin D.

## 2023-01-10 NOTE — Assessment & Plan Note (Signed)
On mevacor. Had problems with other statin medication. Have discussed other treatment options.  Discussed zetia. Low cholesterol diet and exercise.  Follow lipid panel and liver function tests.

## 2023-01-10 NOTE — Assessment & Plan Note (Signed)
PAP 09/26/20 - negative with negative HPV.

## 2023-01-10 NOTE — Assessment & Plan Note (Signed)
Blood pressure as outlined.  Continue losartan, hctz and amlodipine.  Follow pressures.  Follow metabolic panel.  

## 2023-01-10 NOTE — Assessment & Plan Note (Signed)
Increased stress as outlined.  Discussed.  Off zoloft.  Not taking trazodone. Discussed.  Follow.  Notify me if feels needs further intervention.

## 2023-01-10 NOTE — Assessment & Plan Note (Signed)
Colonoscopy 02/13/21 - normal.  Recommend f/u colonoscopy in 10 years.

## 2023-02-03 ENCOUNTER — Ambulatory Visit
Admission: RE | Admit: 2023-02-03 | Discharge: 2023-02-03 | Disposition: A | Discharge status: Home / Self Care | Payer: BC Managed Care – PPO | Source: Ambulatory Visit | Attending: Internal Medicine | Admitting: Internal Medicine

## 2023-02-03 DIAGNOSIS — Z1231 Encounter for screening mammogram for malignant neoplasm of breast: Secondary | ICD-10-CM | POA: Insufficient documentation

## 2023-02-22 ENCOUNTER — Other Ambulatory Visit: Payer: Self-pay | Admitting: Internal Medicine

## 2023-03-17 ENCOUNTER — Encounter: Payer: Self-pay | Admitting: Internal Medicine

## 2023-03-18 NOTE — Telephone Encounter (Signed)
It would probably be best to talk with her and examine and see what tests are needed prior to drawing lab.  If needs earlier appt, let us know

## 2023-03-18 NOTE — Telephone Encounter (Signed)
Patient aware of below and was ok to wait until her appt to discuss doing further lab tests

## 2023-03-18 NOTE — Telephone Encounter (Signed)
Patient has labs in a few weeks and a f/u with you. Are you ok with ordering labs or do you want to see her first and then determine what additional tests should be ordered?

## 2023-04-09 ENCOUNTER — Encounter: Payer: Self-pay | Admitting: Internal Medicine

## 2023-04-15 ENCOUNTER — Other Ambulatory Visit: Payer: BC Managed Care – PPO

## 2023-04-20 ENCOUNTER — Encounter: Payer: BC Managed Care – PPO | Admitting: Internal Medicine

## 2023-05-16 DIAGNOSIS — Z6825 Body mass index (BMI) 25.0-25.9, adult: Secondary | ICD-10-CM | POA: Diagnosis not present

## 2023-05-16 DIAGNOSIS — J209 Acute bronchitis, unspecified: Secondary | ICD-10-CM | POA: Diagnosis not present

## 2023-05-22 ENCOUNTER — Other Ambulatory Visit: Payer: Self-pay | Admitting: Family

## 2023-06-03 ENCOUNTER — Other Ambulatory Visit: Payer: BC Managed Care – PPO

## 2023-06-09 ENCOUNTER — Encounter: Payer: BC Managed Care – PPO | Admitting: Internal Medicine

## 2023-06-29 ENCOUNTER — Other Ambulatory Visit: Payer: BC Managed Care – PPO

## 2023-07-07 ENCOUNTER — Encounter: Payer: BC Managed Care – PPO | Admitting: Internal Medicine

## 2023-07-21 ENCOUNTER — Other Ambulatory Visit: Payer: BC Managed Care – PPO

## 2023-07-23 ENCOUNTER — Other Ambulatory Visit: Payer: Self-pay | Admitting: Internal Medicine

## 2023-07-23 ENCOUNTER — Encounter: Payer: BC Managed Care – PPO | Admitting: Internal Medicine

## 2023-07-29 ENCOUNTER — Other Ambulatory Visit: Payer: Self-pay | Admitting: Internal Medicine

## 2023-08-06 ENCOUNTER — Other Ambulatory Visit: Payer: Self-pay | Admitting: Internal Medicine

## 2023-08-06 ENCOUNTER — Encounter (INDEPENDENT_AMBULATORY_CARE_PROVIDER_SITE_OTHER): Payer: Self-pay

## 2023-08-30 ENCOUNTER — Other Ambulatory Visit: Payer: Self-pay | Admitting: Internal Medicine

## 2023-09-02 ENCOUNTER — Encounter: Payer: Self-pay | Admitting: Internal Medicine

## 2023-09-03 ENCOUNTER — Telehealth: Payer: Self-pay

## 2023-09-03 NOTE — Telephone Encounter (Signed)
Called and discussed with pt.  She informed me that she did not want to change PCPs.  She wanted to know if she needed to see someone else for her physical.  Discussed.  Will do her physical next week.  Appt scheduled.  Lab appt scheduled prior to physical.

## 2023-09-03 NOTE — Telephone Encounter (Signed)
Patient states she received a message saying she needs to reschedule her appointment.  Patient states she is concerned because she has had to reschedule four times.  Patient states she has been with Dr. Dale Flatwoods for a long time, but she is wondering if she needs to change providers.

## 2023-09-09 ENCOUNTER — Other Ambulatory Visit (INDEPENDENT_AMBULATORY_CARE_PROVIDER_SITE_OTHER): Payer: BC Managed Care – PPO

## 2023-09-09 DIAGNOSIS — E7800 Pure hypercholesterolemia, unspecified: Secondary | ICD-10-CM

## 2023-09-09 DIAGNOSIS — E1165 Type 2 diabetes mellitus with hyperglycemia: Secondary | ICD-10-CM | POA: Diagnosis not present

## 2023-09-09 LAB — BASIC METABOLIC PANEL WITH GFR
BUN: 23 mg/dL (ref 6–23)
CO2: 27 meq/L (ref 19–32)
Calcium: 9.4 mg/dL (ref 8.4–10.5)
Chloride: 104 meq/L (ref 96–112)
Creatinine, Ser: 0.75 mg/dL (ref 0.40–1.20)
GFR: 84.5 mL/min (ref >60.00)
Glucose, Bld: 114 mg/dL — ABNORMAL HIGH (ref 70–99)
Potassium: 3.8 meq/L (ref 3.5–5.1)
Sodium: 139 meq/L (ref 135–145)

## 2023-09-09 LAB — HEPATIC FUNCTION PANEL
ALT: 28 U/L (ref 0–35)
AST: 20 U/L (ref 0–37)
Albumin: 4.4 g/dL (ref 3.5–5.2)
Alkaline Phosphatase: 50 U/L (ref 39–117)
Bilirubin, Direct: 0 mg/dL (ref 0.0–0.3)
Total Bilirubin: 0.5 mg/dL (ref 0.2–1.2)
Total Protein: 6.5 g/dL (ref 6.0–8.3)

## 2023-09-09 LAB — LIPID PANEL
Cholesterol: 208 mg/dL — ABNORMAL HIGH (ref 0–200)
HDL: 52.2 mg/dL (ref >39.00)
LDL Cholesterol: 123 mg/dL — ABNORMAL HIGH (ref 0–99)
NonHDL: 155.4
Total CHOL/HDL Ratio: 4
Triglycerides: 164 mg/dL — ABNORMAL HIGH (ref 0.0–149.0)
VLDL: 32.8 mg/dL (ref 0.0–40.0)

## 2023-09-09 LAB — HEMOGLOBIN A1C: Hgb A1c MFr Bld: 6.5 % (ref 4.6–6.5)

## 2023-09-10 ENCOUNTER — Ambulatory Visit (INDEPENDENT_AMBULATORY_CARE_PROVIDER_SITE_OTHER): Payer: BC Managed Care – PPO | Admitting: Internal Medicine

## 2023-09-10 ENCOUNTER — Other Ambulatory Visit (HOSPITAL_COMMUNITY)
Admission: RE | Admit: 2023-09-10 | Discharge: 2023-09-10 | Disposition: A | Discharge status: Home / Self Care | Payer: BC Managed Care – PPO | Source: Ambulatory Visit | Attending: Internal Medicine | Admitting: Internal Medicine

## 2023-09-10 ENCOUNTER — Encounter: Payer: Self-pay | Admitting: Internal Medicine

## 2023-09-10 VITALS — BP 138/72 | HR 78 | Temp 98.1°F | Ht 63.0 in | Wt 153.0 lb

## 2023-09-10 DIAGNOSIS — F439 Reaction to severe stress, unspecified: Secondary | ICD-10-CM

## 2023-09-10 DIAGNOSIS — M25542 Pain in joints of left hand: Secondary | ICD-10-CM

## 2023-09-10 DIAGNOSIS — M25541 Pain in joints of right hand: Secondary | ICD-10-CM | POA: Diagnosis not present

## 2023-09-10 DIAGNOSIS — M25551 Pain in right hip: Secondary | ICD-10-CM

## 2023-09-10 DIAGNOSIS — M889 Osteitis deformans of unspecified bone: Secondary | ICD-10-CM

## 2023-09-10 DIAGNOSIS — Z23 Encounter for immunization: Secondary | ICD-10-CM | POA: Diagnosis not present

## 2023-09-10 DIAGNOSIS — Z0000 Encounter for general adult medical examination without abnormal findings: Secondary | ICD-10-CM

## 2023-09-10 DIAGNOSIS — Z0001 Encounter for general adult medical examination with abnormal findings: Secondary | ICD-10-CM | POA: Diagnosis not present

## 2023-09-10 DIAGNOSIS — Z124 Encounter for screening for malignant neoplasm of cervix: Secondary | ICD-10-CM

## 2023-09-10 DIAGNOSIS — E7800 Pure hypercholesterolemia, unspecified: Secondary | ICD-10-CM

## 2023-09-10 DIAGNOSIS — E1165 Type 2 diabetes mellitus with hyperglycemia: Secondary | ICD-10-CM | POA: Diagnosis not present

## 2023-09-10 DIAGNOSIS — M25552 Pain in left hip: Secondary | ICD-10-CM

## 2023-09-10 LAB — C-REACTIVE PROTEIN: CRP: <1 mg/dL (ref 0.5–20.0)

## 2023-09-10 LAB — SEDIMENTATION RATE: Sed Rate: 3 mm/hr (ref 0–30)

## 2023-09-10 LAB — MICROALBUMIN / CREATININE URINE RATIO
Creatinine,U: 105.4 mg/dL
Microalb Creat Ratio: 1.9 mg/g (ref 0.0–30.0)
Microalb, Ur: 2 mg/dL — ABNORMAL HIGH (ref 0.0–1.9)

## 2023-09-10 NOTE — Assessment & Plan Note (Signed)
Physical today 09/10/23.  PAP 09/2020 - negative with negative HPV.  PAP today. Mammogram 02/03/23 - birads I.   Colonoscopy 02/13/21 - normal.  Recommended f/u in 10 years.

## 2023-09-10 NOTE — Progress Notes (Signed)
Subjective:    Patient ID: Rachel Collins, female    DOB: [birth year], 62 y.o.   MRN: [id number]  Patient here for  Chief Complaint  Patient presents with   Annual Exam    HPI Here for a physical exam. Should be on mevacor and zetia. Will confirm when gets home.  Tries to stay active.  No chest pain or sob reported. Does report increased pain- right lateral hip.  Notices increased pain - lying on side.  Left side sore now.  Aggravated last week.  Discussed bursitis.  Also, hands are worse.  Increased pain, stiffness and swelling - hands, MCPs.  Knees have also been an issue.  Increased stress.     Past Medical History:  Diagnosis Date   Diabetes mellitus without complication (HCC)    Esophagitis    Gastritis    Gastroparesis    History of migraine headaches    Hypercholesterolemia    Hyperglycemia    Hypertension    Paget's bone disease    Past Surgical History:  Procedure Laterality Date   BREAST BIOPSY Left 2010   benign   CERVICAL DISC SURGERY     C5-C7 fusion   COLONOSCOPY WITH PROPOFOL N/A 02/13/2021   Procedure: COLONOSCOPY WITH PROPOFOL;  Surgeon: Earline Mayotte, MD;  Location: [facility];  Service: Endoscopy;  Laterality: N/A;   ESOPHAGOGASTRODUODENOSCOPY (EGD) WITH PROPOFOL N/A 02/13/2021   Procedure: ESOPHAGOGASTRODUODENOSCOPY (EGD) WITH PROPOFOL;  Surgeon: Earline Mayotte, MD;  Location: [facility];  Service: Endoscopy;  Laterality: N/A;   TUBAL LIGATION     Family History  Problem Relation Age of Onset   Heart disease Father        myocardial infarction x 5   Prostate cancer Father    Uterine cancer Mother    COPD Mother    Diabetes Mother    Heart disease Mother        myocardial infarction   Breast cancer Mother 77   Heart disease Paternal Grandmother    Heart disease Maternal Grandfather    Social History   Socioeconomic History   Marital status: Married    Spouse name: Not on file   Number of children: 1   Years of education: Not  on file   Highest education level: Not on file  Occupational History   Not on file  Tobacco Use   Smoking status: Never   Smokeless tobacco: Never  Vaping Use   Vaping status: Never Used  Substance and Sexual Activity   Alcohol use: Yes    Alcohol/week: 7.0 standard drinks of alcohol    Types: 7 Glasses of wine per week    Comment: 1 glass with dinner   Drug use: No   Sexual activity: Not on file  Other Topics Concern   Not on file  Social History Narrative   Not on file   Social Determinants of Health   Financial Resource Strain: Not on file  Food Insecurity: Not on file  Transportation Needs: Not on file  Physical Activity: Not on file  Stress: Not on file  Social Connections: Not on file     Review of Systems  Constitutional:  Negative for appetite change and unexpected weight change.  HENT:  Negative for congestion, sinus pressure and sore throat.   Eyes:  Negative for pain and visual disturbance.  Respiratory:  Negative for cough, chest tightness and shortness of breath.   Cardiovascular:  Negative for chest pain and palpitations.  Gastrointestinal:  Negative for abdominal pain, diarrhea, nausea and vomiting.  Genitourinary:  Negative for difficulty urinating and dysuria.  Musculoskeletal:        Hip pain, hand pain and knee pain as outlined.   Skin:  Negative for color change and rash.  Neurological:  Negative for dizziness and headaches.  Hematological:  Negative for adenopathy. Does not bruise/bleed easily.  Psychiatric/Behavioral:  Negative for agitation and dysphoric mood.        Objective:     BP 138/72   Pulse 78   Temp 98.1 F (36.7 C) (Oral)   Ht 5\' 3"  (1.6 m)   Wt 153 lb (69.4 kg)   LMP  (LMP Unknown)   SpO2 98%   BMI 27.10 kg/m  Wt Readings from Last 3 Encounters:  09/10/23 153 lb (69.4 kg)  01/05/23 151 lb (68.5 kg)  11/04/22 149 lb 6.4 oz (67.8 kg)    Physical Exam Vitals reviewed.  Constitutional:      General: She is not in  acute distress.    Appearance: Normal appearance. She is well-developed.  HENT:     Head: Normocephalic and atraumatic.     Right Ear: External ear normal.     Left Ear: External ear normal.  Eyes:     General: No scleral icterus.       Right eye: No discharge.        Left eye: No discharge.     Conjunctiva/sclera: Conjunctivae normal.  Neck:     Thyroid: No thyromegaly.  Cardiovascular:     Rate and Rhythm: Normal rate and regular rhythm.  Pulmonary:     Effort: No tachypnea, accessory muscle usage or respiratory distress.     Breath sounds: Normal breath sounds. No decreased breath sounds or wheezing.  Chest:  Breasts:    Right: No inverted nipple, mass, nipple discharge or tenderness (no axillary adenopathy).     Left: No inverted nipple, mass, nipple discharge or tenderness (no axilarry adenopathy).  Abdominal:     General: Bowel sounds are normal.     Palpations: Abdomen is soft.     Tenderness: There is no abdominal tenderness.  Genitourinary:    Comments: Normal external genitalia.  Vaginal vault without lesions.  Cervix identified.  Pap smear performed.  Could not appreciate any adnexal masses or tenderness.   Musculoskeletal:        General: No swelling or tenderness.     Cervical back: Neck supple.  Lymphadenopathy:     Cervical: No cervical adenopathy.  Skin:    Findings: No erythema or rash.  Neurological:     Mental Status: She is alert and oriented to person, place, and time.  Psychiatric:        Mood and Affect: Mood normal.        Behavior: Behavior normal.      Outpatient Encounter Medications as of 09/10/2023  Medication Sig   amLODipine (NORVASC) 5 MG tablet TAKE 1 TABLET (5 MG TOTAL) BY MOUTH DAILY.   ezetimibe (ZETIA) 10 MG tablet Take 1 tablet (10 mg total) by mouth daily.   hydrochlorothiazide (HYDRODIURIL) 25 MG tablet TAKE 1 TABLET (25 MG TOTAL) BY MOUTH DAILY.   losartan (COZAAR) 100 MG tablet Take 1 tablet (100 mg total) by mouth daily.  Please Call office to schedule overdue follow-up soon   lovastatin (MEVACOR) 40 MG tablet TAKE 1 TABLET BY MOUTH EVERYDAY AT BEDTIME   meloxicam (MOBIC) 15 MG tablet SMARTSIG:1 Tablet(s) By Mouth Daily   No  facility-administered encounter medications on file as of 09/10/2023.     Lab Results  Component Value Date   WBC 5.7 09/19/2022   HGB 14.2 09/19/2022   HCT 42.9 09/19/2022   PLT 268.0 09/19/2022   GLUCOSE 114 (H) 09/09/2023   CHOL 208 (H) 09/09/2023   TRIG 164.0 (H) 09/09/2023   HDL 52.20 09/09/2023   LDLDIRECT 138.0 09/19/2022   LDLCALC 123 (H) 09/09/2023   ALT 28 09/09/2023   AST 20 09/09/2023   NA 139 09/09/2023   K 3.8 09/09/2023   CL 104 09/09/2023   CREATININE 0.75 09/09/2023   BUN 23 09/09/2023   CO2 27 09/09/2023   TSH 2.40 09/19/2022   HGBA1C 6.5 09/09/2023   MICROALBUR 2.0 (H) 09/10/2023    MM 3D SCREEN BREAST BILATERAL  Result Date: 02/05/2023 CLINICAL DATA:  Screening. EXAM: DIGITAL SCREENING BILATERAL MAMMOGRAM WITH TOMOSYNTHESIS AND CAD TECHNIQUE: Bilateral screening digital craniocaudal and mediolateral oblique mammograms were obtained. Bilateral screening digital breast tomosynthesis was performed. The images were evaluated with computer-aided detection. COMPARISON:  Previous exam(s). ACR Breast Density Category b: There are scattered areas of fibroglandular density. FINDINGS: There are no findings suspicious for malignancy. IMPRESSION: No mammographic evidence of malignancy. A result letter of this screening mammogram will be mailed directly to the patient. RECOMMENDATION: Screening mammogram in one year. (Code:SM-B-01Y) BI-RADS CATEGORY  1: Negative. Electronically Signed   By: Norva Pavlov M.D.   On: 02/05/2023 07:52       Assessment & Plan:  Routine general medical examination at a health care facility  Health care maintenance Assessment & Plan: Physical today 09/10/23.  PAP 09/2020 - negative with negative HPV.  PAP today. Mammogram 02/03/23 -  birads I.   Colonoscopy 02/13/21 - normal.  Recommended f/u in 10 years.     Hypercholesterolemia -     Lipid panel; Future -     Hepatic function panel; Future -     Basic metabolic panel; Future  Type 2 diabetes mellitus with hyperglycemia, without long-term current use of insulin (HCC) Assessment & Plan: Low carb diet and exercise.  Follow met b and a1c.   Orders: -     Hemoglobin A1c; Future -     Microalbumin / creatinine urine ratio  Screening for cervical cancer -     Cytology - PAP  Need for influenza vaccination -     Flu vaccine trivalent PF, 6mos and older(Flulaval,Afluria,Fluarix,Fluzone)  Arthralgia of both hands Assessment & Plan: Increased pain in multiple joints as outlined.  Increased pain, stiffness and swelling - hands. (MCPs).  Discussed further evaluation.  Check ESR, CRP and RF.  May need rheumatology evaluation.   Orders: -     Sedimentation rate -     Rheumatoid factor -     ANA -     C-reactive protein  Stress Assessment & Plan: Increased stress. Off zoloft. Follow.  Notify - if feels needs further intervention.    Paget disease of bone Assessment & Plan: Has been evaluated by endocrinology.  Recommended caclium and vitamin D.    Bilateral hip pain Assessment & Plan: Increased pain = lateral right hip as outlined.  Increased pain to palpation.  Increased pain with pressure applied- lying on that side, etc. Appears to be c/w trochanteric bursitis.  Now with left hip pain as outlined.  Stretches and conservative measures discussed.  Discussed f/u with ortho - question of need for injection.       Dale , MD

## 2023-09-11 ENCOUNTER — Encounter: Payer: Self-pay | Admitting: Internal Medicine

## 2023-09-12 ENCOUNTER — Encounter: Payer: Self-pay | Admitting: Internal Medicine

## 2023-09-12 LAB — RHEUMATOID FACTOR: Rheumatoid fact SerPl-aCnc: <10 IU/mL (ref <14)

## 2023-09-12 LAB — ANTI-NUCLEAR AB-TITER (ANA TITER): ANA Titer 1: 1:40 {titer} — ABNORMAL HIGH

## 2023-09-12 LAB — ANA: Anti Nuclear Antibody (ANA): POSITIVE — AB

## 2023-09-12 NOTE — Assessment & Plan Note (Signed)
Low carb diet and exercise.  Follow met b and a1c.  

## 2023-09-12 NOTE — Assessment & Plan Note (Signed)
Increased pain in multiple joints as outlined.  Increased pain, stiffness and swelling - hands. (MCPs).  Discussed further evaluation.  Check ESR, CRP and RF.  May need rheumatology evaluation.

## 2023-09-12 NOTE — Assessment & Plan Note (Signed)
Increased pain = lateral right hip as outlined.  Increased pain to palpation.  Increased pain with pressure applied- lying on that side, etc. Appears to be c/w trochanteric bursitis.  Now with left hip pain as outlined.  Stretches and conservative measures discussed.  Discussed f/u with ortho - question of need for injection.

## 2023-09-12 NOTE — Assessment & Plan Note (Signed)
Has been evaluated by endocrinology.  Recommended caclium and vitamin D.  ?

## 2023-09-12 NOTE — Assessment & Plan Note (Signed)
Increased stress. Off zoloft. Follow.  Notify - if feels needs further intervention.

## 2023-09-15 ENCOUNTER — Other Ambulatory Visit: Payer: Self-pay | Admitting: Internal Medicine

## 2023-09-15 DIAGNOSIS — M2550 Pain in unspecified joint: Secondary | ICD-10-CM

## 2023-09-15 LAB — CYTOLOGY - PAP
Comment: NEGATIVE
Diagnosis: NEGATIVE
High risk HPV: NEGATIVE

## 2023-09-15 NOTE — Progress Notes (Signed)
Order placed for rheumatology referral.

## 2023-09-16 ENCOUNTER — Encounter: Payer: Self-pay | Admitting: Internal Medicine

## 2023-09-16 ENCOUNTER — Other Ambulatory Visit: Payer: BC Managed Care – PPO

## 2023-09-16 NOTE — Telephone Encounter (Signed)
See other message

## 2023-09-16 NOTE — Telephone Encounter (Signed)
Rachel Collins- Can you resend referral?  Spoke with patient and discussed below. She would like to proceed with referral to Dr Allena Katz

## 2023-09-18 ENCOUNTER — Encounter: Payer: BC Managed Care – PPO | Admitting: Internal Medicine

## 2023-10-20 ENCOUNTER — Other Ambulatory Visit: Payer: BC Managed Care – PPO

## 2023-10-23 ENCOUNTER — Encounter: Payer: BC Managed Care – PPO | Admitting: Internal Medicine

## 2023-11-17 ENCOUNTER — Other Ambulatory Visit: Payer: Self-pay | Admitting: Internal Medicine

## 2024-01-11 ENCOUNTER — Other Ambulatory Visit: Payer: BC Managed Care – PPO

## 2024-01-13 ENCOUNTER — Ambulatory Visit: Payer: BC Managed Care – PPO | Admitting: Internal Medicine

## 2024-01-21 ENCOUNTER — Other Ambulatory Visit: Payer: Self-pay | Admitting: Emergency Medicine

## 2024-01-21 ENCOUNTER — Ambulatory Visit
Admission: RE | Admit: 2024-01-21 | Discharge: 2024-01-21 | Disposition: A | Discharge status: Home / Self Care | Payer: BC Managed Care – PPO | Source: Ambulatory Visit | Attending: Emergency Medicine | Admitting: Emergency Medicine

## 2024-01-21 DIAGNOSIS — M25551 Pain in right hip: Secondary | ICD-10-CM | POA: Diagnosis not present

## 2024-01-21 DIAGNOSIS — M25552 Pain in left hip: Secondary | ICD-10-CM | POA: Diagnosis not present

## 2024-01-21 DIAGNOSIS — M25559 Pain in unspecified hip: Secondary | ICD-10-CM

## 2024-02-16 ENCOUNTER — Other Ambulatory Visit: Payer: Self-pay

## 2024-02-16 DIAGNOSIS — M25551 Pain in right hip: Secondary | ICD-10-CM

## 2024-02-16 DIAGNOSIS — M25552 Pain in left hip: Secondary | ICD-10-CM

## 2024-02-20 ENCOUNTER — Other Ambulatory Visit: Payer: BC Managed Care – PPO

## 2024-02-25 ENCOUNTER — Other Ambulatory Visit: Payer: Self-pay | Admitting: Internal Medicine

## 2024-03-02 NOTE — Telephone Encounter (Signed)
Attempted to call Patient-no answer/voicemail is full 

## 2024-04-15 DIAGNOSIS — J018 Other acute sinusitis: Secondary | ICD-10-CM | POA: Diagnosis not present

## 2024-04-15 DIAGNOSIS — T63484A Toxic effect of venom of other arthropod, undetermined, initial encounter: Secondary | ICD-10-CM | POA: Diagnosis not present

## 2024-04-15 DIAGNOSIS — J029 Acute pharyngitis, unspecified: Secondary | ICD-10-CM | POA: Diagnosis not present

## 2024-04-15 DIAGNOSIS — I10 Essential (primary) hypertension: Secondary | ICD-10-CM | POA: Diagnosis not present

## 2024-10-06 ENCOUNTER — Other Ambulatory Visit: Payer: Self-pay | Admitting: Internal Medicine

## 2024-10-06 DIAGNOSIS — Z1231 Encounter for screening mammogram for malignant neoplasm of breast: Secondary | ICD-10-CM

## 2024-11-07 ENCOUNTER — Encounter
